# Patient Record
Sex: Female | Born: 1965 | Race: Black or African American | Hispanic: No | Marital: Single | Smoking: Current every day smoker
Health system: Southern US, Community
[De-identification: ages and names within clinical notes are randomized; demographics above are authoritative.]

## PROBLEM LIST (undated history)

## (undated) DIAGNOSIS — R0789 Other chest pain: Secondary | ICD-10-CM

## (undated) DIAGNOSIS — I447 Left bundle-branch block, unspecified: Secondary | ICD-10-CM

## (undated) DIAGNOSIS — I10 Essential (primary) hypertension: Secondary | ICD-10-CM

## (undated) DIAGNOSIS — Z91199 Patient's noncompliance with other medical treatment and regimen due to unspecified reason: Secondary | ICD-10-CM

## (undated) DIAGNOSIS — I471 Supraventricular tachycardia: Principal | ICD-10-CM

## (undated) DIAGNOSIS — F419 Anxiety disorder, unspecified: Secondary | ICD-10-CM

## (undated) DIAGNOSIS — E785 Hyperlipidemia, unspecified: Secondary | ICD-10-CM

## (undated) DIAGNOSIS — R7989 Other specified abnormal findings of blood chemistry: Secondary | ICD-10-CM

## (undated) DIAGNOSIS — Z9119 Patient's noncompliance with other medical treatment and regimen: Secondary | ICD-10-CM

## (undated) DIAGNOSIS — I201 Angina pectoris with documented spasm: Secondary | ICD-10-CM

## (undated) HISTORY — DX: Patient's noncompliance with other medical treatment and regimen: Z91.19

## (undated) HISTORY — DX: Anxiety disorder, unspecified: F41.9

## (undated) HISTORY — DX: Patient's noncompliance with other medical treatment and regimen due to unspecified reason: Z91.199

## (undated) HISTORY — DX: Hyperlipidemia, unspecified: E78.5

## (undated) HISTORY — DX: Other chest pain: R07.89

## (undated) HISTORY — DX: Essential (primary) hypertension: I10

## (undated) HISTORY — PX: TUBAL LIGATION: SHX77

## (undated) HISTORY — DX: Left bundle-branch block, unspecified: I44.7

## (undated) HISTORY — DX: Supraventricular tachycardia: I47.1

---

## 1997-09-07 ENCOUNTER — Emergency Department (HOSPITAL_COMMUNITY): Admission: EM | Admit: 1997-09-07 | Discharge: 1997-09-07 | Payer: Self-pay | Admitting: Emergency Medicine

## 2004-01-16 ENCOUNTER — Ambulatory Visit: Payer: Self-pay | Admitting: Family Medicine

## 2004-02-05 ENCOUNTER — Ambulatory Visit: Payer: Self-pay | Admitting: Family Medicine

## 2004-03-15 ENCOUNTER — Ambulatory Visit: Payer: Self-pay | Admitting: *Deleted

## 2004-05-09 ENCOUNTER — Emergency Department (HOSPITAL_COMMUNITY): Admission: EM | Admit: 2004-05-09 | Discharge: 2004-05-09 | Payer: Self-pay | Admitting: Emergency Medicine

## 2004-09-23 ENCOUNTER — Ambulatory Visit: Payer: Self-pay | Admitting: Internal Medicine

## 2004-10-03 ENCOUNTER — Ambulatory Visit: Payer: Self-pay | Admitting: Internal Medicine

## 2005-01-29 ENCOUNTER — Ambulatory Visit: Payer: Self-pay | Admitting: Family Medicine

## 2005-02-26 ENCOUNTER — Ambulatory Visit: Payer: Self-pay | Admitting: Family Medicine

## 2005-02-27 ENCOUNTER — Encounter (INDEPENDENT_AMBULATORY_CARE_PROVIDER_SITE_OTHER): Payer: Self-pay | Admitting: Family Medicine

## 2005-02-27 LAB — CONVERTED CEMR LAB

## 2005-03-26 ENCOUNTER — Ambulatory Visit (HOSPITAL_COMMUNITY): Admission: RE | Admit: 2005-03-26 | Discharge: 2005-03-26 | Payer: Self-pay | Admitting: Family Medicine

## 2005-07-13 ENCOUNTER — Emergency Department (HOSPITAL_COMMUNITY): Admission: EM | Admit: 2005-07-13 | Discharge: 2005-07-13 | Payer: Self-pay | Admitting: Emergency Medicine

## 2005-09-22 ENCOUNTER — Inpatient Hospital Stay (HOSPITAL_COMMUNITY): Admission: EM | Admit: 2005-09-22 | Discharge: 2005-09-23 | Payer: Self-pay | Admitting: Emergency Medicine

## 2005-09-22 ENCOUNTER — Ambulatory Visit: Payer: Self-pay | Admitting: Family Medicine

## 2005-09-25 ENCOUNTER — Ambulatory Visit: Payer: Self-pay | Admitting: Family Medicine

## 2005-12-05 ENCOUNTER — Ambulatory Visit: Payer: Self-pay | Admitting: Family Medicine

## 2006-02-23 ENCOUNTER — Ambulatory Visit: Payer: Self-pay | Admitting: Family Medicine

## 2006-05-06 ENCOUNTER — Ambulatory Visit: Payer: Self-pay | Admitting: Family Medicine

## 2006-09-23 ENCOUNTER — Ambulatory Visit: Payer: Self-pay | Admitting: Internal Medicine

## 2006-11-13 ENCOUNTER — Emergency Department (HOSPITAL_COMMUNITY): Admission: EM | Admit: 2006-11-13 | Discharge: 2006-11-13 | Payer: Self-pay | Admitting: Emergency Medicine

## 2006-12-09 ENCOUNTER — Encounter (INDEPENDENT_AMBULATORY_CARE_PROVIDER_SITE_OTHER): Payer: Self-pay | Admitting: *Deleted

## 2007-01-22 ENCOUNTER — Encounter (INDEPENDENT_AMBULATORY_CARE_PROVIDER_SITE_OTHER): Payer: Self-pay | Admitting: Family Medicine

## 2007-01-22 DIAGNOSIS — I10 Essential (primary) hypertension: Secondary | ICD-10-CM | POA: Insufficient documentation

## 2007-01-22 DIAGNOSIS — D509 Iron deficiency anemia, unspecified: Secondary | ICD-10-CM | POA: Insufficient documentation

## 2007-01-22 DIAGNOSIS — N739 Female pelvic inflammatory disease, unspecified: Secondary | ICD-10-CM | POA: Insufficient documentation

## 2007-01-22 DIAGNOSIS — B75 Trichinellosis: Secondary | ICD-10-CM

## 2007-01-22 DIAGNOSIS — J45909 Unspecified asthma, uncomplicated: Secondary | ICD-10-CM | POA: Insufficient documentation

## 2007-04-23 ENCOUNTER — Ambulatory Visit: Payer: Self-pay | Admitting: Internal Medicine

## 2007-04-23 LAB — CONVERTED CEMR LAB
ALT: 14 units/L (ref 0–35)
AST: 18 units/L (ref 0–37)
Alkaline Phosphatase: 68 units/L (ref 39–117)
Basophils Absolute: 0.1 10*3/uL (ref 0.0–0.1)
CO2: 27 meq/L (ref 19–32)
Chloride: 103 meq/L (ref 96–112)
Creatinine, Ser: 0.88 mg/dL (ref 0.40–1.20)
HCT: 39.6 % (ref 36.0–46.0)
Helicobacter Pylori Antibody-IgG: <0.4
Hemoglobin: 12.3 g/dL (ref 12.0–15.0)
Lymphocytes Relative: 24 % (ref 12–46)
Monocytes Relative: 8 % (ref 3–12)
Neutrophils Relative %: 64 % (ref 43–77)
Platelets: 344 10*3/uL (ref 150–400)
RDW: 16.7 % — ABNORMAL HIGH (ref 11.5–15.5)
Total Bilirubin: 0.2 mg/dL — ABNORMAL LOW (ref 0.3–1.2)

## 2007-05-19 ENCOUNTER — Encounter (INDEPENDENT_AMBULATORY_CARE_PROVIDER_SITE_OTHER): Payer: Self-pay | Admitting: Family Medicine

## 2007-05-19 ENCOUNTER — Ambulatory Visit: Payer: Self-pay | Admitting: Internal Medicine

## 2007-05-19 LAB — CONVERTED CEMR LAB
Calcium: 9.5 mg/dL (ref 8.4–10.5)
Chloride: 102 meq/L (ref 96–112)

## 2007-06-02 ENCOUNTER — Ambulatory Visit: Payer: Self-pay | Admitting: Internal Medicine

## 2007-06-13 ENCOUNTER — Emergency Department (HOSPITAL_COMMUNITY): Admission: EM | Admit: 2007-06-13 | Discharge: 2007-06-13 | Payer: Self-pay | Admitting: Emergency Medicine

## 2007-06-16 ENCOUNTER — Ambulatory Visit: Payer: Self-pay | Admitting: Internal Medicine

## 2008-02-09 ENCOUNTER — Emergency Department (HOSPITAL_COMMUNITY): Admission: EM | Admit: 2008-02-09 | Discharge: 2008-02-09 | Payer: Self-pay | Admitting: Emergency Medicine

## 2009-04-05 ENCOUNTER — Ambulatory Visit: Payer: Self-pay | Admitting: Internal Medicine

## 2009-10-23 ENCOUNTER — Ambulatory Visit: Payer: Self-pay | Admitting: Internal Medicine

## 2009-10-23 LAB — CONVERTED CEMR LAB
AST: 27 units/L (ref 0–37)
Alkaline Phosphatase: 84 units/L (ref 39–117)
CO2: 25 meq/L (ref 19–32)
Chloride: 103 meq/L (ref 96–112)
Creatinine, Ser: 0.79 mg/dL (ref 0.40–1.20)
Glucose, Bld: 101 mg/dL — ABNORMAL HIGH (ref 70–99)
Potassium: 3.3 meq/L — ABNORMAL LOW (ref 3.5–5.3)
Sodium: 139 meq/L (ref 135–145)
TSH: 3.634 microintl units/mL (ref 0.350–4.500)
Total Bilirubin: 0.4 mg/dL (ref 0.3–1.2)

## 2009-10-25 ENCOUNTER — Ambulatory Visit: Payer: Self-pay | Admitting: Internal Medicine

## 2009-10-26 ENCOUNTER — Ambulatory Visit: Payer: Self-pay | Admitting: Internal Medicine

## 2009-11-19 ENCOUNTER — Inpatient Hospital Stay (HOSPITAL_COMMUNITY): Admission: EM | Admit: 2009-11-19 | Discharge: 2009-11-21 | Payer: Self-pay | Admitting: Emergency Medicine

## 2009-12-18 ENCOUNTER — Encounter (INDEPENDENT_AMBULATORY_CARE_PROVIDER_SITE_OTHER): Payer: Self-pay | Admitting: Internal Medicine

## 2009-12-18 LAB — CONVERTED CEMR LAB
BUN: 18 mg/dL (ref 6–23)
Calcium: 9.3 mg/dL (ref 8.4–10.5)
Chloride: 104 meq/L (ref 96–112)
Creatinine, Ser: 1.02 mg/dL (ref 0.40–1.20)
Sodium: 139 meq/L (ref 135–145)

## 2010-02-18 ENCOUNTER — Emergency Department (HOSPITAL_COMMUNITY): Admission: EM | Admit: 2010-02-18 | Discharge: 2010-02-18 | Payer: Self-pay | Admitting: Emergency Medicine

## 2010-06-07 LAB — BASIC METABOLIC PANEL
BUN: 14 mg/dL (ref 6–23)
CO2: 23 mEq/L (ref 19–32)
CO2: 24 mEq/L (ref 19–32)
Calcium: 9 mg/dL (ref 8.4–10.5)
GFR calc Af Amer: >60 mL/min (ref >60)
GFR calc Af Amer: >60 mL/min (ref >60)
GFR calc non Af Amer: >60 mL/min (ref >60)
GFR calc non Af Amer: >60 mL/min (ref >60)
GFR calc non Af Amer: >60 mL/min (ref >60)
Glucose, Bld: 107 mg/dL — ABNORMAL HIGH (ref 70–99)
Glucose, Bld: 92 mg/dL (ref 70–99)
Potassium: 2.9 mEq/L — ABNORMAL LOW (ref 3.5–5.1)
Sodium: 137 mEq/L (ref 135–145)
Sodium: 139 mEq/L (ref 135–145)

## 2010-06-07 LAB — DIFFERENTIAL
Band Neutrophils: 0 % (ref 0–10)
Blasts: 0 %
Eosinophils Relative: 2 % (ref 0–5)
Lymphocytes Relative: 21 % (ref 12–46)
Lymphs Abs: 1.4 10*3/uL (ref 0.7–4.0)
Monocytes Relative: 5 % (ref 3–12)
Promyelocytes Absolute: 0 %

## 2010-06-07 LAB — URINALYSIS, ROUTINE W REFLEX MICROSCOPIC
Bilirubin Urine: NEGATIVE
Ketones, ur: NEGATIVE mg/dL
Nitrite: NEGATIVE
Protein, ur: NEGATIVE mg/dL
Specific Gravity, Urine: 1.012 (ref 1.005–1.030)
Urobilinogen, UA: 0.2 mg/dL (ref 0.0–1.0)
pH: 7 (ref 5.0–8.0)

## 2010-06-07 LAB — CBC
HCT: 30 % — ABNORMAL LOW (ref 36.0–46.0)
Hemoglobin: 10.7 g/dL — ABNORMAL LOW (ref 12.0–15.0)
Hemoglobin: 9.5 g/dL — ABNORMAL LOW (ref 12.0–15.0)
MCH: 21.8 pg — ABNORMAL LOW (ref 26.0–34.0)
MCV: 69.4 fL — ABNORMAL LOW (ref 78.0–100.0)
Platelets: 222 10*3/uL (ref 150–400)
Platelets: 302 10*3/uL (ref 150–400)
RDW: 19 % — ABNORMAL HIGH (ref 11.5–15.5)
RDW: 19.1 % — ABNORMAL HIGH (ref 11.5–15.5)
WBC: 5.2 10*3/uL (ref 4.0–10.5)

## 2010-08-09 NOTE — Discharge Summary (Signed)
[name], [name] NO.:  [id number]   MEDICAL RECORD NO.:  [id number]          PATIENT TYPE:  INP   LOCATION:  4733                         FACILITY:  [facility]   PHYSICIAN:  Santiago Bumpers. Hensel, M.D.DATE OF BIRTH:  03-Aug-1965   DATE OF ADMISSION:  09/22/2005  DATE OF DISCHARGE:  09/23/2005                                 DISCHARGE SUMMARY   DISCHARGE DIAGNOSES:  1.  Epistaxis.  2.  Uncontrolled hypertension secondary noncompliance.  3.  Hyperkalemia.  4.  Seasonal allergies.  5.  Gastroesophageal reflux disease.   BRIEF HISTORY AND PHYSICAL:  This is a 45 year old African American female  with a history of hypertension and noncompliance with her medication who  presented with spontaneous left-sided epistaxis.  Her blood pressure in the  emergency room was 234/133 at presentation.  She was given clonidine and  labetalol over a 4 hour period and her blood pressure decreased to 180/102  and her nose was packed, at which point her nosebleed decreased to a slow  ooze and was subsequently packed.  She was admitted for 24 hour observation  for better blood pressure control and to ensure hemostasis of her nosebleed.   CONSULTATIONS:  None.   PROCEDURES:  Nasal packing in the emergency department.   PERTINENT LABS AT ADMISSION:  Hemoglobin was 9.2, hematocrit was 30.2,  sodium was 139, potassium 3, chloride 107, bicarb 24, BUN 12, creatinine  0.8, glucose 93, calcium 8.9.  Coags were within normal limits.   INSTRUCTIONS TO THE PATIENT:  No restrictions were given to her on diet or  activity, but it was stressed to her the importance that she follow up with  Dr. Margarette Canada at [facility] and especially follow up for her eligibility  renewal appointment on July 9 at 12:15 p.m.  She is to follow up with Dr.  Margarette Canada at 10:45 a.m. on Thursday the 5th.  It was also stressed to her the  importance of keeping her blood pressure under control with her blood  pressure  medications.   DISCHARGE MEDICATIONS:  1.  Norvasc 5 mg p.o. daily.  2.  Hydrochlorothiazide 25 mg p.o. daily.  3.  Allegra 180 mg p.o. daily.  4.  Protonix 40 mg p.o. daily.  5.  Nasonex as directed on the bottle.   HOSPITAL COURSE BY PROBLEM:  1.  Epistaxis.  Patient's nose was packed in the emergency department and      left in 24 hours.  It was removed 3 hours prior to discharge without      incident or further bleeding.  Patient was instructed about the      importance of her blood pressure management.  2.  Hypertension, poor controlled blood pressure secondary to financial      reasons.  Patient was not taking her home medications which included      clonidine, metoprolol, and hydrochlorothiazide for one month.  She was      given clonidine and labetalol in the ED, which decreased her blood      pressure from 234/133 to 180/102.  She was continued on her  home regimen      on the floor with blood pressures in the 140s/80s on the day of      discharge.  With such a quick response to her home regimen, I suspect      her poorly controlled status is mostly due to not taking her meds and      given the rebound that can be caused by clonidine, we opted to change      her home regimen to Norvasc and hydrochlorothiazide, which will be      cheaper for her and are both on the formulary at [facility].  The      importance of her blood pressure control was once again stressed to her      and appointments were made for her with [facility] for follow up.  3.  Hypokalemia.  Asymptomatic hypokalemia.  She was repleted with 200 mEq      total of K-Dur p.o. to increase her potassium from 3 to 3.5.  She will      need to have her potassium rechecked at her followup later this week.     ______________________________  Lupita Raider, M.D.    ______________________________  Santiago Bumpers. Leveda Anna, M.D.    KS/MEDQ  D:  09/23/2005  T:  09/24/2005  Job:  [id number]

## 2010-08-09 NOTE — H&P (Signed)
NAMEMarland Kitchen  [name], [name] NO.:  [id number]   MEDICAL RECORD NO.:  [id number]          PATIENT TYPE:  INP   LOCATION:  4733                         FACILITY:  [facility]   PHYSICIAN:  Levander Campion, M.D.  DATE OF BIRTH:  [birth year]   DATE OF ADMISSION:  09/22/2005  DATE OF DISCHARGE:                                HISTORY & PHYSICAL   SERVICE:  Family practice teaching service.   CHIEF COMPLAINT:  A spontaneous nosebleed secondary to hypertensive crisis.   HISTORY OF PRESENT ILLNESS:  [name] is a 45 year old female with history  of hypertension who presents with spontaneous left-sided epistaxis that  began at 4:00 p.m. on September 22, 2005.  The bleeding first began as a trickle  and then became a continuous stream.  The patient was otherwise asymptomatic  at the time.  The patient denies headache, chest pain, blurry vision, or  nausea and vomiting.  The patient was given clonidine and labetalol at the  [facility] emergency department secondary to having a blood pressure on  admission of 234/133.  The patient's blood pressure dropped over the course  of 4 hours to 180/102, and her nosebleed decreased back to a slow ooze.  In  the ED, nasal packing was applied.  The patient had not been taking her  blood pressure medications for one month secondary to financial problems.   PAST MEDICAL HISTORY:  1.  Hypertension  2.  Seasonal allergies.  3.  GERD.   MEDICATIONS:  1.  Clonidine 0.2 mg p.o. b.i.d.  2.  Metoprolol 50 mg p.o. b.i.d.  3.  HCTZ 25 mg p.o. daily.  4.  Allegra 180 mg p.o. daily.  5.  Protonix 40 mg p.o. daily.  6.  Nasonex p.r.n.   ALLERGIES:  PENICILLIN.   SOCIAL HISTORY:  The patient lives in [place] with her daughter and  grandson.  She works as a Tour manager.  [name] is her  primary care [name] at [facility].  The patient denies tobacco  use.  The patient reports drinking one beer on week days but does admit to  binge drinking on weekends.  Denies illicit drug use.   FAMILY HISTORY:  Mother is deceased from hypertension and has had four to  six strokes.  Father's history is unknown.  Paternal grandparents are both  deceased and had hypertension and MIs.  She has a brother who died at 20 of  a heart condition.   REVIEW OF SYSTEMS:  CONSTITUTIONAL:  No fever, no chills, no weight loss, no  dizziness.  HEENT:  Positive sinus pressure on left side has been present  for months and is unchanged today.  Positive nasal congestion.  No sore  throat.  No visual symptoms.  SKIN:  No rash.  CARDIOVASCULAR:  No chest  pain, no edema.  PULMONARY:  No shortness of breath, no cough.  ABDOMEN:  No  nausea, vomiting, constipation, diarrhea, abdominal pain, or changes in  bowel habits.  EXTREMITIES:  No edema and no musculoskeletal pain.  NEURO:  No weakness.   PHYSICAL  EXAMINATION:  VITALS:  Temperature 99.1, pulse is 98, blood  pressure is 162/88, respirations 20, sats 99% on room air.  GENERAL:  Anxious, obese female.  HEENT:  Pupils equally round and reactive to light and accommodation.  Extraocular muscles intact.  Sclerae clear.  Nares:  Anterior left naris  mucosa with slight trickle of blood.  Bleeding appears to be very  superficial.  There was no drainage to the mouth.  Oropharynx is clear with  no blood present.  NECK:  Supple and nontender.  Thyroid within normal limits.  CARDIOVASCULAR:  Regular rate and rhythm with no murmurs, rubs, or gallops.  No lower extremity edema.  Two plus distal pulses.  PULMONARY:  Clear to auscultation bilaterally with no wheezes, rales or  retractions.  ABDOMEN:  Soft, nontender, and nondistended.  Positive bowel sounds.  No  rebound, no guarding.  EXTREMITIES:  No edema.  Strength 5 out of 5 in all 4 extremities.  NEUROLOGIC:  Cranial nerves II-XII intact.  No weakness.  Sensation is  intact bilaterally.  DTRs 2+ bilaterally.   LABORATORIES:  PT was within normal  limits at 13.6, INR 1, PTT 30.  Point of  care labs:  Sodium 139, potassium 3, chloride 105, BUN 16, glucose 83.  Hemoglobin 13.3, hematocrit 39.  Creatinine 1.   ASSESSMENT/PLAN:  [name] is a 45 year old female with history of  uncontrolled hypertension who presents with a hypertensive crisis and  spontaneous nosebleed.  1.  Hypertension.  Her pressure is down in the ED on admission to 162/88      after several doses of labetalol and 1 dose of clonidine.  We plan to      restart her home meds and observe her pressure.  We will consult social      work for help with her medication costs.  We will check a UA, a complete      metabolic panel, fasting lipid panel, and a chest x-ray in the morning.  2.  Nosebleed.  Nasal packing was applied in the ED.  We will leave it in      for 24 to 48 hours.  We will check CBC in the a.m.  3.  Decreased potassium.  Replaced in the ED, but we will recheck CMET in      the a.m.  4.  Prophylaxis.  Because the patient has nasal packing in place, we will      start amoxicillin 500 mg q.12 hours x 3 days and will place the patient      on Protonix 40 mg p.o. daily.           ______________________________  Levander Campion, M.D.     JH/MEDQ  D:  09/23/2005  T:  09/23/2005  Job:  [id number]

## 2010-10-22 ENCOUNTER — Emergency Department (HOSPITAL_COMMUNITY): Payer: Self-pay

## 2010-10-22 ENCOUNTER — Observation Stay (HOSPITAL_COMMUNITY)
Admission: EM | Admit: 2010-10-22 | Discharge: 2010-10-24 | Disposition: A | Discharge status: Home / Self Care | Payer: Self-pay | Attending: Cardiology | Admitting: Cardiology

## 2010-10-22 DIAGNOSIS — Z9119 Patient's noncompliance with other medical treatment and regimen: Secondary | ICD-10-CM | POA: Insufficient documentation

## 2010-10-22 DIAGNOSIS — Z91199 Patient's noncompliance with other medical treatment and regimen due to unspecified reason: Secondary | ICD-10-CM | POA: Insufficient documentation

## 2010-10-22 DIAGNOSIS — R0602 Shortness of breath: Secondary | ICD-10-CM | POA: Insufficient documentation

## 2010-10-22 DIAGNOSIS — I447 Left bundle-branch block, unspecified: Secondary | ICD-10-CM | POA: Insufficient documentation

## 2010-10-22 DIAGNOSIS — I4892 Unspecified atrial flutter: Secondary | ICD-10-CM | POA: Insufficient documentation

## 2010-10-22 DIAGNOSIS — I471 Supraventricular tachycardia, unspecified: Secondary | ICD-10-CM

## 2010-10-22 DIAGNOSIS — E785 Hyperlipidemia, unspecified: Secondary | ICD-10-CM | POA: Insufficient documentation

## 2010-10-22 DIAGNOSIS — I119 Hypertensive heart disease without heart failure: Principal | ICD-10-CM | POA: Insufficient documentation

## 2010-10-22 HISTORY — DX: Supraventricular tachycardia, unspecified: I47.10

## 2010-10-22 HISTORY — DX: Supraventricular tachycardia: I47.1

## 2010-10-22 LAB — DIFFERENTIAL
Eosinophils Absolute: 0.4 10*3/uL (ref 0.0–0.7)
Lymphocytes Relative: 25 % (ref 12–46)
Lymphs Abs: 2.6 10*3/uL (ref 0.7–4.0)
Monocytes Absolute: 0.9 10*3/uL (ref 0.1–1.0)
Neutro Abs: 6.4 10*3/uL (ref 1.7–7.7)
Neutrophils Relative %: 62 % (ref 43–77)

## 2010-10-22 LAB — BASIC METABOLIC PANEL
BUN: 20 mg/dL (ref 6–23)
CO2: 22 mEq/L (ref 19–32)
Calcium: 10.2 mg/dL (ref 8.4–10.5)
Glucose, Bld: 121 mg/dL — ABNORMAL HIGH (ref 70–99)
Potassium: 3.3 mEq/L — ABNORMAL LOW (ref 3.5–5.1)
Sodium: 137 mEq/L (ref 135–145)

## 2010-10-22 LAB — CBC
MCHC: 33.5 g/dL (ref 30.0–36.0)
RBC: 5.03 MIL/uL (ref 3.87–5.11)

## 2010-10-22 LAB — CK TOTAL AND CKMB (NOT AT ARMC)
CK, MB: 2.2 ng/mL (ref 0.3–4.0)
Relative Index: INVALID (ref 0.0–2.5)
Total CK: 71 U/L (ref 7–177)

## 2010-10-23 LAB — CK TOTAL AND CKMB (NOT AT ARMC)
CK, MB: 2.2 ng/mL (ref 0.3–4.0)
Relative Index: INVALID (ref 0.0–2.5)

## 2010-10-23 LAB — HEMOGLOBIN A1C
Hgb A1c MFr Bld: 5.5 % (ref <5.7)
Mean Plasma Glucose: 111 mg/dL (ref <117)

## 2010-10-23 LAB — LIPID PANEL
LDL Cholesterol: 137 mg/dL — ABNORMAL HIGH (ref 0–99)
VLDL: 26 mg/dL (ref 0–40)

## 2010-10-23 LAB — TSH: TSH: 5.665 u[IU]/mL — ABNORMAL HIGH (ref 0.350–4.500)

## 2010-10-23 LAB — CARDIAC PANEL(CRET KIN+CKTOT+MB+TROPI)
CK, MB: 2.5 ng/mL (ref 0.3–4.0)
Total CK: 64 U/L (ref 7–177)
Troponin I: <0.3 ng/mL (ref <0.30)

## 2010-10-24 LAB — BASIC METABOLIC PANEL
Calcium: 9.8 mg/dL (ref 8.4–10.5)
Creatinine, Ser: 1 mg/dL (ref 0.50–1.10)
GFR calc Af Amer: >60 mL/min (ref >60)
GFR calc non Af Amer: 60 mL/min — ABNORMAL LOW (ref >60)

## 2010-11-06 ENCOUNTER — Encounter: Payer: Self-pay | Admitting: *Deleted

## 2010-11-06 NOTE — Discharge Summary (Signed)
[name], [name] NO.:  [id number]  MEDICAL RECORD NO.:  [id number]  LOCATION:  3729                         FACILITY:  [facility]  PHYSICIAN:  Pamella Pert, MD DATE OF BIRTH:  1965-07-06  DATE OF ADMISSION:  10/22/2010 DATE OF DISCHARGE:  10/24/2010                              DISCHARGE SUMMARY   DISCHARGE DIAGNOSES: 1. Hypertension with hypertensive heart disease. 2. 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 3. Left bundle-branch block.  RECOMMENDATIONS:  The patient will be discharged home today.  DISCHARGE MEDICATIONS: 1. Lisinopril 40 mg p.o. daily. 2. Iron sulfate 325 mg p.o. daily. 3. Chlorthalidone 25 mg p.o. daily. 4. Coreg 6.25 mg p.o. b.i.d. 5. Aldactone 25 mg p.o. daily. 6. Aspirin 81 mg p.o. daily. 7. Verapamil SR 240 mg p.o. daily.  She will follow up with me in 2 weeks in the office.  BRIEF HISTORY:  Ms. [name] is a 45 year old female who has longstanding history of hypertension but has been noncompliant with medication.  She was admitted via emergency department when she presented with complaints of palpitations.  This was associated with chest discomfort and shortness of breath.  On presentation to the emergency department, she was found to be in atrial flutter with 2:1 conduction, which spontaneously reverted back to sinus rhythm.  She was ruled out for myocardial infarction.  She remained stable and had no more recurrence of atrial flutter.  I felt that the atrial flutter with rapid ventricular response was related to uncontrolled hypertension and hypertensive heart disease.  LABORATORY DATA:  Performed in the hospital included a BMP which revealed serum creatinine to be normal with a BUN of 19, creatinine of 1.0.  Cardiac markers were negative for myocardial injury.  TSH was slightly elevated at 5.66.  She will need followup of the same. Hemoglobin  A1c was normal at 5.5.  Her lipid profile revealed total cholesterol 224, triglycerides 131, HDL 61, LDL was 137.  An echocardiogram that was done October 23, 2010, revealed low normal LV systolic function.  The left ventricular cavity size in the upper limit of normal.  There is severe left ventricular hypertrophy with grade 2 diastolic dysfunction.  Left atrium was moderately dilated.  No significant valve abnormalities were detected.  On the day of discharge, she was felt to be stable with potassium being 3.3, which is being replaced prior to her discharge.  On the day of discharge, she was felt to be stayed stable with temperature of 97.9, pulse was 74 beats per minutes, respirations 14, blood pressure 138/93 mmHg.  RECOMMENDATIONS:  The patient advised to follow up with me in 2 weeks. She is also advised to follow up with [facility], which she has been following.  She needs a TSH reevaluation in about a month or 2 along with followup of her blood pressure.  I will be happy to see her back in the office in couple weeks for better control of hypertension.  DISCHARGE DIAGNOSES: 1. Atrial flutter with 2:1 conduction, which resolved without any     recurrence and presently stable on medications. 2. Hypertension, uncontrolled with hypertensive heart  disease without     any congestive heart failure. 3. Hyperlipidemia. 4. Abnormal TSH at 5.66.     Pamella Pert, MD     JRG/MEDQ  D:  10/24/2010  T:  10/24/2010  Job:  [id number]  Electronically Signed by Yates Decamp MD on 11/06/2010 06:39:40 PM

## 2010-11-06 NOTE — H&P (Signed)
[name], [name] NO.:  [id number]  MEDICAL RECORD NO.:  [id number]  LOCATION:  3729                         FACILITY:  [facility]  PHYSICIAN:  Pamella Pert, MD DATE OF BIRTH:  04/12/1965  DATE OF ADMISSION:  10/22/2010 DATE OF DISCHARGE:                             HISTORY & PHYSICAL   ADMISSION DIAGNOSES: 1. Atrial flutter with 2:1 conduction. 2. Palpitations secondary to atrial flutter with 2:1 conduction due to     uncontrolled hypertension. 3. Obesity. 4. Left bundle-branch block.  HISTORY:  Ms. [name] is a 45 year old female with history of noncompliance with medications with multiple admissions in the past for hypertension and hypertensive emergency, who presented to the [facility] Emergency Department complaining of palpitations.  History dates back to greater than a year ago where she noted occasional palpitations.  This was in the form of rapid heartbeat, associated chest discomfort,shortness of breath but would last only for a minute or two, but, however, over the last 2 weeks, she has been having these episodes on a daily basis.  Now, they have been lasting for about 15-20 minutes up to 30 minutes; however, they used to subside.  Yesterday, she had about 2-3 episodes lasting about 15-20 minutes each and 1 episode last almost about 30 minutes.  She thought she was having panic attacks.  This evening when she was sitting at home, she had similar episode where her heart started to raise and she felt chest tightness just similar to the symptoms that she has been having over the last 2 weeks associated with shortness of breath.  This lasted for at least greater than a couple of hours.  She presented to the emergency room because of persistent palpitations and chest discomfort.  In the emergency department, she was found to have wide complex tachycardia.  She spontaneously converted back to regular rhythm and felt better.  I have been consulted to  see her.  Presently, the patient states she feels well and is back to her baseline.  She denies any chest pain, shortness of breath, paroxysmal nocturnal dyspnea, or orthopnea.  No associated symptoms of diaphoresis, nausea, vomiting which is episodes of palpitation, chest discomfort, and shortness of breath.  REVIEW OF SYSTEMS:  She is not a diabetic but has been told that she has hyperglycemia without diagnosis of diabetes.  She has morbid obesity. She has uncontrolled hypertension, has been noncompliant with medications.  No bowel or bladder disturbance.  No neurological weakness.  No headache, nausea, vomiting, or syncope.  No visual disturbances.  Other systems negative.  Her home medications include: 1. Lisinopril 40 mg p.o. daily. 2. Chlorthalidone 25 mg p.o. daily. 3. Iron sulfate 325 mg p.o. daily.  ALLERGIES:  PENICILLIN which causes her to have rash and hives.  Past medical history is significant for: 1. Hypertension. 2. Obesity. 3. Hyperglycemia.  SOCIAL HISTORY:  She is single.  She does not smoke tobacco.  She has been drinking about half a pint of liquor on a daily basis for the last year or more, quit about a week ago.  No history of illicit drug use.  FAMILY HISTORY:  There is no history of premature coronary  artery disease in family.  Mother did have stroke at the age of 60 due to uncontrolled hypertension.  Father is alive and healthy.  No history of diabetes in family.  PHYSICAL EXAMINATION:  GENERAL:  She is moderately built and obese.  She appears to be in no acute distress. VITAL SIGNS:  Temperature of 97.6, pulse was 82 beats per minute and regular, respiration was 14, blood pressure 136/98 mmHg. CARDIAC:  S1 and S2 was normal.  Questionable S4. CHEST:  Clear without any rhonchi. ABDOMEN:  Soft, obese, nontender without any organomegaly.  Bowel sounds heard in all four quadrants. EXTREMITIES:  No edema.  Full range of movements.  Extremity  was nontender.  Peripheral arterial exam was normal.  EKG on admission when she presented with palpitations reveals what appears like a SVT with left bundle-branch block, probably atrial flutter with 2:1 conduction.  After she converted back to sinus rhythm, she was in sinus with left bundle-branch block with secondary ST-T wave changes.  Compared to August 2011 EKG, intraventricular conduction delay is now replaced with left bundle-branch block.  IMPRESSION: 1. Palpitations secondary to atrial flutter with rapid ventricular     response.  There is paroxysmal going on for the past 2 weeks. 2. Left bundle-branch block. 3. Hyperglycemia. 4. Obesity. 5. Noncompliance with medications.  The patient had totally stopped     taking her medications until about 10 days ago she started to take     back her medications due to financial constraints. 6. Excessive alcohol intake, almost had been drinking about half a     pint of liquor every day for the last greater than a year, quit     about 10 days ago.  RECOMMENDATIONS:  I do not suspect this is ventricular tachycardia or unstable angina.  I suspect this is hypertension with hypertensive heart disease, and now she has developed a left bundle-branch block.  Atrial flutter is probably related to the same.  I will admit her.  Evaluate her thyroid function test.  Rule out for myocardial infarction and treat her with dihydropyridine, calcium channel blocker along with a low dose of beta-blocker.  If she continues to have symptomatic atrial flutter, she may need to be on long-term anticoagulation with Coumadin.  We can evaluate her symptoms on aggressive blood pressure medication and medical therapy.  Again, weight loss was stressed to the patient along with complete avoidance of alcohol.  I will also obtain echocardiogram in the morning to evaluate her LV systolic function.  Eventually, she may need some form of stress testing.  I suspect, this  could be done in the outpatient setting.     Pamella Pert, MD     JRG/MEDQ  D:  10/23/2010  T:  10/23/2010  Job:  [id number]  Electronically Signed by Yates Decamp MD on 11/06/2010 06:39:52 PM

## 2010-11-07 ENCOUNTER — Ambulatory Visit (INDEPENDENT_AMBULATORY_CARE_PROVIDER_SITE_OTHER): Payer: Self-pay | Admitting: Cardiology

## 2010-11-07 ENCOUNTER — Encounter: Payer: Self-pay | Admitting: Cardiology

## 2010-11-07 DIAGNOSIS — I10 Essential (primary) hypertension: Secondary | ICD-10-CM

## 2010-11-07 DIAGNOSIS — I447 Left bundle-branch block, unspecified: Secondary | ICD-10-CM

## 2010-11-07 DIAGNOSIS — I471 Supraventricular tachycardia: Secondary | ICD-10-CM

## 2010-11-07 DIAGNOSIS — I498 Other specified cardiac arrhythmias: Secondary | ICD-10-CM

## 2010-11-07 NOTE — Assessment & Plan Note (Signed)
I have reviewed the patient's previous electrocardiogram with Dr. Graciela Husbands. He is most consistent with SVT and not atrial flutter. She has had these despite medical therapy. I will refer to one of our electrophysiologists consideration of ablation. Note previous TSH mildly elevated. We'll repeat.

## 2010-11-07 NOTE — Assessment & Plan Note (Signed)
Given history of left bundle branch block and chest tightness will schedule Myoview.

## 2010-11-07 NOTE — Progress Notes (Signed)
HPI: 45 year old female for evaluation of chest pain and abnormal electrocardiogram. The patient was admitted in early August. She had hypertensive urgency and atrial flutter based on discharge summary. I have reviewed ECG with Dr. Graciela Husbands and appears to be SVT. She was cared for by Dr. Nadara Eaton. TSH 5.665. Cardiac enzymes negative. Echo in August of 2012 showed EF 50-55, severe LVH, moderate LAE. Patient also with h/o noncompliance. Patient does have some dyspnea on exertion but no orthopnea or PND, pedal edema, exertional chest pain or syncope. She has had intermittent palpitations for approximately 8 years. They are sudden in onset ascribed as her heart racing. It lasted approximately 15 minutes and resolved spontaneously. There is associated dyspnea, chest tightness and dizziness. Recently she has noted dizziness name. This has been since blood pressure meds were initiated during her hospitalization in early August.  Current Outpatient Prescriptions  Medication Sig Dispense Refill  . aspirin 81 MG EC tablet Take 81 mg by mouth daily.        . carvedilol (COREG) 6.25 MG tablet Take 6.25 mg by mouth 2 (two) times daily with a meal.        . chlorthalidone (HYGROTON) 25 MG tablet Take 25 mg by mouth daily.        . Iron 66 MG TABS Take 1 tablet by mouth daily.        [name] lisinopril (PRINIVIL,ZESTRIL) 40 MG tablet Take 40 mg by mouth daily.        . potassium chloride SA (K-DUR,KLOR-CON) 20 MEQ tablet Take 20 mEq by mouth daily.        [name] spironolactone (ALDACTONE) 25 MG tablet Take 25 mg by mouth daily.        . verapamil (COVERA HS) 240 MG (CO) 24 hr tablet Take 240 mg by mouth at bedtime.          Allergies  Allergen Reactions  . Penicillins     Past Medical History  Diagnosis Date  . Anxiety   . Hypertension   . SVT (supraventricular tachycardia)   . LBBB (left bundle branch block)   . Noncompliance   . Hyperlipidemia   . Asthma     Past Surgical History  Procedure Date  . Tubal ligation      History   Social History  . Marital Status: Divorced    Spouse Name: N/A    Number of Children: 3  . Years of Education: N/A   Occupational History  .      Unemployed   Social History Main Topics  . Smoking status: Former Smoker    Quit date: 11/06/1996  . Smokeless tobacco: Not on file  . Alcohol Use: Yes     Quit one month ago  . Drug Use: Not on file  . Sexually Active: Not on file   Other Topics Concern  . Not on file   Social History Narrative  . No narrative on file    Family History  Problem Relation Age of Onset  . Stroke Mother     ROS: no fevers or chills, productive cough, hemoptysis, dysphasia, odynophagia, melena, hematochezia, dysuria, hematuria, rash, seizure activity, orthopnea, PND, pedal edema, claudication. Remaining systems are negative.  Physical Exam: General:  Well developed/well nourished in NAD Skin warm/dry Patient not depressed No peripheral clubbing Back-normal HEENT-normal/normal eyelids Neck supple/normal carotid upstroke bilaterally; no bruits; no JVD; no thyromegaly chest - CTA/ normal expansion CV - RRR/normal S1 and S2; no murmurs, rubs or gallops;  PMI nondisplaced  Abdomen -NT/ND, no HSM, no mass, + bowel sounds, no bruit 2+ femoral pulses, no bruits Ext-no edema, chords, 2+ DP Neuro-grossly nonfocal  ECG 10/18/10 - Sinus Tachycardia with LBBB

## 2010-11-07 NOTE — Assessment & Plan Note (Signed)
Blood pressure now controlled but patient having orthostatic symptoms. Discontinue chlorthalidone and potassium. Check potassium and renal function in one week.

## 2010-11-07 NOTE — Patient Instructions (Addendum)
Your physician wants you to follow-up in: 3 MONTHS WITH DR Jens Som  You will receive a reminder letter in the mail two months in advance. If you don't receive a letter, please call our office to schedule the follow-up appointment.   STOP CHLORTHALIDONE  STOP POTASSIUM  Your physician has requested that you have a lexiscan myoview. For further information please visit [email or web address] Please follow instruction sheet, as given.   REFERRAL TO EP FOR POSSIBLE SVT ABLATION  Your physician recommends that you return for lab work in: ONE WEEK

## 2010-11-14 ENCOUNTER — Ambulatory Visit (HOSPITAL_COMMUNITY): Payer: Self-pay | Attending: Cardiology | Admitting: Radiology

## 2010-11-14 ENCOUNTER — Other Ambulatory Visit (INDEPENDENT_AMBULATORY_CARE_PROVIDER_SITE_OTHER): Payer: Self-pay | Admitting: *Deleted

## 2010-11-14 DIAGNOSIS — I447 Left bundle-branch block, unspecified: Secondary | ICD-10-CM

## 2010-11-14 DIAGNOSIS — I498 Other specified cardiac arrhythmias: Secondary | ICD-10-CM

## 2010-11-14 DIAGNOSIS — R0989 Other specified symptoms and signs involving the circulatory and respiratory systems: Secondary | ICD-10-CM

## 2010-11-14 DIAGNOSIS — I4949 Other premature depolarization: Secondary | ICD-10-CM

## 2010-11-14 DIAGNOSIS — R0789 Other chest pain: Secondary | ICD-10-CM

## 2010-11-14 DIAGNOSIS — I471 Supraventricular tachycardia: Secondary | ICD-10-CM

## 2010-11-14 LAB — TSH: TSH: 2.25 u[IU]/mL (ref 0.35–5.50)

## 2010-11-14 LAB — BASIC METABOLIC PANEL
Calcium: 9.8 mg/dL (ref 8.4–10.5)
GFR: 48.72 mL/min — ABNORMAL LOW (ref >60.00)
Glucose, Bld: 93 mg/dL (ref 70–99)
Sodium: 137 mEq/L (ref 135–145)

## 2010-11-14 MED ORDER — ADENOSINE (DIAGNOSTIC) 3 MG/ML IV SOLN
0.5600 mg/kg | Freq: Once | INTRAVENOUS | Status: AC
  Administered 2010-11-14: 44.1 mg via INTRAVENOUS

## 2010-11-14 MED ORDER — TECHNETIUM TC 99M TETROFOSMIN IV KIT
10.5000 | PACK | Freq: Once | INTRAVENOUS | Status: AC | PRN
  Administered 2010-11-14: 11 via INTRAVENOUS

## 2010-11-14 MED ORDER — TECHNETIUM TC 99M TETROFOSMIN IV KIT
33.0000 | PACK | Freq: Once | INTRAVENOUS | Status: AC | PRN
  Administered 2010-11-14: 33 via INTRAVENOUS

## 2010-11-14 NOTE — Progress Notes (Signed)
[facility] SITE 3 NUCLEAR MED [address] [place] [address] [phone]  Cardiology Nuclear Med Study  [name] is a 45 y.o. female [id number] 1965/09/16   Nuclear Med Background Indication for Stress Test:  Evaluation for Ischemia, Post Hospital on 10/24/10: Abnormal EKG, SVT, (-) enzymes History:  Asthma, 8/12 Echo: EF=50-55% Cardiac Risk Factors: History of Smoking, Hypertension, LBBB and Lipids  Symptoms:  Chest Pain,Chest Tightness,and Chest Pressure with and without Exertion (last date of chest discomfort earlier this am), Dizziness, DOE, Fatigue, Fatigue with Exertion, Light-Headedness, Palpitations and Rapid HR   Nuclear Pre-Procedure Caffeine/Decaff Intake:  None NPO After: 3 PM   Lungs:  Clear IV 0.9% NS with Angio Cath:  20g  IV Site: R Antecubital  IV Started by:  Bonnita Levan, RN  Chest Size (in):  38 Cup Size: C  Height: 5\' 1"  (1.549 m)  Weight:  174 lb (78.926 kg)  BMI:  Body mass index is 32.88 kg/(m^2). Tech Comments: Patient held Meds today, Labs drawn. Patient brought Proventil inhaler took  2 sp. prior to lexiscan    Nuclear Med Study 1 or 2 day study: 1 day  Stress Test Type:  Adenosine  Reading MD: Cassell Clement, MD  Order Authorizing Provider:  Dr. Velta Addison  Resting Radionuclide: Technetium 43m Tetrofosmin  Resting Radionuclide Dose: 10.5 mCi   Stress Radionuclide:  Technetium 10m Tetrofosmin  Stress Radionuclide Dose: 33.0 mCi           Stress Protocol Rest HR: 70 Stress HR: 137  Rest BP: 159/108 Stress BP: 161/110  Exercise Time (min): n/a METS: n/a   Predicted Max HR: 174 bpm % Max HR: 78.74 bpm Rate Pressure Product: 21308   Dose of Adenosine (mg):  44.3 Dose of Lexiscan: n/a mg  Dose of Atropine (mg): n/a Dose of Dobutamine: n/a mcg/kg/min (at max HR)  Stress Test Technologist: Irean Hong, RN  Nuclear Technologist:  Doyne Keel, CNMT     Rest Procedure:  Myocardial perfusion imaging was performed  at rest 45 minutes following the intravenous administration of Technetium 63m Tetrofosmin. Rest ECG: NSR with LBBB  Stress Procedure:  The patient received IV adenosine at 140 mcg/kg/min for 4 minutes.  The EKG was non-diagnostic due to baseline LBBB, and LBBB morphology per Dr. Graciela Husbands during adenosine. The patient did not take her BP medication today, and baseline BP was elevated. Technetium 70m Tetrofosmin was injected at the 2 minute mark and quantitative spect images were obtained after a 45 minute delay. Reviewed preliminary EF 40%  with Dr. Graciela Husbands who wants Dr. Jens Som to evaluate. Of note,The patient has an office visit 12/19/10 at 2:30 with Dr. Hillis Range.[name],RN. Stress ECG: Uninteretable due to baseline LBBB  QPS Raw Data Images:  Normal; no motion artifact; normal heart/lung ratio. Stress Images:  Normal homogeneous uptake in all areas of the myocardium. Rest Images:  Normal homogeneous uptake in all areas of the myocardium. Subtraction (SDS):  No evidence of ischemia. Transient Ischemic Dilatation (Normal <1.22):  1.11 Lung/Heart Ratio (Normal <0.45):  0.25  Quantitative Gated Spect Images QGS EDV:  118 ml QGS ESV:  71 ml QGS cine images:  Paradoxical septal motion c/w LBBB QGS EF: 40%  Impression Exercise Capacity:  Adenosine study with no exercise. BP Response:  Normal blood pressure response. Clinical Symptoms:  Significant chest pain. ECG Impression:  Baseline:  LBBB.  EKG uninterpretable due to LBBB at rest and stress. Comparison with Prior Nuclear Study: No previous  nuclear study performed  Overall Impression:  Low risk stress nuclear study.  No evidence of ischemia.  Mild depression of LV systolic function with septal paradoxical motion c/w LBBB.  Cassell Clement

## 2010-11-15 ENCOUNTER — Telehealth: Payer: Self-pay | Admitting: *Deleted

## 2010-11-15 DIAGNOSIS — N289 Disorder of kidney and ureter, unspecified: Secondary | ICD-10-CM

## 2010-11-15 NOTE — Telephone Encounter (Signed)
Message copied by Freddi Starr on Fri Nov 15, 2010  5:03 PM ------      Message from: Lewayne Bunting      Created: Thu Nov 14, 2010  5:01 PM       Dc spironolactone; bmet one week      Olga Millers

## 2010-11-21 ENCOUNTER — Institutional Professional Consult (permissible substitution): Payer: Self-pay | Admitting: Cardiology

## 2010-11-22 ENCOUNTER — Other Ambulatory Visit: Payer: Self-pay | Admitting: *Deleted

## 2010-12-16 LAB — URINALYSIS, ROUTINE W REFLEX MICROSCOPIC
Hgb urine dipstick: NEGATIVE
Nitrite: NEGATIVE
Specific Gravity, Urine: 1.006
Urobilinogen, UA: 0.2

## 2010-12-16 LAB — BASIC METABOLIC PANEL
Calcium: 9.1
GFR calc Af Amer: >60
GFR calc non Af Amer: >60
Glucose, Bld: 90
Sodium: 138

## 2010-12-16 LAB — DIFFERENTIAL
Basophils Absolute: 0
Lymphocytes Relative: 21
Monocytes Absolute: 0.9
Neutro Abs: 6.2

## 2010-12-16 LAB — PREGNANCY, URINE: Preg Test, Ur: NEGATIVE

## 2010-12-16 LAB — CBC
Hemoglobin: 12.2
RDW: 17.2 — ABNORMAL HIGH

## 2010-12-18 ENCOUNTER — Encounter: Payer: Self-pay | Admitting: Internal Medicine

## 2010-12-19 ENCOUNTER — Ambulatory Visit (INDEPENDENT_AMBULATORY_CARE_PROVIDER_SITE_OTHER): Payer: Self-pay | Admitting: Internal Medicine

## 2010-12-19 ENCOUNTER — Encounter: Payer: Self-pay | Admitting: Internal Medicine

## 2010-12-19 VITALS — BP 140/91 | HR 66 | Ht 61.0 in | Wt 181.0 lb

## 2010-12-19 DIAGNOSIS — I447 Left bundle-branch block, unspecified: Secondary | ICD-10-CM

## 2010-12-19 DIAGNOSIS — I10 Essential (primary) hypertension: Secondary | ICD-10-CM

## 2010-12-19 DIAGNOSIS — I471 Supraventricular tachycardia: Secondary | ICD-10-CM

## 2010-12-19 DIAGNOSIS — I498 Other specified cardiac arrhythmias: Secondary | ICD-10-CM

## 2010-12-19 NOTE — Assessment & Plan Note (Signed)
Improved The importance of medical compliance was discussed today.  Complete ETOH avoidance was encouraged.  BMET today.

## 2010-12-19 NOTE — Progress Notes (Signed)
[name] is a pleasant 45 y.o. AAF patient with a h/o LBBB and tachycardia who presents today for EP consultation.  She reports initially having "panic attacks" with symptoms of anxiety and chest discomfort, lasting 15 minutes.  Episodes began to last "longer" and increased in frequency.  She reports presenting to the ER 7/12 with similar symptoms.  She was found to be in a wide complex tachycardia at that time.  Her arrhythmia spontaneously terminated.  She reports that she had been noncompliant with medications and had been drinking heavily at that time.  She is unaware of any other triggers or precipitants. She has been treated with medical therapy since that time and has quit drinking alcohol.  Since that time she has done very well.  She has had no further heart racing since been treated medically by Dr Jens Som.  He stopped chlorthalidone, KCl, and spironolactone.  She reports feeling much better off of these.  She reports occasional chest tightness and mild SOB with moderate activity.    Today, she denies symptoms of orthopnea, PND, lower extremity edema, dizziness, presyncope, syncope, or neurologic sequela. The patient is tolerating medications without difficulties and is otherwise without complaint today.   Past Medical History  Diagnosis Date  . Anxiety   . Hypertension   . SVT (supraventricular tachycardia) 10/22/10    wide complex tachycardia of same QRS as intinsic LBBB  . LBBB (left bundle branch block)   . Noncompliance   . Hyperlipidemia   . Asthma    Past Surgical History  Procedure Date  . Tubal ligation     Current Outpatient Prescriptions  Medication Sig Dispense Refill  . aspirin 81 MG EC tablet Take 81 mg by mouth daily.        . carvedilol (COREG) 6.25 MG tablet Take 6.25 mg by mouth 2 (two) times daily with a meal.        . Iron 66 MG TABS Take 1 tablet by mouth daily.        [name] lisinopril (PRINIVIL,ZESTRIL) 40 MG tablet Take 40 mg by mouth daily.        .  verapamil (COVERA HS) 240 MG (CO) 24 hr tablet Take 240 mg by mouth at bedtime.          Allergies  Allergen Reactions  . Penicillins     History   Social History  . Marital Status: Divorced    Spouse Name: N/A    Number of Children: 3  . Years of Education: N/A   Occupational History  .      Unemployed   Social History Main Topics  . Smoking status: Former Smoker    Quit date: 11/06/1996  . Smokeless tobacco: Not on file  . Alcohol Use: Yes     heavy ETOH (1 pint of liquor most days) until 4 months ago, now drinks 1-2 beers per day  . Drug Use: No     denies prior use  . Sexually Active: Not on file   Other Topics Concern  . Not on file   Social History Narrative   Lives in [place], unemployed.  Single.  3 children ages 64,24,28.    Family History  Problem Relation Age of Onset  . Stroke Mother     ROS- All systems are reviewed and negative except as per the HPI above  Physical Exam: Filed Vitals:   12/19/10 1429  BP: 140/91  Pulse: 66  Height: 5\' 1"  (1.549 m)  Weight: 181  lb (82.101 kg)    GEN- The patient is overweight appearing, alert and oriented x 3 today.   Head- normocephalic, atraumatic Eyes-  Sclera clear, conjunctiva pink Ears- hearing intact Oropharynx- clear Neck- supple, no JVP Lymph- no cervical lymphadenopathy Lungs- Clear to ausculation bilaterally, normal work of breathing Heart- Regular rate and rhythm, no murmurs, rubs or gallops,  Wide S2 split, PMI not laterally displaced GI- soft, NT, ND, + BS Extremities- no clubbing, cyanosis, or edema MS- no significant deformity or atrophy Skin- no rash or lesion Psych- euthymic mood, full affect Neuro- strength and sensation are intact  EKG today reveals sinus rhythm 66 bpm, LBBB with PVCs  Assessment and Plan:

## 2010-12-19 NOTE — Patient Instructions (Signed)
Your physician recommends that you schedule a follow-up appointment in: 3 months with Dr Johney Frame  Low salt diet

## 2010-12-19 NOTE — Assessment & Plan Note (Signed)
I have reviewed the patient's ekg from 10/22/10 which reveals a WCT at 180 bpm, likely SVT with LBB abbarancy. She reports significant improvements with no further episodes of SVT since quitting ETOH and with beta blocker therapy.  Therapeutic strategies for supraventricular tachycardia including medicine and ablation were discussed in detail with the patient today. Risk, benefits, and alternatives to EP study and radiofrequency ablation were also discussed in detail today. At this time, she is clear that she wishes to avoid ablation. The importance of compliance with medical therapies was discussed with the patient. If her SVT frequency worsens, then she will contact my office.  She will follow up with Dr Jens Som.  I will see her again in 3 months.  If doing well at that time, I will likely turn her care back to Dr Jens Som.

## 2010-12-19 NOTE — Assessment & Plan Note (Signed)
Recent low risk myoview reviewed with the patient today. No further workup planned.

## 2010-12-20 LAB — BASIC METABOLIC PANEL
Calcium: 9.1 mg/dL (ref 8.4–10.5)
GFR: 73.17 mL/min (ref >60.00)
Glucose, Bld: 83 mg/dL (ref 70–99)
Potassium: 4.1 mEq/L (ref 3.5–5.1)
Sodium: 141 mEq/L (ref 135–145)

## 2011-01-03 LAB — DIFFERENTIAL
Lymphocytes Relative: 21
Lymphs Abs: 1.2
Monocytes Relative: 10
Neutrophils Relative %: 68

## 2011-01-03 LAB — CBC
HCT: 37.5
Platelets: 283
RBC: 5.02
WBC: 5.6

## 2011-01-03 LAB — BASIC METABOLIC PANEL
BUN: 10
Chloride: 102
Creatinine, Ser: 0.6
GFR calc Af Amer: >60
GFR calc non Af Amer: >60
Potassium: 3.2 — ABNORMAL LOW

## 2011-01-03 LAB — POCT CARDIAC MARKERS: Myoglobin, poc: 37.6

## 2011-01-30 ENCOUNTER — Encounter: Payer: Self-pay | Admitting: Cardiology

## 2011-01-30 NOTE — Progress Notes (Signed)
[id number] female I saw in August of 2012 for evaluation of chest pain and abnormal electrocardiogram. The patient was admitted in early August. She had hypertensive urgency and atrial flutter based on discharge summary. I have reviewed ECG with Dr. Graciela Husbands and appears to be SVT. She was cared for by Dr. Nadara Eaton. TSH 5.665 (fu normal). Cardiac enzymes negative. Echo in August of 2012 showed EF 50-55, severe LVH, moderate LAE. Patient also with h/o noncompliance. Myoview in August of 2012 showed normal perfusion, ejection fraction of 40% and septal motion consistent with bundle branch block. Seen by Dr. Johney Frame for consideration of ablation in September of 2012 but patient elected medical therapy for now. Since then,    Current Outpatient Prescriptions  Medication Sig Dispense Refill  . aspirin 81 MG EC tablet Take 81 mg by mouth daily.        . carvedilol (COREG) 6.25 MG tablet Take 6.25 mg by mouth 2 (two) times daily with a meal.        . Iron 66 MG TABS Take 1 tablet by mouth daily.        [name] lisinopril (PRINIVIL,ZESTRIL) 40 MG tablet Take 40 mg by mouth daily.        . verapamil (COVERA HS) 240 MG (CO) 24 hr tablet Take 240 mg by mouth at bedtime.           Past Medical History  Diagnosis Date  . Anxiety   . Hypertension   . SVT (supraventricular tachycardia) 10/22/10    wide complex tachycardia of same QRS as intinsic LBBB  . LBBB (left bundle branch block)   . Noncompliance   . Hyperlipidemia   . Asthma     Past Surgical History  Procedure Date  . Tubal ligation     History   Social History  . Marital Status: Divorced    Spouse Name: N/A    Number of Children: 3  . Years of Education: N/A   Occupational History  .      Unemployed   Social History Main Topics  . Smoking status: Former Smoker    Quit date: 11/06/1996  . Smokeless tobacco: Not on file  . Alcohol Use: Yes     heavy ETOH (1 pint of liquor most days) until 4 months ago, now drinks 1-2 beers per day  . Drug  Use: No     denies prior use  . Sexually Active: Not on file   Other Topics Concern  . Not on file   Social History Narrative   Lives in [place], unemployed.  Single.  3 children ages 78,24,28.    ROS: no fevers or chills, productive cough, hemoptysis, dysphasia, odynophagia, melena, hematochezia, dysuria, hematuria, rash, seizure activity, orthopnea, PND, pedal edema, claudication. Remaining systems are negative.  Physical Exam: Well-developed well-nourished in no acute distress.  Skin is warm and dry.  HEENT is normal.  Neck is supple. No thyromegaly.  Chest is clear to auscultation with normal expansion.  Cardiovascular exam is regular rate and rhythm.  Abdominal exam nontender or distended. No masses palpated. Extremities show no edema. neuro grossly intact  ECG     This encounter was created in error - please disregard.

## 2011-03-03 ENCOUNTER — Encounter: Payer: Self-pay | Admitting: Cardiology

## 2011-03-03 ENCOUNTER — Ambulatory Visit (INDEPENDENT_AMBULATORY_CARE_PROVIDER_SITE_OTHER): Payer: Self-pay | Admitting: Cardiology

## 2011-03-03 DIAGNOSIS — I10 Essential (primary) hypertension: Secondary | ICD-10-CM

## 2011-03-03 DIAGNOSIS — R079 Chest pain, unspecified: Secondary | ICD-10-CM

## 2011-03-03 DIAGNOSIS — I471 Supraventricular tachycardia: Secondary | ICD-10-CM

## 2011-03-03 DIAGNOSIS — I498 Other specified cardiac arrhythmias: Secondary | ICD-10-CM

## 2011-03-03 DIAGNOSIS — R0789 Other chest pain: Secondary | ICD-10-CM | POA: Insufficient documentation

## 2011-03-03 NOTE — Assessment & Plan Note (Signed)
Blood pressure control with present medications.

## 2011-03-03 NOTE — Assessment & Plan Note (Signed)
Patient's Myoview showed no ischemia. Her LV function was reduced but it was low normal on echocardiogram. This is most likely from hypertension. Plan continue medications for blood pressure control and repeat echocardiogram in the future.

## 2011-03-03 NOTE — Assessment & Plan Note (Signed)
Plan continue present medications. She is scheduled to see Dr. Johney Frame in one week. Given the recurrent nature of her symptoms I think ablation is warranted. She is leaning in that direction.

## 2011-03-03 NOTE — Progress Notes (Signed)
[name] female I saw in August of 2012 for evaluation of chest pain and abnormal electrocardiogram. The patient was admitted in early August. She had hypertensive urgency and atrial flutter based on discharge summary. I have reviewed ECG with Dr. Graciela Husbands and appears to be SVT. She was cared for by Dr. Nadara Eaton. TSH 5.665. Cardiac enzymes negative. Echo in August of 2012 showed EF 50-55, severe LVH, moderate LAE. Patient also with h/o noncompliance. Myoview in August of 2012 showed EF 40 and no ischemia. Seen by Dr Johney Frame and declined ablation. Since I last saw her, she states her symptoms have improved. However she continues to have occasional palpitations. Some of these are related to exertion. She has had 2 episodes at rest. She states her heart races and then she develops dyspnea and chest tightness. One episode lasted 2 hours and one lasted one half hour.   Current Outpatient Prescriptions  Medication Sig Dispense Refill  . aspirin 81 MG EC tablet Take 81 mg by mouth daily.        . carvedilol (COREG) 6.25 MG tablet Take 6.25 mg by mouth 2 (two) times daily with a meal.        . Iron 66 MG TABS Take 1 tablet by mouth daily.        [name] lisinopril (PRINIVIL,ZESTRIL) 40 MG tablet Take 40 mg by mouth daily.        . verapamil (COVERA HS) 240 MG (CO) 24 hr tablet Take 240 mg by mouth at bedtime.           Past Medical History  Diagnosis Date  . Anxiety   . Hypertension   . SVT (supraventricular tachycardia) 10/22/10    wide complex tachycardia of same QRS as intinsic LBBB  . LBBB (left bundle branch block)   . Noncompliance   . Hyperlipidemia   . Asthma     Past Surgical History  Procedure Date  . Tubal ligation     History   Social History  . Marital Status: Divorced    Spouse Name: N/A    Number of Children: 3  . Years of Education: N/A   Occupational History  .      Unemployed   Social History Main Topics  . Smoking status: Former Smoker    Quit date: 11/06/1996  .  Smokeless tobacco: Not on file  . Alcohol Use: Yes     heavy ETOH (1 pint of liquor most days) until 4 months ago, now drinks 1-2 beers per day  . Drug Use: No     denies prior use  . Sexually Active: Not on file   Other Topics Concern  . Not on file   Social History Narrative   Lives in [place], unemployed.  Single.  3 children ages 61,24,28.    ROS: no fevers or chills, productive cough, hemoptysis, dysphasia, odynophagia, melena, hematochezia, dysuria, hematuria, rash, seizure activity, orthopnea, PND, pedal edema, claudication. Remaining systems are negative.  Physical Exam: Well-developed well-nourished in no acute distress.  Skin is warm and dry.  HEENT is normal.  Neck is supple. No thyromegaly.  Chest is clear to auscultation with normal expansion.  Cardiovascular exam is regular rate and rhythm.  Abdominal exam nontender or distended. No masses palpated. Extremities show no edema. neuro grossly intact  ECG NSR, CRO septal MI, inferolateral TWI, LAE

## 2011-03-03 NOTE — Patient Instructions (Signed)
Your physician wants you to follow-up in:  6 months. You will receive a reminder letter in the mail two months in advance. If you don't receive a letter, please call our office to schedule the follow-up appointment.   

## 2011-03-10 ENCOUNTER — Telehealth: Payer: Self-pay

## 2011-03-10 ENCOUNTER — Encounter: Payer: Self-pay | Admitting: Internal Medicine

## 2011-03-10 ENCOUNTER — Ambulatory Visit (INDEPENDENT_AMBULATORY_CARE_PROVIDER_SITE_OTHER): Payer: Self-pay | Admitting: Internal Medicine

## 2011-03-10 DIAGNOSIS — I471 Supraventricular tachycardia: Secondary | ICD-10-CM

## 2011-03-10 DIAGNOSIS — R079 Chest pain, unspecified: Secondary | ICD-10-CM

## 2011-03-10 DIAGNOSIS — I498 Other specified cardiac arrhythmias: Secondary | ICD-10-CM

## 2011-03-10 DIAGNOSIS — R002 Palpitations: Secondary | ICD-10-CM

## 2011-03-10 NOTE — Patient Instructions (Signed)
Your physician recommends that you schedule a follow-up appointment in: 6 weeks with Dr Allred  Your physician has recommended that you wear an event monitor. Event monitors are medical devices that record the heart's electrical activity. Doctors most often us these monitors to diagnose arrhythmias. Arrhythmias are problems with the speed or rhythm of the heartbeat. The monitor is a small, portable device. You can wear one while you do your normal daily activities. This is usually used to diagnose what is causing palpitations/syncope (passing out).    

## 2011-03-10 NOTE — Progress Notes (Signed)
The patient presents today for routine electrophysiology followup.  Since last being seen in our clinic, the patient reports doing reasonably well.  She denies tachypalpitations.   She continues to have occasional chest tightness.  He has stable SOB which improves with bronchodilators. Today, she denies symptoms of palpitations, orthopnea, PND, lower extremity edema, dizziness, presyncope, syncope, or neurologic sequela.  The patient feels that she is tolerating medications without difficulties and is otherwise without complaint today.   Past Medical History  Diagnosis Date  . Anxiety   . Hypertension   . SVT (supraventricular tachycardia) 10/22/10    wide complex tachycardia of same QRS as intinsic LBBB  . LBBB (left bundle branch block)   . Noncompliance   . Hyperlipidemia   . Asthma   . Atypical chest pain     recent low risk myoview   Past Surgical History  Procedure Date  . Tubal ligation     Current Outpatient Prescriptions  Medication Sig Dispense Refill  . aspirin 81 MG EC tablet Take 81 mg by mouth daily.        . carvedilol (COREG) 6.25 MG tablet Take 6.25 mg by mouth 2 (two) times daily with a meal.        . Iron 66 MG TABS Take 1 tablet by mouth daily.        [name] lisinopril (PRINIVIL,ZESTRIL) 40 MG tablet Take 40 mg by mouth daily.        . verapamil (COVERA HS) 240 MG (CO) 24 hr tablet Take 240 mg by mouth at bedtime.          Allergies  Allergen Reactions  . Penicillins     History   Social History  . Marital Status: Divorced    Spouse Name: N/A    Number of Children: 3  . Years of Education: N/A   Occupational History  .      Unemployed   Social History Main Topics  . Smoking status: Former Smoker    Quit date: 11/06/1996  . Smokeless tobacco: Not on file  . Alcohol Use: Yes     heavy ETOH (1 pint of liquor most days) until 4 months ago, now drinks 1-2 beers per day, reports that she has recently quit  . Drug Use: No     denies prior use  . Sexually  Active: Not on file   Other Topics Concern  . Not on file   Social History Narrative   Lives in [place], unemployed.  Single.  3 children ages 61,24,28.    Family History  Problem Relation Age of Onset  . Stroke Mother     ROS-  All systems are reviewed and are negative except as outlined in the HPI above   Physical Exam: Filed Vitals:   03/10/11 0940  BP: 134/98  Pulse: 65  Height: 5\' 1"  (1.549 m)  Weight: 184 lb (83.462 kg)    GEN- The patient is well appearing, alert and oriented x 3 today.   Head- normocephalic, atraumatic Eyes-  Sclera clear, conjunctiva pink Ears- hearing intact Oropharynx- clear Neck- supple, no JVP Lymph- no cervical lymphadenopathy Lungs- Clear to ausculation bilaterally, normal work of breathing Heart- Regular rate and rhythm, no murmurs, rubs or gallops, PMI not laterally displaced Chest wall is moderately ttp GI- soft, NT, ND, + BS Extremities- no clubbing, cyanosis, or edema MS- no significant deformity or atrophy Skin- no rash or lesion Psych- euthymic mood, full affect Neuro- strength and sensation are intact  ekg today reveals sinus rhythm 65 bpm, PR 174, QRS 100, Qtc 465, nonspecific ST/T changes  Assessment and Plan:

## 2011-03-10 NOTE — Telephone Encounter (Signed)
This Monitor will be Mail to Patient after [employer] call her to set up payment arangment  due to no insurance

## 2011-03-10 NOTE — Assessment & Plan Note (Signed)
Atypical in nature Recent myoview was low risk She has chest wall soreness to palpation today  I am not convinced that he symptoms are related to SVT.  I will therefore place an event monitor today.

## 2011-03-10 NOTE — Assessment & Plan Note (Signed)
I have reviewed the patient's ekg from 10/22/10 which reveals a WCT at 180 bpm, likely SVT with LBB abbarancy. She reports significant improvements with no further episodes of SVT since quitting ETOH and with beta blocker therapy.  Event monitor today.  If atypical chest pain correlated with SVT, we will consider ablation, if not then I think that we should continue medical therapy and ETOH avoidance.

## 2011-03-26 ENCOUNTER — Other Ambulatory Visit (HOSPITAL_COMMUNITY): Payer: Self-pay | Admitting: Family Medicine

## 2011-03-26 ENCOUNTER — Other Ambulatory Visit: Payer: Self-pay | Admitting: Family Medicine

## 2011-03-26 DIAGNOSIS — Z1231 Encounter for screening mammogram for malignant neoplasm of breast: Secondary | ICD-10-CM

## 2011-03-27 ENCOUNTER — Ambulatory Visit (HOSPITAL_COMMUNITY)
Admission: RE | Admit: 2011-03-27 | Discharge: 2011-03-27 | Disposition: A | Discharge status: Home / Self Care | Payer: Self-pay | Source: Ambulatory Visit | Attending: Family Medicine | Admitting: Family Medicine

## 2011-03-27 DIAGNOSIS — Z1231 Encounter for screening mammogram for malignant neoplasm of breast: Secondary | ICD-10-CM | POA: Insufficient documentation

## 2011-04-30 ENCOUNTER — Ambulatory Visit: Payer: Self-pay | Admitting: Internal Medicine

## 2011-10-27 ENCOUNTER — Ambulatory Visit (INDEPENDENT_AMBULATORY_CARE_PROVIDER_SITE_OTHER): Payer: Self-pay | Admitting: Internal Medicine

## 2011-10-27 ENCOUNTER — Encounter: Payer: Self-pay | Admitting: Internal Medicine

## 2011-10-27 VITALS — BP 160/101 | HR 74 | Resp 18 | Ht 61.0 in | Wt 198.0 lb

## 2011-10-27 DIAGNOSIS — F1010 Alcohol abuse, uncomplicated: Secondary | ICD-10-CM | POA: Insufficient documentation

## 2011-10-27 DIAGNOSIS — I10 Essential (primary) hypertension: Secondary | ICD-10-CM

## 2011-10-27 DIAGNOSIS — I498 Other specified cardiac arrhythmias: Secondary | ICD-10-CM

## 2011-10-27 DIAGNOSIS — I471 Supraventricular tachycardia: Secondary | ICD-10-CM

## 2011-10-27 DIAGNOSIS — R0789 Other chest pain: Secondary | ICD-10-CM

## 2011-10-27 MED ORDER — CARVEDILOL 12.5 MG PO TABS: ORAL_TABLET | ORAL | Status: DC

## 2011-10-27 NOTE — Assessment & Plan Note (Signed)
Low risk myoview No further workup planned

## 2011-10-27 NOTE — Assessment & Plan Note (Signed)
Appears to be controlled Her event monitor from January did not capture any arrhythmias to correlate with symptoms, though she did not submit any symptomatic events.  Gradual onset/offset tachycardia with exertion is most suggestive of sinus tachycardia. I would favor ongoing medical therapy at this time.

## 2011-10-27 NOTE — Assessment & Plan Note (Signed)
Long term cessation is again advised

## 2011-10-27 NOTE — Patient Instructions (Signed)
Your physician wants you to follow-up in: 6 months with Dr Jacquiline Doe will receive a reminder letter in the mail two months in advance. If you don't receive a letter, please call our office to schedule the follow-up appointment.   Your physician has recommended you make the following change in your medication:  1) Increase your Carvedilol to 12.5mg  twice daily

## 2011-10-27 NOTE — Progress Notes (Signed)
Primary Cardiologist:  Dr Jens Som PCP:  [facility]  The patient presents today for electrophysiology followup.  She has been noncompliant with follow-up.  I saw her last in December at which time an event monitor was placed with plans to see me again in 6 weeks.  She has been noncompliant. She appears to be doing reasonably well.  She reports occasional gradual onset/ offset tachycardia with exercise.  She denies abrupt onset/ offset tachycardia at rest.  She has stable atypical chest pain which is infrequent and has not worsened since her last visit.   He has stable SOB which improves with bronchodilators  She has been unable to lose weight.  She reports that her BP remains chronically elevated. Today, she denies symptoms of palpitations, orthopnea, PND, lower extremity edema, dizziness, presyncope, syncope, or neurologic sequela.  The patient feels that she is tolerating medications without difficulties and is otherwise without complaint today.   Past Medical History  Diagnosis Date  . Anxiety   . Hypertension   . SVT (supraventricular tachycardia) 10/22/10    wide complex tachycardia of same QRS as intinsic LBBB  . LBBB (left bundle branch block)   . Noncompliance   . Hyperlipidemia   . Asthma   . Atypical chest pain     recent low risk myoview   Past Surgical History  Procedure Date  . Tubal ligation     Current Outpatient Prescriptions  Medication Sig Dispense Refill  . aspirin 81 MG EC tablet Take 81 mg by mouth daily.        . carvedilol (COREG) 12.5 MG tablet One by mouth twice daily  60 tablet  11  . Iron 66 MG TABS Take 1 tablet by mouth daily.        [name] lisinopril (PRINIVIL,ZESTRIL) 40 MG tablet Take 40 mg by mouth daily.        . verapamil (COVERA HS) 240 MG (CO) 24 hr tablet Take 240 mg by mouth at bedtime.        [name] DISCONTD: carvedilol (COREG) 6.25 MG tablet Take 6.25 mg by mouth as directed. Take 2 tablets in AM & 1 tablet in PM.        Allergies  Allergen  Reactions  . Penicillins     History   Social History  . Marital Status: Divorced    Spouse Name: N/A    Number of Children: 3  . Years of Education: N/A   Occupational History  .      Unemployed   Social History Main Topics  . Smoking status: Former Smoker    Quit date: 11/06/1996  . Smokeless tobacco: Not on file  . Alcohol Use: Yes     heavy ETOH (1 pint of liquor most days) until last visit.  states that she no longer drinks ETOH  . Drug Use: No     denies prior use  . Sexually Active: Not on file   Other Topics Concern  . Not on file   Social History Narrative   Lives in [place], unemployed.  Single.  3 children ages 75,24,28.    Family History  Problem Relation Age of Onset  . Stroke Mother     Physical Exam: Filed Vitals:   10/27/11 1551  BP: 160/101  Pulse: 74  Resp: 18  Height: 5\' 1"  (1.549 m)  Weight: 198 lb (89.812 kg)  SpO2: 98%    GEN- The patient is overweight appearing, alert and oriented x 3 today.   Head-  normocephalic, atraumatic Eyes-  Sclera clear, conjunctiva pink Ears- hearing intact Oropharynx- clear Neck- supple,  Lungs- Clear to ausculation bilaterally, normal work of breathing Heart- Regular rate and rhythm, no murmurs, rubs or gallops, PMI not laterally displaced GI- soft, NT, ND, + BS Extremities- no clubbing, cyanosis, or edema MS- no significant deformity or atrophy Skin- no rash or lesion Psych- euthymic mood, full affect Neuro- strength and sensation are intact  ekg today reveals sinus rhythm 75 bpm with several accelerated junctional beats observed, PR 160, QRS 94, Qtc 464, nonspecific ST/T changes  Assessment and Plan:

## 2011-10-27 NOTE — Assessment & Plan Note (Signed)
BP remains elevated Increase coreg to 12.5mg  BID Continue verapamil Weight loss and lifestyle modification including compliance with office visits and medicine is advised

## 2011-11-14 ENCOUNTER — Encounter: Payer: Self-pay | Admitting: Cardiology

## 2011-11-14 NOTE — Progress Notes (Signed)
   HPI: Pleasant female for fu of SVT. The patient was admitted in early August 2012. She had hypertensive urgency and atrial flutter based on discharge summary. I have reviewed ECG with Dr. Graciela Husbands and appears to be SVT. She was cared for by Dr. Nadara Eaton. TSH 5.665. Cardiac enzymes negative. Echo in August of 2012 showed EF 50-55, severe LVH, moderate LAE. Patient also with h/o noncompliance. Myoview in August of 2012 showed EF 40 and no ischemia. Seen by Dr Johney Frame and declined ablation. Since I last saw her,    Current Outpatient Prescriptions  Medication Sig Dispense Refill  . aspirin 81 MG EC tablet Take 81 mg by mouth daily.        . carvedilol (COREG) 12.5 MG tablet One by mouth twice daily  60 tablet  11  . Iron 66 MG TABS Take 1 tablet by mouth daily.        [name] lisinopril (PRINIVIL,ZESTRIL) 40 MG tablet Take 40 mg by mouth daily.        . verapamil (COVERA HS) 240 MG (CO) 24 hr tablet Take 240 mg by mouth at bedtime.           Past Medical History  Diagnosis Date  . Anxiety   . Hypertension   . SVT (supraventricular tachycardia) 10/22/10    wide complex tachycardia of same QRS as intinsic LBBB  . LBBB (left bundle branch block)   . Noncompliance   . Hyperlipidemia   . Asthma   . Atypical chest pain     recent low risk myoview    Past Surgical History  Procedure Date  . Tubal ligation     History   Social History  . Marital Status: Divorced    Spouse Name: N/A    Number of Children: 3  . Years of Education: N/A   Occupational History  .      Unemployed   Social History Main Topics  . Smoking status: Former Smoker    Quit date: 11/06/1996  . Smokeless tobacco: Not on file  . Alcohol Use: Yes     heavy ETOH (1 pint of liquor most days) until last visit.  states that she no longer drinks ETOH  . Drug Use: No     denies prior use  . Sexually Active: Not on file   Other Topics Concern  . Not on file   Social History Narrative   Lives in [place], unemployed.   Single.  3 children ages 12,24,28.    ROS: no fevers or chills, productive cough, hemoptysis, dysphasia, odynophagia, melena, hematochezia, dysuria, hematuria, rash, seizure activity, orthopnea, PND, pedal edema, claudication. Remaining systems are negative.  Physical Exam: Well-developed well-nourished in no acute distress.  Skin is warm and dry.  HEENT is normal.  Neck is supple. No thyromegaly.  Chest is clear to auscultation with normal expansion.  Cardiovascular exam is regular rate and rhythm.  Abdominal exam nontender or distended. No masses palpated. Extremities show no edema. neuro grossly intact  ECG     This encounter was created in error - please disregard.

## 2011-11-18 ENCOUNTER — Emergency Department (HOSPITAL_COMMUNITY): Payer: Self-pay

## 2011-11-18 ENCOUNTER — Encounter (HOSPITAL_COMMUNITY): Payer: Self-pay | Admitting: Emergency Medicine

## 2011-11-18 ENCOUNTER — Inpatient Hospital Stay (HOSPITAL_COMMUNITY)
Admission: EM | Admit: 2011-11-18 | Discharge: 2011-11-21 | DRG: 282 | Disposition: A | Discharge status: Home / Self Care | Payer: MEDICAID | Attending: Cardiology | Admitting: Cardiology

## 2011-11-18 DIAGNOSIS — I447 Left bundle-branch block, unspecified: Secondary | ICD-10-CM | POA: Diagnosis present

## 2011-11-18 DIAGNOSIS — I214 Non-ST elevation (NSTEMI) myocardial infarction: Principal | ICD-10-CM | POA: Diagnosis present

## 2011-11-18 DIAGNOSIS — R946 Abnormal results of thyroid function studies: Secondary | ICD-10-CM | POA: Diagnosis present

## 2011-11-18 DIAGNOSIS — F411 Generalized anxiety disorder: Secondary | ICD-10-CM | POA: Diagnosis present

## 2011-11-18 DIAGNOSIS — E785 Hyperlipidemia, unspecified: Secondary | ICD-10-CM | POA: Diagnosis present

## 2011-11-18 DIAGNOSIS — Z87891 Personal history of nicotine dependence: Secondary | ICD-10-CM

## 2011-11-18 DIAGNOSIS — Z91199 Patient's noncompliance with other medical treatment and regimen due to unspecified reason: Secondary | ICD-10-CM

## 2011-11-18 DIAGNOSIS — Z9119 Patient's noncompliance with other medical treatment and regimen: Secondary | ICD-10-CM

## 2011-11-18 DIAGNOSIS — J45909 Unspecified asthma, uncomplicated: Secondary | ICD-10-CM | POA: Diagnosis present

## 2011-11-18 DIAGNOSIS — I471 Supraventricular tachycardia: Secondary | ICD-10-CM

## 2011-11-18 DIAGNOSIS — I498 Other specified cardiac arrhythmias: Secondary | ICD-10-CM | POA: Diagnosis present

## 2011-11-18 DIAGNOSIS — R918 Other nonspecific abnormal finding of lung field: Secondary | ICD-10-CM | POA: Diagnosis present

## 2011-11-18 DIAGNOSIS — R599 Enlarged lymph nodes, unspecified: Secondary | ICD-10-CM | POA: Diagnosis present

## 2011-11-18 DIAGNOSIS — I10 Essential (primary) hypertension: Secondary | ICD-10-CM | POA: Diagnosis present

## 2011-11-18 DIAGNOSIS — I201 Angina pectoris with documented spasm: Secondary | ICD-10-CM | POA: Diagnosis not present

## 2011-11-18 HISTORY — DX: Angina pectoris with documented spasm: I20.1

## 2011-11-18 HISTORY — DX: Other specified abnormal findings of blood chemistry: R79.89

## 2011-11-18 LAB — BASIC METABOLIC PANEL
CO2: 28 mEq/L (ref 19–32)
Chloride: 102 mEq/L (ref 96–112)
Creatinine, Ser: 0.85 mg/dL (ref 0.50–1.10)
Sodium: 137 mEq/L (ref 135–145)

## 2011-11-18 LAB — CBC
Hemoglobin: 12 g/dL (ref 12.0–15.0)
MCV: 86.1 fL (ref 78.0–100.0)
Platelets: 237 10*3/uL (ref 150–400)
RBC: 4.32 MIL/uL (ref 3.87–5.11)
WBC: 8.2 10*3/uL (ref 4.0–10.5)

## 2011-11-18 LAB — POCT I-STAT, CHEM 8
Hemoglobin: 13.6 g/dL (ref 12.0–15.0)
Sodium: 140 mEq/L (ref 135–145)
TCO2: 25 mmol/L (ref 0–100)

## 2011-11-18 LAB — POCT I-STAT TROPONIN I: Troponin i, poc: 0.29 ng/mL (ref 0.00–0.08)

## 2011-11-18 MED ORDER — NITROGLYCERIN IN D5W 200-5 MCG/ML-% IV SOLN
5.0000 ug/min | INTRAVENOUS | Status: DC
  Administered 2011-11-19: 5 ug/min via INTRAVENOUS
  Filled 2011-11-18: qty 250

## 2011-11-18 MED ORDER — HEPARIN (PORCINE) IN NACL 100-0.45 UNIT/ML-% IJ SOLN
1000.0000 [IU]/h | INTRAMUSCULAR | Status: DC
  Administered 2011-11-19 (×3): 1000 [IU]/h via INTRAVENOUS
  Filled 2011-11-18 (×4): qty 250

## 2011-11-18 MED ORDER — HEPARIN BOLUS VIA INFUSION
4000.0000 [IU] | Freq: Once | INTRAVENOUS | Status: AC
  Administered 2011-11-19: 4000 [IU] via INTRAVENOUS

## 2011-11-18 MED ORDER — ASPIRIN 81 MG PO CHEW
324.0000 mg | CHEWABLE_TABLET | Freq: Once | ORAL | Status: AC
  Administered 2011-11-18: 324 mg via ORAL
  Filled 2011-11-18: qty 4

## 2011-11-18 NOTE — ED Notes (Signed)
Triage called to take vitals and recheck B/P .

## 2011-11-18 NOTE — ED Notes (Signed)
Pt c/o right sided CP with radiation to right arm intermittently x 1 week that became worse today; pt sts some SOB and pain with deep breath

## 2011-11-18 NOTE — ED Notes (Signed)
Called patient x 2 to recheck vitals. No answer in waiting room.

## 2011-11-18 NOTE — ED Provider Notes (Signed)
History     CSN: [id number]  Arrival date & time 11/18/11  1845   First MD Initiated Contact with Patient 11/18/11 2259      Chief Complaint  Patient presents with  . Chest Pain    (Consider location/radiation/quality/duration/timing/severity/associated sxs/prior treatment) HPI HX per PT. Chest tightness, on and off for the last week and more constant today. Onset 8am went to work, slept this afternoon, tonight developed R arm numbness and SOB. No h/o same. Followed by [facility] Dr Jens Som and [facility]. Stress test a year ago reported normal. Discussed cath but never had one. No h/o MI. States takes all HTN meds as prescribed.  Past Medical History  Diagnosis Date  . Anxiety   . Hypertension   . SVT (supraventricular tachycardia) 10/22/10    wide complex tachycardia of same QRS as intinsic LBBB  . LBBB (left bundle branch block)   . Noncompliance   . Hyperlipidemia   . Asthma   . Atypical chest pain     recent low risk myoview    Past Surgical History  Procedure Date  . Tubal ligation     Family History  Problem Relation Age of Onset  . Stroke Mother     History  Substance Use Topics  . Smoking status: Former Smoker    Quit date: 11/06/1996  . Smokeless tobacco: Not on file  . Alcohol Use: Yes     heavy ETOH (1 pint of liquor most days) until last visit.  states that she no longer drinks ETOH    OB History    Grav Para Term Preterm Abortions TAB SAB Ect Mult Living                  Review of Systems  Constitutional: Negative for fever and chills.  HENT: Negative for neck pain and neck stiffness.   Eyes: Negative for pain.  Respiratory: Positive for shortness of breath.   Cardiovascular: Positive for chest pain.  Gastrointestinal: Negative for abdominal pain.  Genitourinary: Negative for dysuria.  Musculoskeletal: Negative for back pain.  Skin: Negative for rash.  Neurological: Negative for headaches.  All other systems reviewed and are  negative.    Allergies  Penicillins  Home Medications   Current Outpatient Rx  Name Route Sig Dispense Refill  . ALBUTEROL SULFATE HFA 108 (90 BASE) MCG/ACT IN AERS Inhalation Inhale 2 puffs into the lungs every 6 (six) hours as needed. For wheezing    . ASPIRIN 81 MG PO TBEC Oral Take 81 mg by mouth daily.      [name] CARVEDILOL 12.5 MG PO TABS Oral Take 25 mg by mouth 2 (two) times daily with a meal.    . IRON 66 MG PO TABS Oral Take 1 tablet by mouth daily.      [name] LISINOPRIL 40 MG PO TABS Oral Take 40 mg by mouth daily.      [name] NAPHAZOLINE-GLYCERIN 0.012-0.2 % OP SOLN Both Eyes Place 1-2 drops into both eyes every 4 (four) hours as needed. For itchy eyes    . VERAPAMIL HCL ER (CO) 240 MG PO TB24 Oral Take 240 mg by mouth at bedtime.        BP 191/148  Pulse 55  Temp 98.7 F (37.1 C) (Oral)  Resp 18  SpO2 97%  LMP 11/13/2011  Physical Exam  Constitutional: She is oriented to person, place, and time. She appears well-developed and well-nourished.  HENT:  Head: Normocephalic and atraumatic.  Eyes: Conjunctivae and EOM  are normal. Pupils are equal, round, and reactive to light.  Neck: Trachea normal. Neck supple. No thyromegaly present.  Cardiovascular: Normal rate, regular rhythm, S1 normal, S2 normal and normal pulses.     No systolic murmur is present   No diastolic murmur is present  Pulses:      Radial pulses are 2+ on the right side, and 2+ on the left side.  Pulmonary/Chest: Effort normal and breath sounds normal. She has no wheezes. She has no rhonchi. She has no rales. She exhibits no tenderness.  Abdominal: Soft. Normal appearance and bowel sounds are normal. There is no tenderness. There is no CVA tenderness and negative Murphy's sign.  Musculoskeletal:       BLE:s Calves nontender, no cords or erythema, negative Homans sign  Neurological: She is alert and oriented to person, place, and time. She has normal strength. No cranial nerve deficit or sensory deficit. GCS eye  subscore is 4. GCS verbal subscore is 5. GCS motor subscore is 6.  Skin: Skin is warm and dry. No rash noted. She is not diaphoretic.  Psychiatric: Her speech is normal.       Cooperative and appropriate    ED Course  Procedures (including critical care time)  Results for orders placed during the hospital encounter of 11/18/11  CBC      Component Value Range   WBC 8.2  4.0 - 10.5 K/uL   RBC 4.32  3.87 - 5.11 MIL/uL   Hemoglobin 12.0  12.0 - 15.0 g/dL   HCT 16.1  09.6 - 04.5 %   MCV 86.1  78.0 - 100.0 fL   MCH 27.8  26.0 - 34.0 pg   MCHC 32.3  30.0 - 36.0 g/dL   RDW 40.9  81.1 - 91.4 %   Platelets 237  150 - 400 K/uL  POCT I-STAT TROPONIN I      Component Value Range   Troponin i, poc 0.29 (*) 0.00 - 0.08 ng/mL   Comment NOTIFIED PHYSICIAN     Comment 3           POCT I-STAT, CHEM 8      Component Value Range   Sodium 140  135 - 145 mEq/L   Potassium 3.5  3.5 - 5.1 mEq/L   Chloride 106  96 - 112 mEq/L   BUN 17  6 - 23 mg/dL   Creatinine, Ser 7.82  0.50 - 1.10 mg/dL   Glucose, Bld 956 (*) 70 - 99 mg/dL   Calcium, Ion 2.13 (*) 1.12 - 1.23 mmol/L   TCO2 25  0 - 100 mmol/L   Hemoglobin 13.6  12.0 - 15.0 g/dL   HCT 08.6  57.8 - 46.9 %   Dg Chest 2 View  11/18/2011  *RADIOLOGY REPORT*  Clinical Data: Recurring chest pain  CHEST - 2 VIEW  Comparison: 10/22/2010  Findings: Lungs are essentially clear.  No pleural effusion or pneumothorax.  The heart is top normal in size/mildly enlarged, unchanged.  Mild degenerative changes of the visualized thoracolumbar spine.  IMPRESSION: No evidence of acute cardiopulmonary disease.  Stable borderline cardiomegaly.   Original Report Authenticated By: Charline Bills, M.D.      Date: 11/18/2011  Rate: 63  Rhythm: normal sinus rhythm  QRS Axis: normal  Intervals: normal  ST/T Wave abnormalities: nonspecific ST/T changes  Conduction Disutrbances:nonspecific intraventricular conduction delay  Narrative Interpretation: new INf ST dep and T  wave inversions  Old EKG Reviewed: changes noted  PT pain free now,  very hypertensive. NTG drip initiated. ASA provided. Troponin noted and heparin initiated and CAR consulted.   11:44 PM d/w Kym Groom, on call for [place], requests lab troponin repeated stat and to call back with results, will eval in ED for admit.   CRITICAL CARE Performed by: Sunnie Nielsen   Total critical care time: 35  Critical care time was exclusive of separately billable procedures and treating other patients.  Critical care was necessary to treat or prevent imminent or life-threatening deterioration.  Critical care was time spent personally by me on the following activities: development of treatment plan with patient and/or surrogate as well as nursing, discussions with consultants, evaluation of patient's response to treatment, examination of patient, obtaining history from patient or surrogate, ordering and performing treatments and interventions, ordering and review of laboratory studies, ordering and review of radiographic studies, pulse oximetry and re-evaluation of patient's condition. IV NTG drip. IV heparin bolus and drip, serial evaluation, CAR consult/ admit.   MDM   CP evaluated with ECG, CXR, labs as above. Old records reviewed. CAR consult, IV medications, elevated troponin. Nursing notes and VS reviewed.         Sunnie Nielsen, MD 11/19/11 [phone]

## 2011-11-18 NOTE — ED Notes (Signed)
Dr.Opitz aware of critical i-stat troponin of 0.29

## 2011-11-19 ENCOUNTER — Inpatient Hospital Stay (HOSPITAL_COMMUNITY): Payer: Self-pay

## 2011-11-19 ENCOUNTER — Encounter (HOSPITAL_COMMUNITY): Payer: Self-pay | Admitting: Radiology

## 2011-11-19 DIAGNOSIS — I214 Non-ST elevation (NSTEMI) myocardial infarction: Secondary | ICD-10-CM

## 2011-11-19 LAB — COMPREHENSIVE METABOLIC PANEL
ALT: 11 U/L (ref 0–35)
AST: 20 U/L (ref 0–37)
Albumin: 3.7 g/dL (ref 3.5–5.2)
CO2: 25 mEq/L (ref 19–32)
Calcium: 9.7 mg/dL (ref 8.4–10.5)
Chloride: 100 mEq/L (ref 96–112)
GFR calc non Af Amer: >90 mL/min (ref >90)
Sodium: 134 mEq/L — ABNORMAL LOW (ref 135–145)
Total Bilirubin: 0.5 mg/dL (ref 0.3–1.2)

## 2011-11-19 LAB — CBC WITH DIFFERENTIAL/PLATELET
Basophils Absolute: 0.1 10*3/uL (ref 0.0–0.1)
Lymphocytes Relative: 18 % (ref 12–46)
Lymphs Abs: 1.6 10*3/uL (ref 0.7–4.0)
Neutrophils Relative %: 67 % (ref 43–77)
Platelets: 240 10*3/uL (ref 150–400)
RBC: 4.38 MIL/uL (ref 3.87–5.11)
RDW: 14.1 % (ref 11.5–15.5)
WBC: 8.4 10*3/uL (ref 4.0–10.5)

## 2011-11-19 LAB — PROTIME-INR
INR: 1.15 (ref 0.00–1.49)
Prothrombin Time: 14.9 seconds (ref 11.6–15.2)

## 2011-11-19 LAB — HEPARIN LEVEL (UNFRACTIONATED): Heparin Unfractionated: 0.4 IU/mL (ref 0.30–0.70)

## 2011-11-19 LAB — CARDIAC PANEL(CRET KIN+CKTOT+MB+TROPI)
CK, MB: 7 ng/mL (ref 0.3–4.0)
CK, MB: 7.9 ng/mL (ref 0.3–4.0)
Relative Index: 4.8 — ABNORMAL HIGH (ref 0.0–2.5)
Relative Index: 5.1 — ABNORMAL HIGH (ref 0.0–2.5)
Total CK: 143 U/L (ref 7–177)
Troponin I: 0.83 ng/mL (ref <0.30)

## 2011-11-19 LAB — CBC
MCV: 86.7 fL (ref 78.0–100.0)
Platelets: 208 10*3/uL (ref 150–400)
RDW: 14.3 % (ref 11.5–15.5)
WBC: 6.8 10*3/uL (ref 4.0–10.5)

## 2011-11-19 LAB — APTT: aPTT: 126 seconds — ABNORMAL HIGH (ref 24–37)

## 2011-11-19 LAB — TSH: TSH: 4.808 u[IU]/mL — ABNORMAL HIGH (ref 0.350–4.500)

## 2011-11-19 LAB — TROPONIN I: Troponin I: 0.4 ng/mL (ref <0.30)

## 2011-11-19 MED ORDER — SODIUM CHLORIDE 0.9 % IJ SOLN
3.0000 mL | Freq: Two times a day (BID) | INTRAMUSCULAR | Status: DC
  Administered 2011-11-19 – 2011-11-20 (×2): 3 mL via INTRAVENOUS

## 2011-11-19 MED ORDER — SODIUM CHLORIDE 0.9 % IJ SOLN: 3.0000 mL | INTRAMUSCULAR | Status: DC | PRN

## 2011-11-19 MED ORDER — DIAZEPAM 2 MG PO TABS
2.0000 mg | ORAL_TABLET | ORAL | Status: AC
  Administered 2011-11-20: 2 mg via ORAL
  Filled 2011-11-19: qty 1

## 2011-11-19 MED ORDER — SODIUM CHLORIDE 0.9 % IV SOLN
INTRAVENOUS | Status: DC
  Administered 2011-11-19: 03:00:00 via INTRAVENOUS

## 2011-11-19 MED ORDER — NITROGLYCERIN 0.4 MG SL SUBL: 0.4000 mg | SUBLINGUAL_TABLET | SUBLINGUAL | Status: DC | PRN

## 2011-11-19 MED ORDER — FERROUS SULFATE 325 (65 FE) MG PO TABS
325.0000 mg | ORAL_TABLET | Freq: Every day | ORAL | Status: DC
  Administered 2011-11-19 – 2011-11-21 (×3): 325 mg via ORAL
  Filled 2011-11-19 (×4): qty 1

## 2011-11-19 MED ORDER — ASPIRIN 81 MG PO CHEW
324.0000 mg | CHEWABLE_TABLET | ORAL | Status: AC
Start: 2011-11-20 — End: 2011-11-20
  Administered 2011-11-20: 324 mg via ORAL

## 2011-11-19 MED ORDER — SODIUM CHLORIDE 0.9 % IV SOLN
1.0000 mL/kg/h | INTRAVENOUS | Status: DC
  Administered 2011-11-20: 1 mL/kg/h via INTRAVENOUS

## 2011-11-19 MED ORDER — ALBUTEROL SULFATE HFA 108 (90 BASE) MCG/ACT IN AERS
2.0000 | INHALATION_SPRAY | Freq: Four times a day (QID) | RESPIRATORY_TRACT | Status: DC | PRN
  Filled 2011-11-19: qty 6.7

## 2011-11-19 MED ORDER — LISINOPRIL 40 MG PO TABS
40.0000 mg | ORAL_TABLET | Freq: Every day | ORAL | Status: DC
  Administered 2011-11-19 – 2011-11-21 (×3): 40 mg via ORAL
  Filled 2011-11-19 (×3): qty 1

## 2011-11-19 MED ORDER — NITROGLYCERIN IN D5W 200-5 MCG/ML-% IV SOLN
3.0000 ug/min | INTRAVENOUS | Status: DC
  Administered 2011-11-19: 5 ug/min via INTRAVENOUS

## 2011-11-19 MED ORDER — ASPIRIN 81 MG PO CHEW
81.0000 mg | CHEWABLE_TABLET | Freq: Every day | ORAL | Status: DC
  Administered 2011-11-19 – 2011-11-21 (×3): 81 mg via ORAL
  Filled 2011-11-19: qty 4
  Filled 2011-11-19 (×3): qty 1

## 2011-11-19 MED ORDER — VERAPAMIL HCL ER 240 MG PO TBCR
240.0000 mg | EXTENDED_RELEASE_TABLET | Freq: Every day | ORAL | Status: DC
  Administered 2011-11-19 – 2011-11-20 (×2): 240 mg via ORAL
  Filled 2011-11-19 (×3): qty 1

## 2011-11-19 MED ORDER — CARVEDILOL 25 MG PO TABS
25.0000 mg | ORAL_TABLET | Freq: Two times a day (BID) | ORAL | Status: DC
  Administered 2011-11-19 – 2011-11-21 (×5): 25 mg via ORAL
  Filled 2011-11-19 (×7): qty 1

## 2011-11-19 MED ORDER — SIMVASTATIN 20 MG PO TABS
20.0000 mg | ORAL_TABLET | Freq: Every day | ORAL | Status: DC
  Filled 2011-11-19: qty 1

## 2011-11-19 MED ORDER — IOHEXOL 350 MG/ML SOLN
100.0000 mL | Freq: Once | INTRAVENOUS | Status: AC | PRN
  Administered 2011-11-19: 100 mL via INTRAVENOUS

## 2011-11-19 MED ORDER — IRON 66 MG PO TABS
1.0000 | ORAL_TABLET | Freq: Every day | ORAL | Status: DC
Start: 2011-11-19 — End: 2011-11-19

## 2011-11-19 MED ORDER — ATORVASTATIN CALCIUM 10 MG PO TABS
10.0000 mg | ORAL_TABLET | Freq: Every day | ORAL | Status: DC
  Administered 2011-11-19 – 2011-11-20 (×2): 10 mg via ORAL
  Filled 2011-11-19 (×3): qty 1

## 2011-11-19 MED ORDER — SODIUM CHLORIDE 0.9 % IV SOLN: 250.0000 mL | INTRAVENOUS | Status: DC | PRN

## 2011-11-19 MED ORDER — ONDANSETRON HCL 4 MG/2ML IJ SOLN: 4.0000 mg | Freq: Four times a day (QID) | INTRAMUSCULAR | Status: DC | PRN

## 2011-11-19 MED ORDER — ACETAMINOPHEN 325 MG PO TABS
650.0000 mg | ORAL_TABLET | ORAL | Status: DC | PRN
  Administered 2011-11-19 – 2011-11-20 (×2): 650 mg via ORAL
  Filled 2011-11-19 (×2): qty 2

## 2011-11-19 NOTE — Clinical Social Work Note (Signed)
Clinical Social Worker received inappropriate referral for medication assistance. CM will be able to assist patient with medication needs. CSW will sign off, please re-consult if new issues arise.   Rozetta Nunnery MSW, [employer] [phone]

## 2011-11-19 NOTE — Progress Notes (Signed)
@   Subjective:  Denies CP or dyspnea   Objective:  Filed Vitals:   11/19/11 0245 11/19/11 0300 11/19/11 0359 11/19/11 0400  BP: 139/114 171/111  179/116  Pulse: 63 65  67  Temp:   98.4 F (36.9 C)   TempSrc:   Oral   Resp:      Height:      Weight:      SpO2: 100% 100%  100%    Intake/Output from previous day:  Intake/Output Summary (Last 24 hours) at 11/19/11 0740 Last data filed at 11/19/11 0400  Gross per 24 hour  Intake  82.41 ml  Output      0 ml  Net  82.41 ml    Physical Exam: Physical exam: Well-developed well-nourished in no acute distress.  Skin is warm and dry.  HEENT is normal.  Neck is supple.  Chest is clear to auscultation with normal expansion.  Cardiovascular exam is regular rate and rhythm.  Abdominal exam nontender or distended. No masses palpated. Extremities show no edema. neuro grossly intact    Lab Results: Basic Metabolic Panel:  Basename 11/19/11 0312 11/18/11 2321 11/18/11 2235  NA 134* 140 --  K 3.3* 3.5 --  CL 100 106 --  CO2 25 -- 28  GLUCOSE 106* 110* --  BUN 12 17 --  CREATININE 0.76 0.90 --  CALCIUM 9.7 -- 9.9  MG 1.8 -- --  PHOS -- -- --   CBC:  Basename 11/19/11 0630 11/19/11 0312  WBC 6.8 8.4  NEUTROABS -- 5.6  HGB 11.7* 12.4  HCT 36.4 37.3  MCV 86.7 85.2  PLT 208 240   Cardiac Enzymes:  Basename 11/19/11 0311 11/18/11 2257  CKTOTAL 145 --  CKMB 7.0* --  CKMBINDEX -- --  TROPONINI 0.83* 0.40*     Assessment/Plan:  1) NSTEMI - Continue present medications; cath in AM (risks and benefits discussed and patient agrees to proceed); presently pain free. Would like for BP to improve prior to cath. 2) Hypertension - continue present meds; add additional meds as needed. 3) SVT - Dr Johney Frame has recommended medical therapy; continue beta blocker and verapamil 4) Hyperlipidemia - continue statin 5) abnormal chest CT - FU in six months   Olga Millers 11/19/2011, 7:40 AM

## 2011-11-19 NOTE — Care Management Note (Signed)
    Page 1 of 1   11/19/2011     9:29:23 AM   CARE MANAGEMENT NOTE 11/19/2011  Patient:  NIKIESHA, MILFORD   Account Number:  [id number]  Date Initiated:  11/19/2011  Documentation initiated by:  Junius Creamer  Subjective/Objective Assessment:   adm w mi     Action/Plan:   lives w family, no ins listed   Anticipated DC Date:     Anticipated DC Plan:        DC Planning Services  CM consult      Choice offered to / List presented to:             Status of service:   Medicare Important Message given?   (If response is "NO", the following Medicare IM given date fields will be blank) Date Medicare IM given:   Date Additional Medicare IM given:    Discharge Disposition:    Per UR Regulation:  Reviewed for med. necessity/level of care/duration of stay  If discussed at Long Length of Stay Meetings, dates discussed:    Comments:  8/28 9:15a debbie Tal Neer rn,[phone] [phone] spoke w pt. [facility] so now has no pcp. gave her list of community resourses. gave pt 2 prescription cards for non generic meds.has orange card for generic meds.

## 2011-11-19 NOTE — Progress Notes (Signed)
ANTICOAGULATION CONSULT NOTE - Initial Consult  Pharmacy Consult for heparin Indication: chest pain/ACS  Allergies  Allergen Reactions  . Penicillins Hives    Patient Measurements: Height: 5\' 1"  (154.9 cm) Weight: 194 lb 3.6 oz (88.1 kg) IBW/kg (Calculated) : 47.8  Heparin Dosing Weight: 61 kg  Vital Signs: Temp: 98.1 F (36.7 C) (08/28 0235) Temp src: Oral (08/28 0235) BP: 181/115 mmHg (08/28 0235) Pulse Rate: 67  (08/28 0235)  Labs:  Basename 11/18/11 2321 11/18/11 2257 11/18/11 2235  HGB 13.6 -- 12.0  HCT 40.0 -- 37.2  PLT -- -- 237  APTT -- -- --  LABPROT -- -- --  INR -- -- --  HEPARINUNFRC -- -- --  CREATININE 0.90 -- 0.85  CKTOTAL -- -- --  CKMB -- -- --  TROPONINI -- 0.40* --    Estimated Creatinine Clearance: 78 ml/min (by C-G formula based on Cr of 0.9).   Medical History: Past Medical History  Diagnosis Date  . Anxiety   . Hypertension   . SVT (supraventricular tachycardia) 10/22/10    wide complex tachycardia of same QRS as intinsic LBBB  . LBBB (left bundle branch block)   . Noncompliance   . Hyperlipidemia   . Asthma   . Atypical chest pain     recent low risk myoview    Medications:  Prescriptions prior to admission  Medication Sig Dispense Refill  . albuterol (PROVENTIL HFA;VENTOLIN HFA) 108 (90 BASE) MCG/ACT inhaler Inhale 2 puffs into the lungs every 6 (six) hours as needed. For wheezing      . aspirin 81 MG EC tablet Take 81 mg by mouth daily.        . carvedilol (COREG) 12.5 MG tablet Take 12.5 mg by mouth 2 (two) times daily with a meal.      . Iron 66 MG TABS Take 1 tablet by mouth daily.        [name] lisinopril (PRINIVIL,ZESTRIL) 40 MG tablet Take 40 mg by mouth daily.        . naphazoline-glycerin (CLEAR EYES) 0.012-0.2 % SOLN Place 1-2 drops into both eyes every 4 (four) hours as needed. For itchy eyes      . verapamil (COVERA HS) 240 MG (CO) 24 hr tablet Take 240 mg by mouth at bedtime.          Assessment: 46 yo lady presents  to ED with CP.  Heparin started in ED with 4000 unit bolus and drip at 1000 units/hr. Goal of Therapy:  Heparin level 0.3-0.7 units/ml Monitor platelets by anticoagulation protocol: Yes   Plan:  Continue heparin drip at 1000 units/hr. Check heparin level and CBC 6 hours after start the daily while on heparin. Monitoring S&S bleeding.  Marlea Gambill Poteet 11/19/2011,2:46 AM

## 2011-11-19 NOTE — H&P (Signed)
Admit date: 11/18/2011 Referring Physician  Dr. Dierdre Highman Primary Cardiologist Dr. Jens Som Chief complaint/reason for admission: chest pain  ZOX:WRUE is a 46yo female with a histofy of HTN, SVT declined ablation), anxiety, LBBB, atypical CP with history of low risk myoview a year ago and medical noncompliance who presented to the ER with complaints of chest tightness on and off for the last week which became constant today.  Around 8am this am she took the kids to the bus stop and went home and sat in a chair and the pain reoccured.  She says that she slept most of this afternoon but the pain did not go away.  Tonight she had right arm numbness with SOB and presented to the ER where a troponin was elevated at 0.29.  We are asked to evaluate.  Currently she has no chest pain at rest but if she gets up to move around the pain reoccurs.  On arrival in ER her BP was 203/141mmHg and currently is  149/8mmHg on IV NTG gtt.      PMH:    Past Medical History  Diagnosis Date  . Anxiety   . Hypertension   . SVT (supraventricular tachycardia) 10/22/10    wide complex tachycardia of same QRS as intinsic LBBB  . LBBB (left bundle branch block)   . Noncompliance   . Hyperlipidemia   . Asthma   . Atypical chest pain     recent low risk myoview    PSH:    Past Surgical History  Procedure Date  . Tubal ligation     ALLERGIES:   Penicillins  Prior to Admit Meds:   (Not in a hospital admission) Family HX:    Family History  Problem Relation Age of Onset  . Stroke Mother    Social HX:    History   Social History  . Marital Status: Divorced    Spouse Name: N/A    Number of Children: 3  . Years of Education: N/A   Occupational History  .      Unemployed   Social History Main Topics  . Smoking status: Former Smoker    Quit date: 11/06/1996  . Smokeless tobacco: Not on file  . Alcohol Use: Yes     heavy ETOH (1 pint of liquor most days) until last visit.  states that she no longer drinks  ETOH  . Drug Use: No     denies prior use  . Sexually Active: Not on file   Other Topics Concern  . Not on file   Social History Narrative   Lives in [place], unemployed.  Single.  3 children ages 72,24,28.     ROS:  All 11 ROS were addressed and are negative except what is stated in the HPI  PHYSICAL EXAM Filed Vitals:   11/18/11 2239  BP: 191/148  Pulse: 55  Temp:   Resp:    General: Well developed, well nourished, in no acute distress Head: Eyes PERRLA, No xanthomas.   Normal cephalic and atramatic  Lungs:   Clear bilaterally to auscultation and percussion. Heart: HRRR S1 S2 Pulses are 2+ & equal.            No carotid bruit. No JVD.  No abdominal bruits. No femoral bruits. Abdomen: Bowel sounds are positive, abdomen soft and non-tender without masses Neuro: Alert and oriented X 3. Psych:  Good affect, responds appropriately EXT:  2+ DP pulse in right leg, trace DP pulse in left   Labs:  Lab Results  Component Value Date   WBC 8.2 11/18/2011   HGB 13.6 11/18/2011   HCT 40.0 11/18/2011   MCV 86.1 11/18/2011   PLT 237 11/18/2011    Lab 11/18/11 2321 11/18/11 2235  NA 140 --  K 3.5 --  CL 106 --  CO2 -- 28  BUN 17 --  CREATININE 0.90 --  CALCIUM -- 9.9  PROT -- --  BILITOT -- --  ALKPHOS -- --  ALT -- --  AST -- --  GLUCOSE 110* --          Radiology:  *RADIOLOGY REPORT*  Clinical Data: Recurring chest pain  CHEST - 2 VIEW  Comparison: 10/22/2010  Findings: Lungs are essentially clear. No pleural effusion or  pneumothorax.  The heart is top normal in size/mildly enlarged, unchanged.  Mild degenerative changes of the visualized thoracolumbar spine.  IMPRESSION:  No evidence of acute cardiopulmonary disease.  Stable borderline cardiomegaly.  Original Report Authenticated By: Charline Bills, M.D.   EKG: NSR with inferolateral T wave abnormality c/w ischemia more pronounced than EKG 04/14/11  ASSESSMENT: 1.  Acute hypertensive urgency 2.   Chest pain with elevated troponin most likely due to type II NSTEMI from demand ischemia 3.  LBBB 4.  H/O SVT 5.  Medical noncompliance 6.  Atypical chest pain in past with negative myoview a year ago 7.  Asthma 8.  Anxiety  PLAN:  1.  Admit to tele bed 2.  Cycle cardiac enzymes 3.  IV NTG gtt for BP control and CP 4.  Continue ASA/beta blocker but increase coreg to 25mg  BID 5.  IV Heparin gtt - if chest CT negative for aortic dissection 6.  Add statin 7.  NPO after midnight - ? Cath in am based on enzyme trend 8.  Chest CT rule out aortic dissection given decreased pulse in left leg with chest pain and hypertensive urgency Quintella Reichert, MD  11/19/2011  12:02 AM

## 2011-11-19 NOTE — Progress Notes (Signed)
ANTICOAGULATION CONSULT NOTE - Follow Up Consult  Pharmacy Consult for heparin Indication: chest pain/ACS  Allergies  Allergen Reactions  . Penicillins Hives    Patient Measurements: Height: 5\' 1"  (154.9 cm) Weight: 194 lb 3.6 oz (88.1 kg) IBW/kg (Calculated) : 47.8   Vital Signs: Temp: 98.4 F (36.9 C) (08/28 0359) Temp src: Oral (08/28 0359) BP: 179/116 mmHg (08/28 0400) Pulse Rate: 67  (08/28 0400)  Labs:  Basename 11/19/11 0630 11/19/11 0312 11/19/11 0311 11/18/11 2321 11/18/11 2257 11/18/11 2235  HGB 11.7* 12.4 -- -- -- --  HCT 36.4 37.3 -- 40.0 -- --  PLT 208 240 -- -- -- 237  APTT -- 126* -- -- -- --  LABPROT -- 14.9 -- -- -- --  INR -- 1.15 -- -- -- --  HEPARINUNFRC 0.38 -- -- -- -- --  CREATININE -- 0.76 -- 0.90 -- 0.85  CKTOTAL -- -- 145 -- -- --  CKMB -- -- 7.0* -- -- --  TROPONINI -- -- 0.83* -- 0.40* --    Estimated Creatinine Clearance: 87.7 ml/min (by C-G formula based on Cr of 0.76).   Medications:  Scheduled:    . aspirin  324 mg Oral Once  . aspirin  81 mg Oral Daily  . carvedilol  25 mg Oral BID WC  . ferrous sulfate  325 mg Oral Q breakfast  . heparin  4,000 Units Intravenous Once  . lisinopril  40 mg Oral Daily  . simvastatin  20 mg Oral q1800  . verapamil  240 mg Oral QHS  . DISCONTD: Iron  1 tablet Oral Daily    Assessment: 46 yo female with NSTEMI on heparin and at goal (HL=0.38) and Hg/Hct/plt are stable. Patient noted for cath on 11/20/11  Goal of Therapy:  Heparin level 0.3-0.7 units/ml Monitor platelets by anticoagulation protocol: Yes   Plan:  -No heparin changes needed -Will repeat a level later today to confirm at goal  Harland German, Pharm D 11/19/2011 7:55 AM

## 2011-11-19 NOTE — Progress Notes (Deleted)
ANTICOAGULATION CONSULT NOTE - Initial Consult  Pharmacy Consult for heparin  Indication: chest pain/ACS  Allergies  Allergen Reactions  . Penicillins Hives    Patient Measurements: Height: 5\' 1"  (154.9 cm) Weight: 194 lb 3.6 oz (88.1 kg) IBW/kg (Calculated) : 47.8  Heparin Dosing Weight:    Vital Signs: Temp: 98.1 F (36.7 C) (08/28 0235) Temp src: Oral (08/28 0235) BP: 139/114 mmHg (08/28 0245) Pulse Rate: 63  (08/28 0245)  Labs:  Basename 11/18/11 2321 11/18/11 2257 11/18/11 2235  HGB 13.6 -- 12.0  HCT 40.0 -- 37.2  PLT -- -- 237  APTT -- -- --  LABPROT -- -- --  INR -- -- --  HEPARINUNFRC -- -- --  CREATININE 0.90 -- 0.85  CKTOTAL -- -- --  CKMB -- -- --  TROPONINI -- 0.40* --    Estimated Creatinine Clearance: 78 ml/min (by C-G formula based on Cr of 0.9).   Medical History: Past Medical History  Diagnosis Date  . Anxiety   . Hypertension   . SVT (supraventricular tachycardia) 10/22/10    wide complex tachycardia of same QRS as intinsic LBBB  . LBBB (left bundle branch block)   . Noncompliance   . Hyperlipidemia   . Asthma   . Atypical chest pain     recent low risk myoview    Medications:  Prescriptions prior to admission  Medication Sig Dispense Refill  . albuterol (PROVENTIL HFA;VENTOLIN HFA) 108 (90 BASE) MCG/ACT inhaler Inhale 2 puffs into the lungs every 6 (six) hours as needed. For wheezing      . aspirin 81 MG EC tablet Take 81 mg by mouth daily.        . carvedilol (COREG) 12.5 MG tablet Take 12.5 mg by mouth 2 (two) times daily with a meal.      . Iron 66 MG TABS Take 1 tablet by mouth daily.        [name] lisinopril (PRINIVIL,ZESTRIL) 40 MG tablet Take 40 mg by mouth daily.        . naphazoline-glycerin (CLEAR EYES) 0.012-0.2 % SOLN Place 1-2 drops into both eyes every 4 (four) hours as needed. For itchy eyes      . verapamil (COVERA HS) 240 MG (CO) 24 hr tablet Take 240 mg by mouth at bedtime.          Assessment: 46 yo female with hx of  htn svt(declined ablation) lbbb and atypical cp presenting with constant chest pain numbness in right arm and sob. Heparin for acs  Goal of Therapy:  Heparin level 0.3-0.7 units/ml Monitor platelets by anticoagulation protocol: Yes   Plan:  Heparin 4000 unit bolus then 1250 units/hr. 0900 HL then daily HL and cbc starting 8/29  Janice Coffin 11/19/2011,2:49 AM

## 2011-11-20 ENCOUNTER — Encounter (HOSPITAL_COMMUNITY)
Admission: EM | Disposition: A | Discharge status: Home / Self Care | Payer: Self-pay | Source: Home / Self Care | Attending: Cardiology

## 2011-11-20 DIAGNOSIS — I214 Non-ST elevation (NSTEMI) myocardial infarction: Secondary | ICD-10-CM

## 2011-11-20 HISTORY — PX: LEFT HEART CATHETERIZATION WITH CORONARY ANGIOGRAM: SHX5451

## 2011-11-20 LAB — POCT ACTIVATED CLOTTING TIME: Activated Clotting Time: 184 seconds

## 2011-11-20 LAB — CBC
MCH: 27.9 pg (ref 26.0–34.0)
MCV: 85.4 fL (ref 78.0–100.0)
Platelets: 203 10*3/uL (ref 150–400)
RDW: 14.2 % (ref 11.5–15.5)
WBC: 7.5 10*3/uL (ref 4.0–10.5)

## 2011-11-20 LAB — BASIC METABOLIC PANEL
Calcium: 9.2 mg/dL (ref 8.4–10.5)
Chloride: 101 mEq/L (ref 96–112)
Creatinine, Ser: 0.96 mg/dL (ref 0.50–1.10)
GFR calc Af Amer: 80 mL/min — ABNORMAL LOW (ref >90)
Sodium: 136 mEq/L (ref 135–145)

## 2011-11-20 SURGERY — LEFT HEART CATHETERIZATION WITH CORONARY ANGIOGRAM
Anesthesia: LOCAL

## 2011-11-20 MED ORDER — POTASSIUM CHLORIDE CRYS ER 20 MEQ PO TBCR
40.0000 meq | EXTENDED_RELEASE_TABLET | ORAL | Status: AC
  Administered 2011-11-20 (×3): 40 meq via ORAL
  Filled 2011-11-20 (×3): qty 2

## 2011-11-20 MED ORDER — ACETAMINOPHEN 325 MG PO TABS: 650.0000 mg | ORAL_TABLET | ORAL | Status: DC | PRN

## 2011-11-20 MED ORDER — ISOSORBIDE MONONITRATE ER 30 MG PO TB24
30.0000 mg | ORAL_TABLET | Freq: Every day | ORAL | Status: DC
  Administered 2011-11-20: 16:00:00 30 mg via ORAL
  Filled 2011-11-20 (×2): qty 1

## 2011-11-20 MED ORDER — VERAPAMIL HCL 2.5 MG/ML IV SOLN
INTRAVENOUS | Status: AC
  Filled 2011-11-20: qty 2

## 2011-11-20 MED ORDER — MIDAZOLAM HCL 2 MG/2ML IJ SOLN
INTRAMUSCULAR | Status: AC
  Filled 2011-11-20: qty 2

## 2011-11-20 MED ORDER — FENTANYL CITRATE 0.05 MG/ML IJ SOLN
INTRAMUSCULAR | Status: AC
  Filled 2011-11-20: qty 2

## 2011-11-20 MED ORDER — HYDRALAZINE HCL 20 MG/ML IJ SOLN
10.0000 mg | Freq: Four times a day (QID) | INTRAMUSCULAR | Status: DC | PRN
  Administered 2011-11-20: 10 mg via INTRAVENOUS
  Filled 2011-11-20: qty 0.5

## 2011-11-20 MED ORDER — NITROGLYCERIN 0.2 MG/ML ON CALL CATH LAB
INTRAVENOUS | Status: AC
  Filled 2011-11-20: qty 1

## 2011-11-20 MED ORDER — ONDANSETRON HCL 4 MG/2ML IJ SOLN: 4.0000 mg | Freq: Four times a day (QID) | INTRAMUSCULAR | Status: DC | PRN

## 2011-11-20 MED ORDER — LABETALOL HCL 5 MG/ML IV SOLN
20.0000 mg | INTRAVENOUS | Status: DC | PRN
  Administered 2011-11-20: 20 mg via INTRAVENOUS
  Filled 2011-11-20: qty 4

## 2011-11-20 MED ORDER — HEPARIN (PORCINE) IN NACL 2-0.9 UNIT/ML-% IJ SOLN
INTRAMUSCULAR | Status: AC
  Filled 2011-11-20: qty 2000

## 2011-11-20 MED ORDER — OXYCODONE-ACETAMINOPHEN 5-325 MG PO TABS
1.0000 | ORAL_TABLET | ORAL | Status: DC | PRN
  Administered 2011-11-20: 1 via ORAL
  Filled 2011-11-20: qty 1

## 2011-11-20 MED ORDER — LIDOCAINE HCL (PF) 1 % IJ SOLN
INTRAMUSCULAR | Status: AC
  Filled 2011-11-20: qty 30

## 2011-11-20 MED ORDER — HEPARIN SODIUM (PORCINE) 1000 UNIT/ML IJ SOLN
INTRAMUSCULAR | Status: AC
  Filled 2011-11-20: qty 1

## 2011-11-20 NOTE — Progress Notes (Signed)
TR BAND REMOVAL  LOCATION:    right radial  DEFLATED PER PROTOCOL:    yes  TIME BAND OFF / DRESSING APPLIED:    1600   SITE UPON ARRIVAL:    Level 0  SITE AFTER BAND REMOVAL:    Level 0  REVERSE Kumari'S TEST:     positive  CIRCULATION SENSATION AND MOVEMENT:    Within Normal Limits   yes  COMMENTS:   Tolerated procedure well 

## 2011-11-20 NOTE — CV Procedure (Signed)
Cardiac Catheterization Procedure Note  Name: NIERRA SCHOLLE MRN: [id number] DOB: 1965/12/23  Procedure: Left Heart Cath, Selective Coronary Angiography, LV angiography  Indication: 46 year old woman who presented with hypertensive urgency and non-ST elevation infarction. She is referred for cardiac catheterization    Procedural Details: The right wrist was prepped, draped, and anesthetized with 1% lidocaine. Using the modified Seldinger technique, a 5 French sheath was introduced into the right radial artery. 3 mg of verapamil was administered through the sheath, weight-based unfractionated heparin was administered intravenously. There was resistance to passing the wire at the level of the elbow. A radial artery angiogram was done and this demonstrated a radial loop. I first tried to navigate the loop with a Glidewire but this would not work. I then used a cougar coronary wire and this successfully went across the loop into the brachial artery. However, the JR 4 catheter would not pass over this wire. I then tried a 4 [place] JR 4 catheter and this was also unsuccessful. With the catheter in the loop, I tried to advance a versacore wire and a glide wire, both were unsuccessful. At that point attention was turned to the right groin area. The groin was prepped, draped, and anesthetized with 1% lidocaine. Using the modified Seldinger technique, a 5 French sheath was introduced into the right femoral artery. Standard Judkins catheters were used for selective coronary angiography and left ventriculography. Catheter exchanges were performed over an exchange length guidewire. Intracoronary nitroglycerin was administered into the left coronary artery. There were no immediate procedural complications. A TR band was used for radial hemostasis at the completion of the procedure.  The patient was transferred to the post catheterization recovery area for further monitoring.  Procedural Findings: Hemodynamics: AO  135/80 LV 124/24  Coronary angiography: Coronary dominance: right  Left mainstem: Widely patent no obstructive disease  Left anterior descending (LAD): The LAD is widely patent throughout its course. There is no obstructive disease. There is a large first diagonal branch with no significant stenosis. The LAD wraps around the left jugular apex   Left circumflex (LCx): The AV groove circumflex is patent throughout. There were 3 large obtuse marginal branches with no significant obstruction.  Right coronary artery (RCA): The RCA is dominant. There is no significant stenosis. There is a large acute marginal branch and 2 posterolateral branches without significant stenosis.  Left ventriculography: Left ventricular systolic function is normal, LVEF is estimated at 65-70%, there is no significant mitral regurgitation   Final Conclusions:   1. Widely patent coronary arteries with no significant angiographic disease 2. Normal left ventricular systolic function  Recommendations: Aggressive medical therapy of malignant hypertension. The patient was noted to have diffuse coronary vasospasm on the first injection left coronary artery. This responded well to nitroglycerin. I will start her on vasodilator therapy as part of her hypertension treatment as coronary spasm may have contributed to her symptoms.  Tonny Bollman 11/20/2011, 12:47 PM

## 2011-11-20 NOTE — Plan of Care (Signed)
Problem: Phase III Progression Outcomes Goal: Vascular site scale level 0 - I Vascular Site Scale Level 0: No bruising/bleeding/hematoma Level I (Mild): Bruising/Ecchymosis, minimal bleeding/ooozing, palpable hematoma < 3 cm Level II (Moderate): Bleeding not affecting hemodynamic parameters, pseudoaneurysm, palpable hematoma > 3 cm  Outcome: Completed/Met Date Met:  11/20/11 R radial Level"0"; R femoral Level"0"

## 2011-11-20 NOTE — Progress Notes (Signed)
@   Subjective:  Denies CP or dyspnea; complains of headache  Objective:  Filed Vitals:   11/20/11 0300 11/20/11 0321 11/20/11 0400 11/20/11 0500  BP: 149/100  114/58 121/88  Pulse:      Temp:  98.5 F (36.9 C)    TempSrc:  Oral    Resp:  15    Height:      Weight:  191 lb 5.8 oz (86.8 kg)    SpO2: 99% 100% 98% 99%    Intake/Output from previous day:  Intake/Output Summary (Last 24 hours) at 11/20/11 0720 Last data filed at 11/20/11 0500  Gross per 24 hour  Intake 1279.6 ml  Output    650 ml  Net  629.6 ml    Physical Exam: Physical exam: Well-developed well-nourished in no acute distress.  Skin is warm and dry.  HEENT is normal.  Neck is supple.  Chest is clear to auscultation with normal expansion.  Cardiovascular exam is regular rate and rhythm.  Abdominal exam nontender or distended. No masses palpated. Extremities show no edema. neuro grossly intact    Lab Results: Basic Metabolic Panel:  Basename 11/19/11 0312 11/18/11 2321 11/18/11 2235  NA 134* 140 --  K 3.3* 3.5 --  CL 100 106 --  CO2 25 -- 28  GLUCOSE 106* 110* --  BUN 12 17 --  CREATININE 0.76 0.90 --  CALCIUM 9.7 -- 9.9  MG 1.8 -- --  PHOS -- -- --   CBC:  Basename 11/20/11 0442 11/19/11 0630 11/19/11 0312  WBC 7.5 6.8 --  NEUTROABS -- -- 5.6  HGB 11.1* 11.7* --  HCT 34.0* 36.4 --  MCV 85.4 86.7 --  PLT 203 208 --   Cardiac Enzymes:  Basename 11/19/11 1519 11/19/11 0946 11/19/11 0311  CKTOTAL 119 143 145  CKMB 6.1* 7.9* 7.0*  CKMBINDEX -- -- --  TROPONINI 0.77* 1.01* 0.83*     Assessment/Plan:  1) NSTEMI - Continue present medications; cath today (risks and benefits discussed and patient agrees to proceed); presently pain free. DC NTG as patient with HA. 2) Hypertension - continue present meds; add additional meds as needed. Blood pressure much improved. 3) SVT - Dr Allred has recommended medical therapy; continue beta blocker and verapamil 4) Hyperlipidemia - continue  statin 5) abnormal chest CT - FU in six months 6) Mildly elevated TSH - check free T3 and free T4   Brian Crenshaw 11/20/2011, 7:20 AM    

## 2011-11-20 NOTE — Interval H&P Note (Signed)
History and Physical Interval Note:  11/20/2011 11:59 AM  [name]  has presented today for surgery, with the diagnosis of cp  The various methods of treatment have been discussed with the patient and family. After consideration of risks, benefits and other options for treatment, the patient has consented to  Procedure(s) (LRB): LEFT HEART CATHETERIZATION WITH CORONARY ANGIOGRAM (N/A) as a surgical intervention .  The patient's history has been reviewed, patient examined, no change in status, stable for surgery.  I have reviewed the patient's chart and labs.  Questions were answered to the patient's satisfaction.     Tonny Bollman

## 2011-11-20 NOTE — H&P (View-Only) (Signed)
@   Subjective:  Denies CP or dyspnea; complains of headache  Objective:  Filed Vitals:   11/20/11 0300 11/20/11 0321 11/20/11 0400 11/20/11 0500  BP: 149/100  114/58 121/88  Pulse:      Temp:  98.5 F (36.9 C)    TempSrc:  Oral    Resp:  15    Height:      Weight:  191 lb 5.8 oz (86.8 kg)    SpO2: 99% 100% 98% 99%    Intake/Output from previous day:  Intake/Output Summary (Last 24 hours) at 11/20/11 0720 Last data filed at 11/20/11 0500  Gross per 24 hour  Intake 1279.6 ml  Output    650 ml  Net  629.6 ml    Physical Exam: Physical exam: Well-developed well-nourished in no acute distress.  Skin is warm and dry.  HEENT is normal.  Neck is supple.  Chest is clear to auscultation with normal expansion.  Cardiovascular exam is regular rate and rhythm.  Abdominal exam nontender or distended. No masses palpated. Extremities show no edema. neuro grossly intact    Lab Results: Basic Metabolic Panel:  Basename 11/19/11 0312 11/18/11 2321 11/18/11 2235  NA 134* 140 --  K 3.3* 3.5 --  CL 100 106 --  CO2 25 -- 28  GLUCOSE 106* 110* --  BUN 12 17 --  CREATININE 0.76 0.90 --  CALCIUM 9.7 -- 9.9  MG 1.8 -- --  PHOS -- -- --   CBC:  Basename 11/20/11 0442 11/19/11 0630 11/19/11 0312  WBC 7.5 6.8 --  NEUTROABS -- -- 5.6  HGB 11.1* 11.7* --  HCT 34.0* 36.4 --  MCV 85.4 86.7 --  PLT 203 208 --   Cardiac Enzymes:  Basename 11/19/11 1519 11/19/11 0946 11/19/11 0311  CKTOTAL 119 143 145  CKMB 6.1* 7.9* 7.0*  CKMBINDEX -- -- --  TROPONINI 0.77* 1.01* 0.83*     Assessment/Plan:  1) NSTEMI - Continue present medications; cath today (risks and benefits discussed and patient agrees to proceed); presently pain free. DC NTG as patient with HA. 2) Hypertension - continue present meds; add additional meds as needed. Blood pressure much improved. 3) SVT - Dr Johney Frame has recommended medical therapy; continue beta blocker and verapamil 4) Hyperlipidemia - continue  statin 5) abnormal chest CT - FU in six months 6) Mildly elevated TSH - check free T3 and free T4   Olga Millers 11/20/2011, 7:20 AM

## 2011-11-21 ENCOUNTER — Encounter (HOSPITAL_COMMUNITY): Payer: Self-pay | Admitting: Physician Assistant

## 2011-11-21 DIAGNOSIS — I498 Other specified cardiac arrhythmias: Secondary | ICD-10-CM

## 2011-11-21 DIAGNOSIS — I10 Essential (primary) hypertension: Secondary | ICD-10-CM

## 2011-11-21 LAB — BASIC METABOLIC PANEL
CO2: 23 mEq/L (ref 19–32)
Chloride: 104 mEq/L (ref 96–112)
GFR calc Af Amer: 79 mL/min — ABNORMAL LOW (ref >90)
Potassium: 3.9 mEq/L (ref 3.5–5.1)
Sodium: 137 mEq/L (ref 135–145)

## 2011-11-21 LAB — T3: T3, Total: 68.8 ng/dl — ABNORMAL LOW (ref 80.0–204.0)

## 2011-11-21 LAB — CBC
Platelets: 195 10*3/uL (ref 150–400)
RBC: 3.9 MIL/uL (ref 3.87–5.11)
RDW: 14.5 % (ref 11.5–15.5)
WBC: 5.9 10*3/uL (ref 4.0–10.5)

## 2011-11-21 LAB — T4, FREE: Free T4: 1.07 ng/dL (ref 0.80–1.80)

## 2011-11-21 LAB — POCT ACTIVATED CLOTTING TIME: Activated Clotting Time: 175 seconds

## 2011-11-21 MED ORDER — ISOSORBIDE MONONITRATE ER 60 MG PO TB24: 60.0000 mg | ORAL_TABLET | Freq: Every day | ORAL | Status: DC

## 2011-11-21 MED ORDER — ISOSORBIDE MONONITRATE ER 60 MG PO TB24
60.0000 mg | ORAL_TABLET | Freq: Every day | ORAL | Status: DC
  Filled 2011-11-21 (×2): qty 1

## 2011-11-21 MED ORDER — NITROGLYCERIN 0.4 MG SL SUBL: 0.4000 mg | SUBLINGUAL_TABLET | SUBLINGUAL | Status: DC | PRN

## 2011-11-21 MED ORDER — ATORVASTATIN CALCIUM 10 MG PO TABS: 10.0000 mg | ORAL_TABLET | Freq: Every day | ORAL | Status: DC

## 2011-11-21 MED ORDER — CARVEDILOL 25 MG PO TABS: 25.0000 mg | ORAL_TABLET | Freq: Two times a day (BID) | ORAL | Status: DC

## 2011-11-21 NOTE — Progress Notes (Signed)
Subjective: Patient denies CP.   Objective: Filed Vitals:   11/20/11 1930 11/20/11 1950 11/21/11 0000 11/21/11 0435  BP: 148/86 148/86 144/84 140/88  Pulse: 72  69 71  Temp:  99.1 F (37.3 C) 98.3 F (36.8 C) 98.2 F (36.8 C)  TempSrc:  Oral Oral Oral  Resp: 14 20 19 16   Height:      Weight:    198 lb 10.2 oz (90.1 kg)  SpO2: 100% 100% 100% 100%   Weight change: 7 lb 4.4 oz (3.3 kg)  Intake/Output Summary (Last 24 hours) at 11/21/11 0830 Last data filed at 11/20/11 1707  Gross per 24 hour  Intake  534.3 ml  Output    250 ml  Net  284.3 ml    General: Alert, awake, oriented x3, in no acute distress Neck:  JVP is normal Heart: Regular rate and rhythm, without murmurs, rubs, gallops.  Lungs: Clear to auscultation.  No rales or wheezes. Exemities:  No edema.  Groin without hematoma.  R wrist without hematoma. Neuro: Grossly intact, nonfocal.   Lab Results: Results for orders placed during the hospital encounter of 11/18/11 (from the past 24 hour(s))  POCT ACTIVATED CLOTTING TIME     Status: Normal   Collection Time   11/20/11 12:45 PM      Component Value Range   Activated Clotting Time 184    POCT ACTIVATED CLOTTING TIME     Status: Normal   Collection Time   11/20/11  1:15 PM      Component Value Range   Activated Clotting Time 175    CBC     Status: Abnormal   Collection Time   11/21/11  5:00 AM      Component Value Range   WBC 5.9  4.0 - 10.5 K/uL   RBC 3.90  3.87 - 5.11 MIL/uL   Hemoglobin 11.0 (*) 12.0 - 15.0 g/dL   HCT 40.9 (*) 81.1 - 91.4 %   MCV 86.4  78.0 - 100.0 fL   MCH 28.2  26.0 - 34.0 pg   MCHC 32.6  30.0 - 36.0 g/dL   RDW 78.2  95.6 - 21.3 %   Platelets 195  150 - 400 K/uL  BASIC METABOLIC PANEL     Status: Abnormal   Collection Time   11/21/11  5:00 AM      Component Value Range   Sodium 137  135 - 145 mEq/L   Potassium 3.9  3.5 - 5.1 mEq/L   Chloride 104  96 - 112 mEq/L   CO2 23  19 - 32 mEq/L   Glucose, Bld 82  70 - 99 mg/dL   BUN 17  6  - 23 mg/dL   Creatinine, Ser 0.86  0.50 - 1.10 mg/dL   Calcium 9.0  8.4 - 57.8 mg/dL   GFR calc non Af Amer 68 (*) >90 mL/min   GFR calc Af Amer 79 (*) >90 mL/min    Studies/Results: @[id number] @  Medications: Reviewed  Impression:  1.  CP  Cath yesterday showed no significant CAD.  L coronary artery did spasm at time of cath.  May have bee part of problem leading to presentation, instead of HTN crisis alone.  Patient currently on Verapamil and NTG  WOuld continue.  COntrol BP.  2.  HTN  BP is better though not optimal.  Would increase Imdur and follow as outpatient  3.  HL  COntinue lipitor Wll need to be followed.  D/C today.   LOS:  3 days   Dietrich Pates 11/21/2011, 8:30 AM

## 2011-11-21 NOTE — Discharge Summary (Signed)
Discharge Summary   Patient ID: [name] MRN: [id number], DOB/AGE: 47/03/1965 47 y.o. Admit date: 11/18/2011 D/C date:     11/21/2011  Primary Cardiologist: Jens Som  Primary Discharge Diagnoses:  1. NSTEMI with cath 11/20/11 demonstrating no CAD but did have coronary spasm on first injection of left coronary artery 2. Hypertensive urgency 3. Mildly prominent 11mm mediastinal lymph node on CT angio - may need f/u 6 months 4. Abnormal TSH - free hormones drawn day of discharge 5. LBBB 6. H/o hyperlipidemia - statin initiated thus may need OP followup labs  Secondary Discharge Diagnoses:  1. Anxiety 2. H/o medical noncompliance 3. H/o SVT - wide complex tachycardia of same QRS as intinsic LBBB, declined ablation in past 4. Asthma  Hospital Course: 46yo female with a histofy of HTN, SVT declined ablation), anxiety, LBBB, atypical CP with history of low risk myoview a year ago and medical noncompliance who presented to the ER with complaints of chest tightness on and off for the last week which became constant on day of admission. She took the kids to the bus stop that morning and went home and sat in a chair and pain reoccurred. She slept most of the afternoon but the pain did not go away. She developed R arm numbness and SOB so presented to the ER where initial troponin was elevated at 0.29. On arrival in ER her BP was 203/144mmHg and came down with NTG gtt. EKG showed NSR with inferolateral T wave abnormality c/w ischemia more pronounced than EKG 03-20-11. She was admitted for hypertensive urgency and elevated cardiac enzymes. IV heparin was added. CT angio of chest was obtained showed no aortic dissection or acute intrathoracic process. It did show LVH and minimal aortic valve calcification, mildly prominent mediastinal lymph node 11mm short axis with consideration for possible 6 month follow-up. Statin was added and BB was increased. NTG was weaned. Troponin peak was 1.01. She underwent  cardiac cath 8/29 demonstrating widely patent coronary arteries and normal LV function. She was noted to have diffuse coronary vasospasm on the first injection left coronary artery which responded well to nitroglycerin. Dr. Excell Seltzer started her on vasodilator therapy as part of her hypertension treatment as coronary spasm may have contributed to her symptoms. Today she is feeling well. Dr. Tenny Craw has seen and examined her and feels she is stable for discharge.  TSH was elevated this admission -  will obtain free T4 and T3 prior to discharge given that patient does not have PCP (was [facility]). She was also instructed to establish care with primary doctor to determine further w/u of mediastinal lymph node. I discussed this finding personally with her.   Discharge Vitals: Blood pressure 138/91, pulse 76, temperature 98.8 F (37.1 C), temperature source Oral, resp. rate 18, height 5\' 1"  (1.549 m), weight 198 lb 10.2 oz (90.1 kg), last menstrual period 11/13/2011, SpO2 100.00%.  Labs: Lab Results  Component Value Date   WBC 5.9 11/21/2011   HGB 11.0* 11/21/2011   HCT 33.7* 11/21/2011   MCV 86.4 11/21/2011   PLT 195 11/21/2011    Lab 11/21/11 0500 11/19/11 0312  NA 137 --  K 3.9 --  CL 104 --  CO2 23 --  BUN 17 --  CREATININE 0.97 --  CALCIUM 9.0 --  PROT -- 7.4  BILITOT -- 0.5  ALKPHOS -- 61  ALT -- 11  AST -- 20  GLUCOSE 82 --    Basename 11/19/11 1519 11/19/11 0946 11/19/11 0311 11/18/11 2257  CKTOTAL 119 143 145 --  CKMB 6.1* 7.9* 7.0* --  TROPONINI 0.77* 1.01* 0.83* 0.40*   Lab Results  Component Value Date   CHOL 224* 10/23/2010   HDL 61 10/23/2010   LDLCALC 536* 10/23/2010   TRIG 130 10/23/2010     Diagnostic Studies/Procedures   Dg Chest 2 View 11/18/2011  *RADIOLOGY REPORT*  Clinical Data: Recurring chest pain  CHEST - 2 VIEW  Comparison: 10/22/2010  Findings: Lungs are essentially clear.  No pleural effusion or pneumothorax.  The heart is top normal in size/mildly enlarged,  unchanged.  Mild degenerative changes of the visualized thoracolumbar spine.  IMPRESSION: No evidence of acute cardiopulmonary disease.  Stable borderline cardiomegaly.   Original Report Authenticated By: Charline Bills, M.D.    Ct Angio Chest Aortic Dissect W &/or W/o 11/19/2011  *RADIOLOGY REPORT*  Clinical Data: Chest pain, hypertension.  RADIOLOGY EXAMINATION  Technique:  Unenhanced and post contrast images through the chest following 100 ml Omnipaque 350 intravenous contrast.  Comparison: 11/18/2011 radiograph  Findings: Normal caliber aorta.  Mild left ventricular hypertrophy. Minimal aortic valve calcification.  No pericardial effusion.  No pleural effusions.  Nonspecific mildly prominent pretracheal lymph nodes, measuring up to 11 mm short axis.  Limited images through the upper abdomen demonstrate no acute finding.  Clear lungs.  Central airways are patent.  No pneumothorax. Minimal linear left lower lobe scarring.  No acute osseous finding.  IMPRESSION: No aortic dissection or acute intrathoracic process.  Left ventricular hypertrophy and minimal aortic valve calcification.  Mildly prominent mediastinal lymph node measuring 11 mm short axis. This is nonspecific.  Consider a 55-month follow-up.   Original Report Authenticated By: Waneta Martins, M.D.    Cardiac catheterization this admission, please see full report and above for summary.  Discharge Medications   Current Discharge Medication List    START taking these medications   Details  atorvastatin (LIPITOR) 10 MG tablet Take 1 tablet (10 mg total) by mouth at bedtime. Qty: 30 tablet, Refills: 6    isosorbide mononitrate (IMDUR) 60 MG 24 hr tablet Take 1 tablet (60 mg total) by mouth daily. Qty: 30 tablet, Refills: 6    nitroGLYCERIN (NITROSTAT) 0.4 MG SL tablet Place 1 tablet (0.4 mg total) under the tongue every 5 (five) minutes x 3 doses as needed for chest pain. Qty: 25 tablet, Refills: 4      CONTINUE these medications  which have CHANGED   Details  carvedilol (COREG) 25 MG tablet Take 1 tablet (25 mg total) by mouth 2 (two) times daily with a meal. Qty: 60 tablet, Refills: 6      CONTINUE these medications which have NOT CHANGED   Details  albuterol (PROVENTIL HFA;VENTOLIN HFA) 108 (90 BASE) MCG/ACT inhaler Inhale 2 puffs into the lungs every 6 (six) hours as needed. For wheezing    aspirin 81 MG EC tablet Take 81 mg by mouth daily.      Iron 66 MG TABS Take 1 tablet by mouth daily.      lisinopril (PRINIVIL,ZESTRIL) 40 MG tablet Take 40 mg by mouth daily.      naphazoline-glycerin (CLEAR EYES) 0.012-0.2 % SOLN Place 1-2 drops into both eyes every 4 (four) hours as needed. For itchy eyes    verapamil (COVERA HS) 240 MG (CO) 24 hr tablet Take 240 mg by mouth at bedtime.          Disposition   The patient will be discharged in stable condition to home. Discharge Orders  Future Appointments: Provider: Department: Dept Phone: Center:   12/02/2011 8:30 AM Beatrice Lecher, PA [facility] [phone] [facility]     Future Orders Please Complete By Expires   Diet - low sodium heart healthy      Increase activity slowly      Comments:   No driving for 1 week. No lifting over 5 lbs for 1 week. No sexual activity for 1 week. You may return to work on 11/28/11. Keep procedure site clean & dry. If you notice increased pain, swelling, bleeding or pus, call/return!  You may shower, but no soaking baths/hot tubs/pools for 1 week.     Follow-up Information    Follow up with Tereso Newcomer, PA. (12/02/11 at 8:30am)    Contact information:   [address] [place] [address] [phone]       Follow up with Primary Doctor. (Your Ct scan showed a mildly enlarged lymph node with recommendation to consider 6 month followup.)            Duration of Discharge Encounter: Greater than 30 minutes including physician and PA time.  Signed, Ronie Spies  PA-C 11/21/2011, 9:58 AM

## 2011-11-27 ENCOUNTER — Other Ambulatory Visit: Payer: Self-pay | Admitting: Physician Assistant

## 2011-11-27 NOTE — Progress Notes (Signed)
Patient ID: [name], female   DOB: [birth year], 46 y.o.   MRN: [id number] Discussed TSH, free T4 and T3 with Dr. Jens Som. No intervention felt necessary for right now but pt will be called and instructed to f/u with PCP once established. May need repeat labs and if still abnormal may require eventual tx for subclinical hypothyroidism but will defer to PCP. Dayna Dunn PA-C

## 2011-12-02 ENCOUNTER — Encounter: Payer: Self-pay | Admitting: Physician Assistant

## 2011-12-11 ENCOUNTER — Telehealth: Payer: Self-pay | Admitting: *Deleted

## 2011-12-11 NOTE — Telephone Encounter (Signed)
Message copied by Deveron Furlong on Thu Dec 11, 2011 10:42 AM ------      Message from: Laurann Montana      Created: Thu Nov 27, 2011 11:35 AM      Regarding: Thyroid hormone tests       Hi [name]! It's [name]. [name] is a pt that I discharged a few days ago who we drew thyroid labs on, because she is in that [employer]. I discussed them with Dr. Jens Som - no intervention needed at present, he feels she can have these addressed/repeated once she establishes care with a PCP. Could you call the patient and let her know to f/u PCP? I would really appreciate it. Let me know that you got this message. If you can't call her, let me know.      [name]

## 2011-12-11 NOTE — Telephone Encounter (Signed)
Unable to reach pt. LMTCB.

## 2011-12-17 ENCOUNTER — Telehealth: Payer: Self-pay | Admitting: Internal Medicine

## 2011-12-17 NOTE — Telephone Encounter (Signed)
Patient returning nurse call, she can be reached at hm# [phone]

## 2011-12-17 NOTE — Telephone Encounter (Signed)
Pt informed her thyroid lab results were abn and she needed to establish with pcp to continue f/u and poss medications, pt has not found pcp yet but is looking. Told her to let us know when she does and we will forward labs from hospital, pt agreed to plan and I reinforced importance of establishing with pcp.

## 2012-06-05 ENCOUNTER — Emergency Department (HOSPITAL_COMMUNITY): Payer: Self-pay

## 2012-06-05 ENCOUNTER — Emergency Department (HOSPITAL_COMMUNITY)
Admission: EM | Admit: 2012-06-05 | Discharge: 2012-06-05 | Disposition: A | Discharge status: Home / Self Care | Payer: Self-pay | Attending: Emergency Medicine | Admitting: Emergency Medicine

## 2012-06-05 ENCOUNTER — Encounter (HOSPITAL_COMMUNITY): Payer: Self-pay | Admitting: Emergency Medicine

## 2012-06-05 DIAGNOSIS — R1011 Right upper quadrant pain: Secondary | ICD-10-CM | POA: Insufficient documentation

## 2012-06-05 DIAGNOSIS — Z8659 Personal history of other mental and behavioral disorders: Secondary | ICD-10-CM | POA: Insufficient documentation

## 2012-06-05 DIAGNOSIS — I10 Essential (primary) hypertension: Secondary | ICD-10-CM | POA: Insufficient documentation

## 2012-06-05 DIAGNOSIS — E785 Hyperlipidemia, unspecified: Secondary | ICD-10-CM | POA: Insufficient documentation

## 2012-06-05 DIAGNOSIS — J45909 Unspecified asthma, uncomplicated: Secondary | ICD-10-CM | POA: Insufficient documentation

## 2012-06-05 DIAGNOSIS — Z7982 Long term (current) use of aspirin: Secondary | ICD-10-CM | POA: Insufficient documentation

## 2012-06-05 DIAGNOSIS — Z8679 Personal history of other diseases of the circulatory system: Secondary | ICD-10-CM | POA: Insufficient documentation

## 2012-06-05 DIAGNOSIS — Z87891 Personal history of nicotine dependence: Secondary | ICD-10-CM | POA: Insufficient documentation

## 2012-06-05 DIAGNOSIS — Z79899 Other long term (current) drug therapy: Secondary | ICD-10-CM | POA: Insufficient documentation

## 2012-06-05 DIAGNOSIS — E669 Obesity, unspecified: Secondary | ICD-10-CM | POA: Insufficient documentation

## 2012-06-05 LAB — CBC WITH DIFFERENTIAL/PLATELET
Eosinophils Absolute: 0.3 10*3/uL (ref 0.0–0.7)
HCT: 39.5 % (ref 36.0–46.0)
Hemoglobin: 12.6 g/dL (ref 12.0–15.0)
Lymphs Abs: 1.3 10*3/uL (ref 0.7–4.0)
MCH: 26.6 pg (ref 26.0–34.0)
Monocytes Relative: 11 % (ref 3–12)
Neutro Abs: 3.7 10*3/uL (ref 1.7–7.7)
Neutrophils Relative %: 62 % (ref 43–77)
RBC: 4.73 MIL/uL (ref 3.87–5.11)

## 2012-06-05 LAB — COMPREHENSIVE METABOLIC PANEL
AST: 18 U/L (ref 0–37)
CO2: 21 mEq/L (ref 19–32)
Calcium: 9.2 mg/dL (ref 8.4–10.5)
Creatinine, Ser: 0.7 mg/dL (ref 0.50–1.10)
GFR calc non Af Amer: >90 mL/min (ref >90)
Total Protein: 7.4 g/dL (ref 6.0–8.3)

## 2012-06-05 LAB — LIPASE, BLOOD: Lipase: 27 U/L (ref 11–59)

## 2012-06-05 LAB — D-DIMER, QUANTITATIVE: D-Dimer, Quant: 0.54 ug/mL-FEU — ABNORMAL HIGH (ref 0.00–0.48)

## 2012-06-05 NOTE — ED Provider Notes (Signed)
History     CSN: [id number]  Arrival date & time 06/05/12  7829   First MD Initiated Contact with Patient 06/05/12 [phone]      Chief Complaint  Patient presents with  . Abdominal Pain    (Consider location/radiation/quality/duration/timing/severity/associated sxs/prior treatment) HPI Comments: [name] is a 47 y.o. female who complains of right upper quadrant abdominal pain present for 3 days, constantly, worsened by lying supine. The pain is described as "pulling" . There is no radiation of the pain. She denies fever, chills, cough, shortness of breath with this or dizziness. No change in bowel or urinary habits. There are no other modifying factors. No prior similar problem.  Patient is a 47 y.o. female presenting with abdominal pain. The history is provided by the patient.  Abdominal Pain   Past Medical History  Diagnosis Date  . Anxiety   . Hypertension   . SVT (supraventricular tachycardia) 10/22/10    wide complex tachycardia of same QRS as intinsic LBBB  . LBBB (left bundle branch block)   . Noncompliance   . Hyperlipidemia   . Asthma   . Atypical chest pain     recent low risk myoview  . Coronary artery spasm     a. NSTEMI 10/2011 with no CAD by cath but evidence of spasm when injecting left coronary artery.   . Abnormal TSH     10/2011 - additional hormones pending    Past Surgical History  Procedure Laterality Date  . Tubal ligation      Family History  Problem Relation Age of Onset  . Stroke Mother     History  Substance Use Topics  . Smoking status: Former Smoker    Quit date: 11/06/1996  . Smokeless tobacco: Never Used  . Alcohol Use: 3.0 oz/week    5 Cans of beer per week     Comment: heavy ETOH (1 pint of liquor most days) until last visit.  states that she no longer drinks ETOH    OB History   Grav Para Term Preterm Abortions TAB SAB Ect Mult Living                  Review of Systems  Gastrointestinal: Positive for abdominal pain.   All other systems reviewed and are negative.    Allergies  Penicillins  Home Medications   Current Outpatient Rx  Name  Route  Sig  Dispense  Refill  . albuterol (PROVENTIL HFA;VENTOLIN HFA) 108 (90 BASE) MCG/ACT inhaler   Inhalation   Inhale 2 puffs into the lungs every 6 (six) hours as needed. For wheezing         . aspirin 81 MG EC tablet   Oral   Take 81 mg by mouth daily.           . carvedilol (COREG) 25 MG tablet   Oral   Take 1 tablet (25 mg total) by mouth 2 (two) times daily with a meal.   60 tablet   6   . ibuprofen (ADVIL,MOTRIN) 200 MG tablet   Oral   Take 800 mg by mouth every 8 (eight) hours as needed for pain.         . Iron 66 MG TABS   Oral   Take 1 tablet by mouth daily.           . isosorbide mononitrate (IMDUR) 60 MG 24 hr tablet   Oral   Take 1 tablet (60 mg total) by mouth daily.  30 tablet   6   . naphazoline-glycerin (CLEAR EYES) 0.012-0.2 % SOLN   Both Eyes   Place 1-2 drops into both eyes every 4 (four) hours as needed. For itchy eyes         . nitroGLYCERIN (NITROSTAT) 0.4 MG SL tablet   Sublingual   Place 1 tablet (0.4 mg total) under the tongue every 5 (five) minutes x 3 doses as needed for chest pain.   25 tablet   4   . verapamil (COVERA HS) 240 MG (CO) 24 hr tablet   Oral   Take 240 mg by mouth at bedtime.          [name] atorvastatin (LIPITOR) 10 MG tablet   Oral   Take 1 tablet (10 mg total) by mouth at bedtime.   30 tablet   6   . lisinopril (PRINIVIL,ZESTRIL) 40 MG tablet   Oral   Take 40 mg by mouth daily.             BP 150/105  Pulse 72  Temp(Src) 98.9 F (37.2 C) (Oral)  Resp 16  SpO2 97%  LMP 05/14/2012  Physical Exam  Nursing note and vitals reviewed. Constitutional: She is oriented to person, place, and time. She appears well-developed. No distress.  Obese  HENT:  Head: Normocephalic and atraumatic.  Eyes: Conjunctivae and EOM are normal. Pupils are equal, round, and reactive to  light.  Neck: Normal range of motion and phonation normal. Neck supple.  Cardiovascular: Normal rate, regular rhythm and intact distal pulses.   Pulmonary/Chest: Effort normal and breath sounds normal. She exhibits no tenderness.  Abdominal: Soft. She exhibits no distension and no mass. There is tenderness (Right upper quadrant, mild). There is no rebound and no guarding.  Musculoskeletal: Normal range of motion.  Neurological: She is alert and oriented to person, place, and time. She has normal strength. She exhibits normal muscle tone.  Skin: Skin is warm and dry.  Psychiatric: She has a normal mood and affect. Her behavior is normal. Judgment and thought content normal.    ED Course  Procedures (including critical care time)  She was offered pain medication, and declined it.   Labs Reviewed  COMPREHENSIVE METABOLIC PANEL - Abnormal; Notable for the following:    Total Bilirubin 0.2 (*)    All other components within normal limits  D-DIMER, QUANTITATIVE - Abnormal; Notable for the following:    D-Dimer, Quant 0.54 (*)    All other components within normal limits  CBC WITH DIFFERENTIAL  LIPASE, BLOOD   Dg Chest 2 View  06/05/2012  *RADIOLOGY REPORT*  Clinical Data: Abdominal pain  CHEST - 2 VIEW  Comparison: 11/18/2011  Findings: Mild cardiomegaly.  Clear lungs.  No pleural effusion. No pneumothorax.  Stable thoracic spine.  IMPRESSION: No active cardiopulmonary disease.   Original Report Authenticated By: Jolaine Click, M.D.    US Abdomen Complete  06/05/2012   *RADIOLOGY REPORT*  Clinical Data:  Right upper quadrant abdominal pain.  COMPLETE ABDOMINAL ULTRASOUND  Comparison:  CT abdomen and pelvis 11/19/2011  Findings:  Gallbladder:  No shadowing gallstones or echogenic sludge.  No gallbladder wall thickening or pericholecystic fluid.  No sonographic Murphy's sign according to the ultrasound technologist. Wall thickness is 2.0 mm, within normal limits.  Common bile duct:  Normal in  caliber. No biliary ductal dilation. The maximal diameter is at the upper limits of normal, 6 mm.  Liver:  No focal lesion identified.  Within normal limits in parenchymal  echogenicity.  IVC:  Appears normal.  Pancreas:  Although the pancreas is difficult to visualize in its entirety, no focal pancreatic abnormality is identified.  Spleen:  Normal size and echotexture without focal parenchymal abnormality.  The maximal length is 5.4 cm, within normal limits.  Right Kidney:  No hydronephrosis.  Well-preserved cortex.  Normal size and parenchymal echotexture without focal abnormalities. The maximal length is 13.8 cm, within normal limits.  Left Kidney:  No hydronephrosis.  Well-preserved cortex.  Normal size and parenchymal echotexture without focal abnormalities. The maximal length is 11.2 cm, within normal limits.  Abdominal aorta:  No aneurysm identified.  IMPRESSION: Negative abdominal ultrasound.   Original Report Authenticated By: Marin Roberts, M.D.    Nursing Notes Reviewed/ Care Coordinated, and agree without changes. Applicable Imaging Report Reviewed- chest x-ray today Viewed; no acute abnormality Interpretation of Laboratory Data incorporated into ED treatment  1. Abdominal pain       MDM  Nonspecific right upper quadrant pain, without evidence acute cholecystitis, urinary tract infection, kidney stone, PE, pneumonia, or suspected GI source. Note that her d-dimer is below the cutoff of 1.0 mcg per mL. She is stable for discharge    Plan: Home Medications- Tylenol; Home Treatments- Rest heat; Recommended follow up- PCP       Flint Melter, MD 06/05/12 1557

## 2012-06-05 NOTE — ED Notes (Signed)
Pt presents w/ RUQ pain, denies nausea or emesis. Pain is worse w/ movement, breath, and deep breathe. Pt is hoarse and croupy w/ cough since Tuesday, pain started on Tuesday as well.

## 2012-06-05 NOTE — ED Notes (Signed)
Patient transported to Ultrasound 

## 2012-06-05 NOTE — ED Notes (Signed)
Ultrasound bedside.

## 2012-06-05 NOTE — ED Notes (Signed)
Reports side is hurting however points under right breast and RUQ. Pain worse when taking a deep breath.

## 2012-08-16 ENCOUNTER — Encounter (HOSPITAL_COMMUNITY): Payer: Self-pay | Admitting: Emergency Medicine

## 2012-08-16 ENCOUNTER — Emergency Department (HOSPITAL_COMMUNITY)
Admission: EM | Admit: 2012-08-16 | Discharge: 2012-08-16 | Disposition: A | Discharge status: Home / Self Care | Payer: Self-pay | Attending: Emergency Medicine | Admitting: Emergency Medicine

## 2012-08-16 DIAGNOSIS — I498 Other specified cardiac arrhythmias: Secondary | ICD-10-CM | POA: Insufficient documentation

## 2012-08-16 DIAGNOSIS — I10 Essential (primary) hypertension: Secondary | ICD-10-CM | POA: Insufficient documentation

## 2012-08-16 DIAGNOSIS — Z8659 Personal history of other mental and behavioral disorders: Secondary | ICD-10-CM | POA: Insufficient documentation

## 2012-08-16 DIAGNOSIS — Z7982 Long term (current) use of aspirin: Secondary | ICD-10-CM | POA: Insufficient documentation

## 2012-08-16 DIAGNOSIS — Z79899 Other long term (current) drug therapy: Secondary | ICD-10-CM | POA: Insufficient documentation

## 2012-08-16 DIAGNOSIS — Z8679 Personal history of other diseases of the circulatory system: Secondary | ICD-10-CM | POA: Insufficient documentation

## 2012-08-16 DIAGNOSIS — Z9119 Patient's noncompliance with other medical treatment and regimen: Secondary | ICD-10-CM | POA: Insufficient documentation

## 2012-08-16 DIAGNOSIS — J45909 Unspecified asthma, uncomplicated: Secondary | ICD-10-CM | POA: Insufficient documentation

## 2012-08-16 DIAGNOSIS — Z87891 Personal history of nicotine dependence: Secondary | ICD-10-CM | POA: Insufficient documentation

## 2012-08-16 DIAGNOSIS — R42 Dizziness and giddiness: Secondary | ICD-10-CM | POA: Insufficient documentation

## 2012-08-16 DIAGNOSIS — E785 Hyperlipidemia, unspecified: Secondary | ICD-10-CM | POA: Insufficient documentation

## 2012-08-16 DIAGNOSIS — R51 Headache: Secondary | ICD-10-CM | POA: Insufficient documentation

## 2012-08-16 DIAGNOSIS — Z91199 Patient's noncompliance with other medical treatment and regimen due to unspecified reason: Secondary | ICD-10-CM | POA: Insufficient documentation

## 2012-08-16 DIAGNOSIS — Z880 Allergy status to penicillin: Secondary | ICD-10-CM | POA: Insufficient documentation

## 2012-08-16 LAB — POCT I-STAT, CHEM 8
BUN: 16 mg/dL (ref 6–23)
Creatinine, Ser: 0.8 mg/dL (ref 0.50–1.10)
Hemoglobin: 14.3 g/dL (ref 12.0–15.0)
Potassium: 2.9 mEq/L — ABNORMAL LOW (ref 3.5–5.1)
Sodium: 138 mEq/L (ref 135–145)

## 2012-08-16 MED ORDER — POTASSIUM CHLORIDE CRYS ER 20 MEQ PO TBCR
60.0000 meq | EXTENDED_RELEASE_TABLET | Freq: Once | ORAL | Status: AC
  Administered 2012-08-16: 60 meq via ORAL
  Filled 2012-08-16: qty 3

## 2012-08-16 MED ORDER — OXYCODONE-ACETAMINOPHEN 5-325 MG PO TABS
2.0000 | ORAL_TABLET | Freq: Once | ORAL | Status: AC
  Administered 2012-08-16: 2 via ORAL
  Filled 2012-08-16: qty 2

## 2012-08-16 MED ORDER — LISINOPRIL 20 MG PO TABS: 40.0000 mg | ORAL_TABLET | Freq: Every day | ORAL | Status: DC

## 2012-08-16 MED ORDER — CLONIDINE HCL 0.1 MG PO TABS
0.1000 mg | ORAL_TABLET | Freq: Once | ORAL | Status: AC
  Administered 2012-08-16: 0.1 mg via ORAL
  Filled 2012-08-16: qty 1

## 2012-08-16 MED ORDER — OXYCODONE-ACETAMINOPHEN 5-325 MG PO TABS
2.0000 | ORAL_TABLET | Freq: Once | ORAL | Status: DC
  Filled 2012-08-16: qty 2

## 2012-08-16 MED ORDER — CARVEDILOL 25 MG PO TABS: 25.0000 mg | ORAL_TABLET | Freq: Two times a day (BID) | ORAL | Status: DC

## 2012-08-16 MED ORDER — ISOSORBIDE MONONITRATE ER 60 MG PO TB24: 60.0000 mg | ORAL_TABLET | Freq: Every day | ORAL | Status: DC

## 2012-08-16 MED ORDER — VERAPAMIL HCL 240 MG (CO) PO TB24: 240.0000 mg | ORAL_TABLET | Freq: Every day | ORAL | Status: DC

## 2012-08-16 NOTE — ED Notes (Signed)
Pt escorted to discharge window. Pt verbalized understanding discharge instructions. In no acute distress.  

## 2012-08-16 NOTE — ED Notes (Signed)
Pt sts "I recently ran out of all of my meds, but I can't see my doctor until June 11th."

## 2012-08-16 NOTE — ED Provider Notes (Signed)
History     CSN: [id number]  Arrival date & time 08/16/12  2952   First MD Initiated Contact with Patient 08/16/12 [phone]      Chief Complaint  Patient presents with  . Headache  . Dizziness    (Consider location/radiation/quality/duration/timing/severity/associated sxs/prior treatment) HPI Comments: Patient presents with a bilateral headache. She states it's been gradually worsening for the last 4 days. She describes as a throbbing pain behind her eyes and mostly in the frontal part of her head. It also radiates to the back of her head. She denies he fevers or chills. She denies any photophobia. She denies any nausea or vomiting. She does have some lightheadedness. She denies any numbness or weakness in her extremities. She denies any chest pain or shortness of breath. She has a history of hypertension and has been off her medicines for about one month. She has an appointment next week [facility]. She states that she's had similar headaches when her blood pressures been elevated in the past.  Patient is a 47 y.o. female presenting with headaches.  Headache Associated symptoms: no abdominal pain, no back pain, no congestion, no cough, no diarrhea, no dizziness, no fatigue, no fever, no nausea, no numbness and no vomiting     Past Medical History  Diagnosis Date  . Anxiety   . Hypertension   . SVT (supraventricular tachycardia) 10/22/10    wide complex tachycardia of same QRS as intinsic LBBB  . LBBB (left bundle branch block)   . Noncompliance   . Hyperlipidemia   . Asthma   . Atypical chest pain     recent low risk myoview  . Coronary artery spasm     a. NSTEMI 10/2011 with no CAD by cath but evidence of spasm when injecting left coronary artery.   . Abnormal TSH     10/2011 - additional hormones pending    Past Surgical History  Procedure Laterality Date  . Tubal ligation      Family History  Problem Relation Age of Onset  . Stroke Mother      History  Substance Use Topics  . Smoking status: Former Smoker    Quit date: 11/06/1996  . Smokeless tobacco: Never Used  . Alcohol Use: 3.0 oz/week    5 Cans of beer per week     Comment: heavy ETOH (1 pint of liquor most days) until last visit.  states that she no longer drinks ETOH    OB History   Grav Para Term Preterm Abortions TAB SAB Ect Mult Living                  Review of Systems  Constitutional: Negative for fever, chills, diaphoresis and fatigue.  HENT: Negative for congestion, rhinorrhea and sneezing.   Eyes: Negative.   Respiratory: Negative for cough, chest tightness and shortness of breath.   Cardiovascular: Negative for chest pain and leg swelling.  Gastrointestinal: Negative for nausea, vomiting, abdominal pain, diarrhea and blood in stool.  Genitourinary: Negative for frequency, hematuria, flank pain and difficulty urinating.  Musculoskeletal: Negative for back pain and arthralgias.  Skin: Negative for rash.  Neurological: Positive for light-headedness and headaches. Negative for dizziness, speech difficulty, weakness and numbness.    Allergies  Penicillins and Shellfish allergy  Home Medications   Current Outpatient Rx  Name  Route  Sig  Dispense  Refill  . albuterol (PROVENTIL HFA;VENTOLIN HFA) 108 (90 BASE) MCG/ACT inhaler   Inhalation   Inhale  2 puffs into the lungs every 6 (six) hours as needed. For wheezing         . aspirin 81 MG EC tablet   Oral   Take 81 mg by mouth every morning.          [name] atorvastatin (LIPITOR) 10 MG tablet   Oral   Take 1 tablet (10 mg total) by mouth at bedtime.   30 tablet   6   . carvedilol (COREG) 25 MG tablet   Oral   Take 1 tablet (25 mg total) by mouth 2 (two) times daily with a meal.   60 tablet   6   . ibuprofen (ADVIL,MOTRIN) 200 MG tablet   Oral   Take 800 mg by mouth every 6 (six) hours as needed for headache.          . Iron 66 MG TABS   Oral   Take 65 mg by mouth every morning.           . isosorbide mononitrate (IMDUR) 60 MG 24 hr tablet   Oral   Take 60 mg by mouth every morning.         [name] lisinopril (PRINIVIL,ZESTRIL) 40 MG tablet   Oral   Take 40 mg by mouth every morning.          . naphazoline-glycerin (CLEAR EYES) 0.012-0.2 % SOLN   Both Eyes   Place 1-2 drops into both eyes every 4 (four) hours as needed. For itchy eyes         . nitroGLYCERIN (NITROSTAT) 0.4 MG SL tablet   Sublingual   Place 1 tablet (0.4 mg total) under the tongue every 5 (five) minutes x 3 doses as needed for chest pain.   25 tablet   4   . verapamil (COVERA HS) 240 MG (CO) 24 hr tablet   Oral   Take 240 mg by mouth at bedtime.          . carvedilol (COREG) 25 MG tablet   Oral   Take 1 tablet (25 mg total) by mouth 2 (two) times daily with a meal.   60 tablet   0   . isosorbide mononitrate (IMDUR) 60 MG 24 hr tablet   Oral   Take 1 tablet (60 mg total) by mouth daily.   30 tablet   0   . lisinopril (PRINIVIL,ZESTRIL) 20 MG tablet   Oral   Take 2 tablets (40 mg total) by mouth daily.   30 tablet   0   . verapamil (COVERA-HS) 240 MG (CO) 24 hr tablet   Oral   Take 1 tablet (240 mg total) by mouth at bedtime.   30 tablet   0     BP 194/107  Pulse 70  Temp(Src) 98.9 F (37.2 C) (Oral)  Resp 15  Ht 5\' 1"  (1.549 m)  Wt 200 lb (90.719 kg)  BMI 37.81 kg/m2  SpO2 99%  LMP 08/02/2012  Physical Exam  Constitutional: She is oriented to person, place, and time. She appears well-developed and well-nourished.  HENT:  Head: Normocephalic and atraumatic.  Mouth/Throat: Oropharynx is clear and moist.  Eyes: Pupils are equal, round, and reactive to light.  No photophobia. Fundi are not clearly visualized  Neck: Normal range of motion. Neck supple.  No meningeal signs  Cardiovascular: Normal rate, regular rhythm and normal heart sounds.   Pulmonary/Chest: Effort normal and breath sounds normal. No respiratory distress. She has no wheezes. She has no rales.  She  exhibits no tenderness.  Abdominal: Soft. Bowel sounds are normal. There is no tenderness. There is no rebound and no guarding.  Musculoskeletal: Normal range of motion. She exhibits no edema.  Lymphadenopathy:    She has no cervical adenopathy.  Neurological: She is alert and oriented to person, place, and time. She has normal strength. No cranial nerve deficit or sensory deficit. GCS eye subscore is 4. GCS verbal subscore is 5. GCS motor subscore is 6.  Skin: Skin is warm and dry. No rash noted.  Psychiatric: She has a normal mood and affect.    ED Course  Procedures (including critical care time)   Date: 08/16/2012  Rate: 64  Rhythm: normal sinus rhythm  QRS Axis: normal  Intervals: normal  ST/T Wave abnormalities: nonspecific ST/T changes  Conduction Disutrbances:none  Narrative Interpretation:   Old EKG Reviewed: unchanged  Results for orders placed during the hospital encounter of 08/16/12  POCT I-STAT, CHEM 8      Result Value Range   Sodium 138  135 - 145 mEq/L   Potassium 2.9 (*) 3.5 - 5.1 mEq/L   Chloride 104  96 - 112 mEq/L   BUN 16  6 - 23 mg/dL   Creatinine, Ser 2.95  0.50 - 1.10 mg/dL   Glucose, Bld 621 (*) 70 - 99 mg/dL   Calcium, Ion 3.08  6.57 - 1.23 mmol/L   TCO2 25  0 - 100 mmol/L   Hemoglobin 14.3  12.0 - 15.0 g/dL   HCT 84.6  96.2 - 95.2 %   No results found.   1. HTN (hypertension)   2. Headache       MDM  Patient was given a dose of Percocet as well as a dose of clonidine. Her blood pressure is starting to come down. She states her headache is feeling much better. She has no other symptoms suggestive of subarachnoid hemorrhage or meningitis. Her potassium is slightly low and she was given a dose of potassium. She has an appointment next week to get established with [facility]. I gave her a one-month prescription of her medications. Advised to return here for symptoms worsen.        Rolan Bucco, MD 08/16/12 1014

## 2012-08-16 NOTE — ED Notes (Signed)
Pt c/o headache x 4 days.  States the headache has been consistent and has not gone away.  States that when she got out of bed this morning, she felt lightheaded.  Feels not as lightheaded right now.  Headache pain 6/10.  Diarrhea also since Friday.  Denies NV.  Pt does not appear to be in any distress at the moment.

## 2013-01-10 ENCOUNTER — Emergency Department (HOSPITAL_COMMUNITY)
Admission: EM | Admit: 2013-01-10 | Discharge: 2013-01-10 | Disposition: A | Discharge status: Home / Self Care | Payer: No Typology Code available for payment source | Attending: Emergency Medicine | Admitting: Emergency Medicine

## 2013-01-10 ENCOUNTER — Encounter (HOSPITAL_COMMUNITY): Payer: Self-pay | Admitting: Emergency Medicine

## 2013-01-10 DIAGNOSIS — R110 Nausea: Secondary | ICD-10-CM | POA: Insufficient documentation

## 2013-01-10 DIAGNOSIS — Z880 Allergy status to penicillin: Secondary | ICD-10-CM | POA: Insufficient documentation

## 2013-01-10 DIAGNOSIS — Z79899 Other long term (current) drug therapy: Secondary | ICD-10-CM | POA: Insufficient documentation

## 2013-01-10 DIAGNOSIS — I10 Essential (primary) hypertension: Secondary | ICD-10-CM | POA: Insufficient documentation

## 2013-01-10 DIAGNOSIS — Z9119 Patient's noncompliance with other medical treatment and regimen: Secondary | ICD-10-CM | POA: Insufficient documentation

## 2013-01-10 DIAGNOSIS — Z87891 Personal history of nicotine dependence: Secondary | ICD-10-CM | POA: Insufficient documentation

## 2013-01-10 DIAGNOSIS — R51 Headache: Secondary | ICD-10-CM | POA: Insufficient documentation

## 2013-01-10 DIAGNOSIS — E785 Hyperlipidemia, unspecified: Secondary | ICD-10-CM | POA: Insufficient documentation

## 2013-01-10 DIAGNOSIS — J45909 Unspecified asthma, uncomplicated: Secondary | ICD-10-CM | POA: Insufficient documentation

## 2013-01-10 DIAGNOSIS — Z91199 Patient's noncompliance with other medical treatment and regimen due to unspecified reason: Secondary | ICD-10-CM | POA: Insufficient documentation

## 2013-01-10 DIAGNOSIS — Z791 Long term (current) use of non-steroidal anti-inflammatories (NSAID): Secondary | ICD-10-CM | POA: Insufficient documentation

## 2013-01-10 DIAGNOSIS — I498 Other specified cardiac arrhythmias: Secondary | ICD-10-CM | POA: Insufficient documentation

## 2013-01-10 DIAGNOSIS — F411 Generalized anxiety disorder: Secondary | ICD-10-CM | POA: Insufficient documentation

## 2013-01-10 LAB — COMPREHENSIVE METABOLIC PANEL
ALT: 11 U/L (ref 0–35)
Alkaline Phosphatase: 74 U/L (ref 39–117)
BUN: 15 mg/dL (ref 6–23)
CO2: 22 mEq/L (ref 19–32)
Chloride: 101 mEq/L (ref 96–112)
GFR calc Af Amer: >90 mL/min (ref >90)
GFR calc non Af Amer: >90 mL/min (ref >90)
Glucose, Bld: 120 mg/dL — ABNORMAL HIGH (ref 70–99)
Potassium: 3.4 mEq/L — ABNORMAL LOW (ref 3.5–5.1)
Total Bilirubin: 0.3 mg/dL (ref 0.3–1.2)
Total Protein: 7.2 g/dL (ref 6.0–8.3)

## 2013-01-10 LAB — CBC WITH DIFFERENTIAL/PLATELET
Eosinophils Absolute: 0.2 10*3/uL (ref 0.0–0.7)
Hemoglobin: 12.9 g/dL (ref 12.0–15.0)
Lymphocytes Relative: 16 % (ref 12–46)
Lymphs Abs: 1.1 10*3/uL (ref 0.7–4.0)
MCH: 27.3 pg (ref 26.0–34.0)
Monocytes Relative: 8 % (ref 3–12)
Neutrophils Relative %: 74 % (ref 43–77)
RBC: 4.72 MIL/uL (ref 3.87–5.11)
WBC: 7.1 10*3/uL (ref 4.0–10.5)

## 2013-01-10 MED ORDER — CARVEDILOL 25 MG PO TABS
25.0000 mg | ORAL_TABLET | Freq: Two times a day (BID) | ORAL | Status: DC
  Administered 2013-01-10: 25 mg via ORAL
  Filled 2013-01-10 (×2): qty 1

## 2013-01-10 MED ORDER — VERAPAMIL HCL 120 MG PO TABS
120.0000 mg | ORAL_TABLET | Freq: Once | ORAL | Status: AC
  Administered 2013-01-10: 120 mg via ORAL
  Filled 2013-01-10: qty 1

## 2013-01-10 MED ORDER — LISINOPRIL 20 MG PO TABS: 40.0000 mg | ORAL_TABLET | Freq: Every day | ORAL | Status: DC

## 2013-01-10 MED ORDER — KETOROLAC TROMETHAMINE 30 MG/ML IJ SOLN
30.0000 mg | Freq: Once | INTRAMUSCULAR | Status: AC
  Administered 2013-01-10: 30 mg via INTRAVENOUS
  Filled 2013-01-10: qty 1

## 2013-01-10 MED ORDER — SODIUM CHLORIDE 0.9 % IV SOLN: Freq: Once | INTRAVENOUS | Status: DC

## 2013-01-10 MED ORDER — CARVEDILOL 25 MG PO TABS
25.0000 mg | ORAL_TABLET | Freq: Two times a day (BID) | ORAL | Status: DC
  Filled 2013-01-10 (×2): qty 1

## 2013-01-10 MED ORDER — LISINOPRIL 40 MG PO TABS
40.0000 mg | ORAL_TABLET | Freq: Once | ORAL | Status: AC
  Administered 2013-01-10: 40 mg via ORAL
  Filled 2013-01-10: qty 1

## 2013-01-10 NOTE — ED Notes (Signed)
Pt's b/p at discharge is 200/100 manually, PA notified. New order received.

## 2013-01-10 NOTE — ED Provider Notes (Signed)
CSN: [id number]     Arrival date & time 01/10/13  0804 History   First MD Initiated Contact with Patient 01/10/13 [phone]     Chief Complaint  Patient presents with  . Hypertension   (Consider location/radiation/quality/duration/timing/severity/associated sxs/prior Treatment) HPI Comments: The patient presents with a past medical history of LBBB, SVT, and HTN with a chief complaint of high blood pressure.  The patient reports light headedness since midnight when she woke up to go to the restroom.  States she has been noncompliant with BP medication for 2 week and is unsure what her current anti-HTN regimen is. She reports headache. Denies current visual changes, chest pain, dyspnea, abdominal pain.   The history is provided by the patient.    Past Medical History  Diagnosis Date  . Anxiety   . Hypertension   . SVT (supraventricular tachycardia) 10/22/10    wide complex tachycardia of same QRS as intinsic LBBB  . LBBB (left bundle branch block)   . Noncompliance   . Hyperlipidemia   . Asthma   . Atypical chest pain     recent low risk myoview  . Coronary artery spasm     a. NSTEMI 10/2011 with no CAD by cath but evidence of spasm when injecting left coronary artery.   . Abnormal TSH     10/2011 - additional hormones pending   Past Surgical History  Procedure Laterality Date  . Tubal ligation     Family History  Problem Relation Age of Onset  . Stroke Mother    History  Substance Use Topics  . Smoking status: Former Smoker    Quit date: 11/06/1996  . Smokeless tobacco: Never Used  . Alcohol Use: 3.0 oz/week    5 Cans of beer per week     Comment: heavy ETOH (1 pint of liquor most days) until last visit.  states that she no longer drinks ETOH   OB History   Grav Para Term Preterm Abortions TAB SAB Ect Mult Living                 Review of Systems  Allergies  Penicillins and Shellfish allergy  Home Medications   Current Outpatient Rx  Name  Route  Sig  Dispense   Refill  . albuterol (PROVENTIL HFA;VENTOLIN HFA) 108 (90 BASE) MCG/ACT inhaler   Inhalation   Inhale 2 puffs into the lungs every 6 (six) hours as needed. For wheezing         . aspirin 81 MG EC tablet   Oral   Take 81 mg by mouth every morning.          [name] EXPIRED: atorvastatin (LIPITOR) 10 MG tablet   Oral   Take 1 tablet (10 mg total) by mouth at bedtime.   30 tablet   6   . carvedilol (COREG) 25 MG tablet   Oral   Take 1 tablet (25 mg total) by mouth 2 (two) times daily with a meal.   60 tablet   6   . carvedilol (COREG) 25 MG tablet   Oral   Take 1 tablet (25 mg total) by mouth 2 (two) times daily with a meal.   60 tablet   0   . ibuprofen (ADVIL,MOTRIN) 200 MG tablet   Oral   Take 800 mg by mouth every 6 (six) hours as needed for headache.          . Iron 66 MG TABS   Oral   Take 65 mg  by mouth every morning.          . isosorbide mononitrate (IMDUR) 60 MG 24 hr tablet   Oral   Take 60 mg by mouth every morning.         . isosorbide mononitrate (IMDUR) 60 MG 24 hr tablet   Oral   Take 1 tablet (60 mg total) by mouth daily.   30 tablet   0   . lisinopril (PRINIVIL,ZESTRIL) 20 MG tablet   Oral   Take 2 tablets (40 mg total) by mouth daily.   30 tablet   0   . lisinopril (PRINIVIL,ZESTRIL) 40 MG tablet   Oral   Take 40 mg by mouth every morning.          . naphazoline-glycerin (CLEAR EYES) 0.012-0.2 % SOLN   Both Eyes   Place 1-2 drops into both eyes every 4 (four) hours as needed. For itchy eyes         . EXPIRED: nitroGLYCERIN (NITROSTAT) 0.4 MG SL tablet   Sublingual   Place 1 tablet (0.4 mg total) under the tongue every 5 (five) minutes x 3 doses as needed for chest pain.   25 tablet   4   . verapamil (COVERA HS) 240 MG (CO) 24 hr tablet   Oral   Take 240 mg by mouth at bedtime.          . verapamil (COVERA-HS) 240 MG (CO) 24 hr tablet   Oral   Take 1 tablet (240 mg total) by mouth at bedtime.   30 tablet   0    BP  241/136  Pulse 76  Temp(Src) 98.1 F (36.7 C) (Oral)  Resp 20  SpO2 98% Physical Exam  Nursing note and vitals reviewed. Constitutional: She appears well-developed and well-nourished. No distress.  HENT:  Head: Normocephalic and atraumatic.  Neck: Neck supple. No JVD present.  Cardiovascular: Normal rate and regular rhythm.   Pulmonary/Chest: Effort normal and breath sounds normal. No respiratory distress. She has no wheezes. She has no rales.  Abdominal: Soft. Bowel sounds are normal. There is no tenderness. There is no rebound and no guarding.  Musculoskeletal: Normal range of motion. She exhibits no edema.  Neurological: She is alert.  Skin: Skin is warm and dry. No rash noted. She is not diaphoretic.  Psychiatric: She has a normal mood and affect.    ED Course  Procedures (including critical care time) Labs Review Labs Reviewed  COMPREHENSIVE METABOLIC PANEL - Abnormal; Notable for the following:    Potassium 3.4 (*)    Glucose, Bld 120 (*)    All other components within normal limits  CBC WITH DIFFERENTIAL   Imaging Review No results found.  EKG Interpretation     Ventricular Rate:  67 PR Interval:  152 QRS Duration: 102 QT Interval:  433 QTC Calculation: 458 R Axis:   12 Text Interpretation:  Sinus rhythm Anterior infarct, old Nonspecific T abnormalities, lateral leads Baseline wander in lead(s) II III aVL aVF No significant change since last tracing 08/16/12            MDM   1. HTN (hypertension)    The patient presents with a past medical history of f LBBB, SVT, and HTN presents today with elevated BP, reporting systolic 250 this morning.  She has been non compliant with her anti-HTN treatment plan for 2 weeks and has a follow up appointment at the clinic on Thursday.  Will order her home HTN medications and continue  to monitor the patient.   1008 re-eval BP 190/104, patient requesting something for her headache and will give her some toradol.  Pt  reports that she does not have the money to buy HTN medication until Thursday when she goes to the [facility]. Patient is stable for discharge patient home with lisinopril 20 mg tablets, and encouraged patient to keep appointment and refill medication in 2 days.  Encouraged a low sodium heart healthy diet.   Meds given in ED:  Medications  lisinopril (PRINIVIL,ZESTRIL) tablet 40 mg (40 mg Oral Given 01/10/13 0905)  verapamil (CALAN) tablet 120 mg (120 mg Oral Given 01/10/13 0906)  ketorolac (TORADOL) 30 MG/ML injection 30 mg (30 mg Intravenous Given 01/10/13 1055)    Discharge Medication List as of 01/10/2013 11:56 AM    START taking these medications   Details  !! lisinopril (PRINIVIL,ZESTRIL) 20 MG tablet Take 2 tablets (40 mg total) by mouth daily., Starting 01/10/2013, Until Discontinued, Print     !! - Potential duplicate medications found. Please discuss with provider.        Clabe Seal, PA-C 01/12/13 [phone]

## 2013-01-10 NOTE — ED Notes (Signed)
Pt reports feeling lightheaded and nauseated since last night, this morning went to work, was told didn't look well, coworkers checked her b/p, it was 250/120. Pt reports being off of her b/p meds since last week, sts has to see the doctor prior to getting refill.

## 2013-01-10 NOTE — ED Provider Notes (Signed)
47 year old female who is admittedly noncompliant with her medical regimen of antihypertensives presents with a complaint of headache, nausea and severe hypertension. She reports blood pressures upwards of 250/120 prior to arrival, she denies chest pain, difficulty breathing, swelling, numbness or weakness. She has no changes in her vision. On exam the patient is a soft abdomen, clear heart and lung sounds without murmurs, no peripheral edema, normal strength, normal coordination, normal speech. Her EKG is unchanged from prior EKGs according to my interpretation, her blood pressure is significantly elevated and will be treated with antihypertensives at this time. That being said she has minimal symptoms to suggest endorgan dysfunction. Will repeat blood pressure after medications given, she will need refills on her medications. She states that the only reason that she has not taken her medications because she has been out and does not have a refill prescription.  Blood pressure improved significantly throughout emergency department stay after being given medication, patient has no symptoms on discharge  Medical screening examination/treatment/procedure(s) were conducted as a shared visit with non-physician practitioner(s) and myself.  I personally evaluated the patient during the encounter.  Clinical Impression: Hypertension, medication noncompliance      Vida Roller, MD 01/14/13 [phone]

## 2013-01-10 NOTE — Progress Notes (Signed)
[facility] spoke with patient about her [facility] [employer]. Patient confirmed pcp was [facility]. Patient declined my offer to make her a follow-up apt with PCP stating that she had one on Thursday.

## 2013-01-14 NOTE — ED Provider Notes (Signed)
Medical screening examination/treatment/procedure(s) were conducted as a shared visit with non-physician practitioner(s) and myself.  I personally evaluated the patient during the encounter  Please see my separate respective documentation pertaining to this patient encounter   Vida Roller, MD 01/14/13 [phone]

## 2013-05-23 ENCOUNTER — Emergency Department (HOSPITAL_COMMUNITY): Payer: No Typology Code available for payment source

## 2013-05-23 ENCOUNTER — Encounter (HOSPITAL_COMMUNITY): Payer: Self-pay | Admitting: Emergency Medicine

## 2013-05-23 ENCOUNTER — Observation Stay (HOSPITAL_COMMUNITY)
Admission: EM | Admit: 2013-05-23 | Discharge: 2013-05-24 | Disposition: A | Discharge status: Home / Self Care | Payer: No Typology Code available for payment source | Attending: Family Medicine | Admitting: Family Medicine

## 2013-05-23 DIAGNOSIS — I160 Hypertensive urgency: Secondary | ICD-10-CM

## 2013-05-23 DIAGNOSIS — R0789 Other chest pain: Principal | ICD-10-CM | POA: Insufficient documentation

## 2013-05-23 DIAGNOSIS — B75 Trichinellosis: Secondary | ICD-10-CM

## 2013-05-23 DIAGNOSIS — F1010 Alcohol abuse, uncomplicated: Secondary | ICD-10-CM

## 2013-05-23 DIAGNOSIS — D509 Iron deficiency anemia, unspecified: Secondary | ICD-10-CM

## 2013-05-23 DIAGNOSIS — I252 Old myocardial infarction: Secondary | ICD-10-CM | POA: Insufficient documentation

## 2013-05-23 DIAGNOSIS — Z87891 Personal history of nicotine dependence: Secondary | ICD-10-CM | POA: Insufficient documentation

## 2013-05-23 DIAGNOSIS — R079 Chest pain, unspecified: Secondary | ICD-10-CM | POA: Diagnosis present

## 2013-05-23 DIAGNOSIS — I447 Left bundle-branch block, unspecified: Secondary | ICD-10-CM

## 2013-05-23 DIAGNOSIS — I10 Essential (primary) hypertension: Secondary | ICD-10-CM | POA: Insufficient documentation

## 2013-05-23 DIAGNOSIS — R42 Dizziness and giddiness: Secondary | ICD-10-CM | POA: Insufficient documentation

## 2013-05-23 DIAGNOSIS — Z9119 Patient's noncompliance with other medical treatment and regimen: Secondary | ICD-10-CM | POA: Insufficient documentation

## 2013-05-23 DIAGNOSIS — N739 Female pelvic inflammatory disease, unspecified: Secondary | ICD-10-CM

## 2013-05-23 DIAGNOSIS — Z79899 Other long term (current) drug therapy: Secondary | ICD-10-CM | POA: Insufficient documentation

## 2013-05-23 DIAGNOSIS — R0602 Shortness of breath: Secondary | ICD-10-CM | POA: Insufficient documentation

## 2013-05-23 DIAGNOSIS — E785 Hyperlipidemia, unspecified: Secondary | ICD-10-CM | POA: Insufficient documentation

## 2013-05-23 DIAGNOSIS — R51 Headache: Secondary | ICD-10-CM | POA: Insufficient documentation

## 2013-05-23 DIAGNOSIS — I471 Supraventricular tachycardia: Secondary | ICD-10-CM

## 2013-05-23 DIAGNOSIS — Z91199 Patient's noncompliance with other medical treatment and regimen due to unspecified reason: Secondary | ICD-10-CM | POA: Insufficient documentation

## 2013-05-23 DIAGNOSIS — Z7982 Long term (current) use of aspirin: Secondary | ICD-10-CM | POA: Insufficient documentation

## 2013-05-23 DIAGNOSIS — J45909 Unspecified asthma, uncomplicated: Secondary | ICD-10-CM

## 2013-05-23 LAB — CBC WITH DIFFERENTIAL/PLATELET
Basophils Absolute: 0 10*3/uL (ref 0.0–0.1)
Basophils Relative: 0 % (ref 0–1)
EOS ABS: 0.2 10*3/uL (ref 0.0–0.7)
EOS PCT: 2 % (ref 0–5)
HEMATOCRIT: 37.8 % (ref 36.0–46.0)
HEMOGLOBIN: 12.2 g/dL (ref 12.0–15.0)
LYMPHS ABS: 1.4 10*3/uL (ref 0.7–4.0)
Lymphocytes Relative: 18 % (ref 12–46)
MCH: 27.5 pg (ref 26.0–34.0)
MCHC: 32.3 g/dL (ref 30.0–36.0)
MCV: 85.3 fL (ref 78.0–100.0)
MONO ABS: 0.7 10*3/uL (ref 0.1–1.0)
MONOS PCT: 9 % (ref 3–12)
Neutro Abs: 5.7 10*3/uL (ref 1.7–7.7)
Neutrophils Relative %: 71 % (ref 43–77)
Platelets: 209 10*3/uL (ref 150–400)
RBC: 4.43 MIL/uL (ref 3.87–5.11)
RDW: 14.6 % (ref 11.5–15.5)
WBC: 8 10*3/uL (ref 4.0–10.5)

## 2013-05-23 LAB — COMPREHENSIVE METABOLIC PANEL
ALK PHOS: 59 U/L (ref 39–117)
ALT: 12 U/L (ref 0–35)
AST: 17 U/L (ref 0–37)
Albumin: 3.4 g/dL — ABNORMAL LOW (ref 3.5–5.2)
BUN: 19 mg/dL (ref 6–23)
CO2: 26 mEq/L (ref 19–32)
Calcium: 9.2 mg/dL (ref 8.4–10.5)
Chloride: 97 mEq/L (ref 96–112)
Creatinine, Ser: 0.84 mg/dL (ref 0.50–1.10)
GFR calc non Af Amer: 80 mL/min — ABNORMAL LOW (ref >90)
GLUCOSE: 112 mg/dL — AB (ref 70–99)
POTASSIUM: 3.4 meq/L — AB (ref 3.7–5.3)
Sodium: 135 mEq/L — ABNORMAL LOW (ref 137–147)
TOTAL PROTEIN: 6.9 g/dL (ref 6.0–8.3)
Total Bilirubin: 0.7 mg/dL (ref 0.3–1.2)

## 2013-05-23 LAB — T4, FREE: Free T4: 1.25 ng/dL (ref 0.80–1.80)

## 2013-05-23 LAB — PRO B NATRIURETIC PEPTIDE: PRO B NATRI PEPTIDE: 868.4 pg/mL — AB (ref 0–125)

## 2013-05-23 LAB — TROPONIN I
Troponin I: <0.3 ng/mL (ref <0.30)
Troponin I: <0.3 ng/mL (ref <0.30)
Troponin I: <0.3 ng/mL (ref <0.30)

## 2013-05-23 LAB — CBC
HCT: 36.4 % (ref 36.0–46.0)
Hemoglobin: 12.1 g/dL (ref 12.0–15.0)
MCH: 28.3 pg (ref 26.0–34.0)
MCHC: 33.2 g/dL (ref 30.0–36.0)
MCV: 85 fL (ref 78.0–100.0)
Platelets: 208 10*3/uL (ref 150–400)
RBC: 4.28 MIL/uL (ref 3.87–5.11)
RDW: 14.6 % (ref 11.5–15.5)
WBC: 8.1 10*3/uL (ref 4.0–10.5)

## 2013-05-23 LAB — TSH: TSH: 3.381 u[IU]/mL (ref 0.350–4.500)

## 2013-05-23 LAB — CREATININE, SERUM
CREATININE: 0.79 mg/dL (ref 0.50–1.10)
GFR calc Af Amer: >90 mL/min (ref >90)

## 2013-05-23 MED ORDER — MORPHINE SULFATE 2 MG/ML IJ SOLN: 2.0000 mg | INTRAMUSCULAR | Status: DC | PRN

## 2013-05-23 MED ORDER — ASPIRIN 81 MG PO CHEW
81.0000 mg | CHEWABLE_TABLET | Freq: Every morning | ORAL | Status: DC
  Administered 2013-05-24: 81 mg via ORAL
  Filled 2013-05-23 (×2): qty 1

## 2013-05-23 MED ORDER — LISINOPRIL 40 MG PO TABS
40.0000 mg | ORAL_TABLET | Freq: Every day | ORAL | Status: DC
  Administered 2013-05-23 – 2013-05-24 (×2): 40 mg via ORAL
  Filled 2013-05-23 (×2): qty 1

## 2013-05-23 MED ORDER — ISOSORBIDE MONONITRATE ER 60 MG PO TB24
60.0000 mg | ORAL_TABLET | Freq: Every morning | ORAL | Status: DC
  Administered 2013-05-23 – 2013-05-24 (×2): 60 mg via ORAL
  Filled 2013-05-23 (×2): qty 1

## 2013-05-23 MED ORDER — ASPIRIN 81 MG PO CHEW
324.0000 mg | CHEWABLE_TABLET | Freq: Once | ORAL | Status: AC
  Administered 2013-05-23: 324 mg via ORAL
  Filled 2013-05-23: qty 4

## 2013-05-23 MED ORDER — ATORVASTATIN CALCIUM 10 MG PO TABS
10.0000 mg | ORAL_TABLET | Freq: Every day | ORAL | Status: DC
  Administered 2013-05-23: 10 mg via ORAL
  Filled 2013-05-23 (×2): qty 1

## 2013-05-23 MED ORDER — NITROGLYCERIN 0.4 MG SL SUBL
0.4000 mg | SUBLINGUAL_TABLET | SUBLINGUAL | Status: DC | PRN
  Administered 2013-05-23 (×2): 0.4 mg via SUBLINGUAL
  Filled 2013-05-23: qty 1

## 2013-05-23 MED ORDER — ONDANSETRON HCL 4 MG/2ML IJ SOLN: 4.0000 mg | Freq: Once | INTRAMUSCULAR | Status: DC

## 2013-05-23 MED ORDER — GI COCKTAIL ~~LOC~~
30.0000 mL | Freq: Four times a day (QID) | ORAL | Status: DC | PRN
  Administered 2013-05-23: 30 mL via ORAL
  Filled 2013-05-23: qty 30

## 2013-05-23 MED ORDER — HYDRALAZINE HCL 25 MG PO TABS
25.0000 mg | ORAL_TABLET | Freq: Four times a day (QID) | ORAL | Status: DC | PRN
  Filled 2013-05-23: qty 1

## 2013-05-23 MED ORDER — ENOXAPARIN SODIUM 40 MG/0.4ML ~~LOC~~ SOLN
40.0000 mg | SUBCUTANEOUS | Status: DC
  Administered 2013-05-23 – 2013-05-24 (×2): 40 mg via SUBCUTANEOUS
  Filled 2013-05-23 (×2): qty 0.4

## 2013-05-23 MED ORDER — ONDANSETRON HCL 4 MG/2ML IJ SOLN
4.0000 mg | Freq: Once | INTRAMUSCULAR | Status: AC
  Administered 2013-05-23: 4 mg via INTRAVENOUS
  Filled 2013-05-23: qty 2

## 2013-05-23 MED ORDER — PNEUMOCOCCAL VAC POLYVALENT 25 MCG/0.5ML IJ INJ
0.5000 mL | INJECTION | INTRAMUSCULAR | Status: AC
  Administered 2013-05-24: 0.5 mL via INTRAMUSCULAR
  Filled 2013-05-23 (×2): qty 0.5

## 2013-05-23 MED ORDER — VERAPAMIL HCL ER 240 MG PO TBCR
240.0000 mg | EXTENDED_RELEASE_TABLET | Freq: Every day | ORAL | Status: DC
  Administered 2013-05-23: 240 mg via ORAL
  Filled 2013-05-23 (×2): qty 1

## 2013-05-23 MED ORDER — SODIUM CHLORIDE 0.9 % IV SOLN
INTRAVENOUS | Status: DC
  Administered 2013-05-23: 12:00:00 via INTRAVENOUS

## 2013-05-23 MED ORDER — LORAZEPAM 2 MG/ML IJ SOLN
1.0000 mg | Freq: Once | INTRAMUSCULAR | Status: AC
  Administered 2013-05-23: 1 mg via INTRAVENOUS
  Filled 2013-05-23: qty 1

## 2013-05-23 MED ORDER — SODIUM CHLORIDE 0.9 % IV BOLUS (SEPSIS): 500.0000 mL | Freq: Once | INTRAVENOUS | Status: DC

## 2013-05-23 MED ORDER — ONDANSETRON HCL 4 MG/2ML IJ SOLN: 4.0000 mg | Freq: Three times a day (TID) | INTRAMUSCULAR | Status: AC | PRN

## 2013-05-23 MED ORDER — MORPHINE SULFATE 4 MG/ML IJ SOLN: 4.0000 mg | Freq: Once | INTRAMUSCULAR | Status: DC

## 2013-05-23 MED ORDER — CARVEDILOL 25 MG PO TABS
25.0000 mg | ORAL_TABLET | Freq: Two times a day (BID) | ORAL | Status: DC
  Administered 2013-05-23 – 2013-05-24 (×3): 25 mg via ORAL
  Filled 2013-05-23 (×7): qty 1

## 2013-05-23 MED ORDER — MORPHINE SULFATE 4 MG/ML IJ SOLN
6.0000 mg | Freq: Once | INTRAMUSCULAR | Status: AC
  Administered 2013-05-23: 4 mg via INTRAVENOUS
  Filled 2013-05-23: qty 2

## 2013-05-23 MED ORDER — MORPHINE SULFATE 4 MG/ML IJ SOLN
4.0000 mg | INTRAMUSCULAR | Status: DC | PRN
  Administered 2013-05-24: 4 mg via INTRAVENOUS
  Filled 2013-05-23: qty 1

## 2013-05-23 NOTE — H&P (Signed)
PCP:   Default, Provider, MD   Chief Complaint:  Chest pain  HPI: 48 yr old female  who   has a past medical history of Anxiety; Hypertension; SVT (supraventricular tachycardia) (10/22/10); LBBB (left bundle branch block); Noncompliance; Hyperlipidemia; Asthma; Atypical chest pain; Coronary artery spasm; and Abnormal TSH. Today presented to the ED with chief come chest pain going on for past 2 weeks. Pain initially started on the right side and was sharp in character and after a few days move the left side which was also sharp in character, and later radiated to the middle of the chest which was felt like pressure. Patient says that the chest pressure is constant. She does have a history of coronary  Artery spasm, she had non-STEMI with cardiac cath in August 2013 which showed no CAD but did have coronary spasm on the first injection of left coronary artery. She denies nausea vomiting or diarrhea. Does admit to having some dizziness but no passing out. She also admits to having shortness of breath. EKG showed T-wave inversions in lead 23 aVF V4 V5 V6. Which has unchanged from the past. Blood pressure was elevated. Cardiac enzymes x1 are negative.   Allergies:   Allergies  Allergen Reactions  . Penicillins Hives  . Shellfish Allergy Other (See Comments)    "It makes my tongue swell and itch but I eat it anyway". She usually takes a Benadryl before eating it.      Past Medical History  Diagnosis Date  . Anxiety   . Hypertension   . SVT (supraventricular tachycardia) 10/22/10    wide complex tachycardia of same QRS as intinsic LBBB  . LBBB (left bundle branch block)   . Noncompliance   . Hyperlipidemia   . Asthma   . Atypical chest pain     recent low risk myoview  . Coronary artery spasm     a. NSTEMI 10/2011 with no CAD by cath but evidence of spasm when injecting left coronary artery.   . Abnormal TSH     10/2011 - additional hormones pending    Past Surgical History  Procedure  Laterality Date  . Tubal ligation      Prior to Admission medications   Medication Sig Start Date End Date Taking? Authorizing Provider  albuterol (PROVENTIL HFA;VENTOLIN HFA) 108 (90 BASE) MCG/ACT inhaler Inhale 2 puffs into the lungs every 6 (six) hours as needed. For wheezing   Yes Historical Provider, MD  aspirin 81 MG EC tablet Take 81 mg by mouth every morning.    Yes Historical Provider, MD  atorvastatin (LIPITOR) 10 MG tablet Take 10 mg by mouth at bedtime.   Yes Historical Provider, MD  carvedilol (COREG) 25 MG tablet Take 1 tablet (25 mg total) by mouth 2 (two) times daily with a meal. 11/21/11  Yes Dayna N Dunn, PA-C  ibuprofen (ADVIL,MOTRIN) 200 MG tablet Take 800 mg by mouth every 6 (six) hours as needed for headache.    Yes Historical Provider, MD  Iron 66 MG TABS Take 65 mg by mouth every morning.    Yes Historical Provider, MD  isosorbide mononitrate (IMDUR) 60 MG 24 hr tablet Take 60 mg by mouth every morning.   Yes Historical Provider, MD  lisinopril (PRINIVIL,ZESTRIL) 20 MG tablet Take 2 tablets (40 mg total) by mouth daily. 08/16/12  Yes Malvin Johns, MD  naphazoline-glycerin (CLEAR EYES) 0.012-0.2 % SOLN Place 1-2 drops into both eyes every 4 (four) hours as needed. For itchy eyes  Yes Historical Provider, MD  nitroGLYCERIN (NITROSTAT) 0.4 MG SL tablet Place 0.4 mg under the tongue every 5 (five) minutes as needed for chest pain.   Yes Historical Provider, MD  verapamil (COVERA HS) 240 MG (CO) 24 hr tablet Take 240 mg by mouth at bedtime.    Yes Historical Provider, MD    Social History:  reports that she quit smoking about 16 years ago. She has never used smokeless tobacco. She reports that she drinks about 3.0 ounces of alcohol per week. She reports that she does not use illicit drugs.  Family History  Problem Relation Age of Onset  . Stroke Mother      All the positives are listed in BOLD  Review of Systems:  HEENT: Headache, blurred vision, runny nose, sore  throat Neck: Hypothyroidism, hyperthyroidism,,lymphadenopathy Chest : Shortness of breath, history of COPD, Asthma Heart : Chest pain, history of coronary arterey disease GI:  Nausea, vomiting, diarrhea, constipation, GERD GU: Dysuria, urgency, frequency of urination, hematuria Neuro: Stroke, seizures, syncope Psych: Depression, anxiety, hallucinations   Physical Exam: Blood pressure 134/101, pulse 78, temperature 98 F (36.7 C), temperature source Oral, resp. rate 18, last menstrual period 05/16/2013, SpO2 100.00%. Constitutional:   Patient is a well-developed and well-nourished female* in no acute distress and cooperative with exam. Head: Normocephalic and atraumatic Mouth: Mucus membranes moist Eyes: PERRL, EOMI, conjunctivae normal Neck: Supple, No Thyromegaly Cardiovascular: RRR, S1 normal, S2 normal Pulmonary/Chest: CTAB, no wheezes, rales, or rhonchi Abdominal: Soft. Non-tender, non-distended, bowel sounds are normal, no masses, organomegaly, or guarding present.  Neurological: A&O x3, Strenght is normal and symmetric bilaterally, cranial nerve II-XII are grossly intact, no focal motor deficit, sensory intact to light touch bilaterally.  Extremities : No Cyanosis, Clubbing or Edema   Labs on Admission:  Results for orders placed during the hospital encounter of 05/23/13 (from the past 48 hour(s))  CBC WITH DIFFERENTIAL     Status: None   Collection Time    05/23/13  8:00 AM      Result Value Ref Range   WBC 8.0  4.0 - 10.5 K/uL   RBC 4.43  3.87 - 5.11 MIL/uL   Hemoglobin 12.2  12.0 - 15.0 g/dL   HCT 37.8  36.0 - 46.0 %   MCV 85.3  78.0 - 100.0 fL   MCH 27.5  26.0 - 34.0 pg   MCHC 32.3  30.0 - 36.0 g/dL   RDW 14.6  11.5 - 15.5 %   Platelets 209  150 - 400 K/uL   Neutrophils Relative % 71  43 - 77 %   Neutro Abs 5.7  1.7 - 7.7 K/uL   Lymphocytes Relative 18  12 - 46 %   Lymphs Abs 1.4  0.7 - 4.0 K/uL   Monocytes Relative 9  3 - 12 %   Monocytes Absolute 0.7  0.1 -  1.0 K/uL   Eosinophils Relative 2  0 - 5 %   Eosinophils Absolute 0.2  0.0 - 0.7 K/uL   Basophils Relative 0  0 - 1 %   Basophils Absolute 0.0  0.0 - 0.1 K/uL  COMPREHENSIVE METABOLIC PANEL     Status: Abnormal   Collection Time    05/23/13  8:00 AM      Result Value Ref Range   Sodium 135 (*) 137 - 147 mEq/L   Potassium 3.4 (*) 3.7 - 5.3 mEq/L   Chloride 97  96 - 112 mEq/L   CO2 26  19 -  32 mEq/L   Glucose, Bld 112 (*) 70 - 99 mg/dL   BUN 19  6 - 23 mg/dL   Creatinine, Ser 0.84  0.50 - 1.10 mg/dL   Calcium 9.2  8.4 - 10.5 mg/dL   Total Protein 6.9  6.0 - 8.3 g/dL   Albumin 3.4 (*) 3.5 - 5.2 g/dL   AST 17  0 - 37 U/L   ALT 12  0 - 35 U/L   Alkaline Phosphatase 59  39 - 117 U/L   Total Bilirubin 0.7  0.3 - 1.2 mg/dL   GFR calc non Af Amer 80 (*) >90 mL/min   GFR calc Af Amer >90  >90 mL/min   Comment: (NOTE)     The eGFR has been calculated using the CKD EPI equation.     This calculation has not been validated in all clinical situations.     eGFR's persistently <90 mL/min signify possible Chronic Kidney     Disease.  PRO B NATRIURETIC PEPTIDE     Status: Abnormal   Collection Time    05/23/13  8:00 AM      Result Value Ref Range   Pro B Natriuretic peptide (BNP) 868.4 (*) 0 - 125 pg/mL  TROPONIN I     Status: None   Collection Time    05/23/13  8:00 AM      Result Value Ref Range   Troponin I <0.30  <0.30 ng/mL   Comment:            Due to the release kinetics of cTnI,     a negative result within the first hours     of the onset of symptoms does not rule out     myocardial infarction with certainty.     If myocardial infarction is still suspected,     repeat the test at appropriate intervals.    Radiological Exams on Admission: Dg Chest 2 View  05/23/2013   CLINICAL DATA:  Shortness of breath and chest pain  EXAM: CHEST  2 VIEW  COMPARISON:  June 05, 2012  FINDINGS: Lungs are clear. Heart size and pulmonary vascularity are normal. No adenopathy. No pneumothorax. No  bone lesions.  IMPRESSION: No abnormality noted.   Electronically Signed   By: Lowella Grip M.D.   On: 05/23/2013 08:36    Assessment/Plan Active Problems:   Chest pain Hypertensive urgency  Chest pain Sounds very atypical, but does have a history of non-STEMI with coronary vasospasm. We'll admit her to observation and monitor cardiac enzymes x3.  Hypertensive urgency Patient has been compliant with taking her medications. Will continue the home medications and also start hydralazine 25 mg by mouth every 6 hours when necessary for BP reck that in 160/100.  DVT prophylaxis Lovenox   Code status: Full code  Family discussion: No family at bedside   Time Spent on Admission: 53 minutes  [facility] Pager: [phone] 05/23/2013, 11:36 AM  If 7PM-7AM, please contact night-coverage  [email or web address]  Password [email or web address]

## 2013-05-23 NOTE — ED Provider Notes (Signed)
CSN: [id number]     Arrival date & time 05/23/13  0716 History   First MD Initiated Contact with Patient 05/23/13 [phone]     Chief Complaint  Patient presents with  . Chest Pain  . light headed   . Shortness of Breath  . Headache     (Consider location/radiation/quality/duration/timing/severity/associated sxs/prior Treatment) HPI [name] is a 48 y.o. female presents to emergency department complaining of chest pain. Patient states she initially developed chest pain 2 weeks ago, states took Rolaids and it went away. Patient again developed chest pain a week ago, since then and has been intermittent, nonexertional, changes from right chest to the left, sharp. States yesterday the pain changed, and became more pressure-like. Patient states this pressure is in the left side, and has been constant since yesterday. It is not relieved with any antacids or nitroglycerin. Patient does have history of SVT and hypertension. She did not take her medications this morning. She states today when she woke up with pressure and again, she came straight to emergency department. She does admit to shortness of breath, which is worse with laying down flat. She denies any swelling in her legs her extremities. There is no recent travel or surgeries. She denies any pleuritic components to her pain. She denies any cough.   Past Medical History  Diagnosis Date  . Anxiety   . Hypertension   . SVT (supraventricular tachycardia) 10/22/10    wide complex tachycardia of same QRS as intinsic LBBB  . LBBB (left bundle branch block)   . Noncompliance   . Hyperlipidemia   . Asthma   . Atypical chest pain     recent low risk myoview  . Coronary artery spasm     a. NSTEMI 10/2011 with no CAD by cath but evidence of spasm when injecting left coronary artery.   . Abnormal TSH     10/2011 - additional hormones pending   Past Surgical History  Procedure Laterality Date  . Tubal ligation     Family History  Problem  Relation Age of Onset  . Stroke Mother    History  Substance Use Topics  . Smoking status: Former Smoker    Quit date: 11/06/1996  . Smokeless tobacco: Never Used  . Alcohol Use: 3.0 oz/week    5 Cans of beer per week     Comment: heavy ETOH (1 pint of liquor most days) until last visit.  states that she no longer drinks ETOH   OB History   Grav Para Term Preterm Abortions TAB SAB Ect Mult Living                 Review of Systems  Constitutional: Negative for fever and chills.  Respiratory: Positive for chest tightness and shortness of breath. Negative for cough.   Cardiovascular: Positive for chest pain. Negative for palpitations and leg swelling.  Gastrointestinal: Negative for nausea, vomiting, abdominal pain and diarrhea.  Musculoskeletal: Negative for arthralgias and myalgias.  Skin: Negative for rash.  Neurological: Negative for dizziness, weakness and headaches.  All other systems reviewed and are negative.      Allergies  Penicillins and Shellfish allergy  Home Medications   Current Outpatient Rx  Name  Route  Sig  Dispense  Refill  . albuterol (PROVENTIL HFA;VENTOLIN HFA) 108 (90 BASE) MCG/ACT inhaler   Inhalation   Inhale 2 puffs into the lungs every 6 (six) hours as needed. For wheezing         .  aspirin 81 MG EC tablet   Oral   Take 81 mg by mouth every morning.          [name] atorvastatin (LIPITOR) 10 MG tablet   Oral   Take 10 mg by mouth at bedtime.         . carvedilol (COREG) 25 MG tablet   Oral   Take 1 tablet (25 mg total) by mouth 2 (two) times daily with a meal.   60 tablet   6   . ibuprofen (ADVIL,MOTRIN) 200 MG tablet   Oral   Take 800 mg by mouth every 6 (six) hours as needed for headache.          . Iron 66 MG TABS   Oral   Take 65 mg by mouth every morning.          . isosorbide mononitrate (IMDUR) 60 MG 24 hr tablet   Oral   Take 60 mg by mouth every morning.         [name] lisinopril (PRINIVIL,ZESTRIL) 20 MG tablet    Oral   Take 2 tablets (40 mg total) by mouth daily.   30 tablet   0   . lisinopril (PRINIVIL,ZESTRIL) 20 MG tablet   Oral   Take 2 tablets (40 mg total) by mouth daily.   20 tablet   0   . naphazoline-glycerin (CLEAR EYES) 0.012-0.2 % SOLN   Both Eyes   Place 1-2 drops into both eyes every 4 (four) hours as needed. For itchy eyes         . nitroGLYCERIN (NITROSTAT) 0.4 MG SL tablet   Sublingual   Place 0.4 mg under the tongue every 5 (five) minutes as needed for chest pain.         . verapamil (COVERA HS) 240 MG (CO) 24 hr tablet   Oral   Take 240 mg by mouth at bedtime.           BP 176/101  Pulse 93  Temp(Src) 98 F (36.7 C) (Oral)  Resp 16  SpO2 96% Physical Exam  Nursing note and vitals reviewed. Constitutional: She appears well-developed and well-nourished. No distress.  HENT:  Head: Normocephalic.  Eyes: Conjunctivae are normal.  Neck: Neck supple.  Cardiovascular: Normal rate, regular rhythm and normal heart sounds.   Pulmonary/Chest: Effort normal and breath sounds normal. No respiratory distress. She has no wheezes. She has no rales. She exhibits no tenderness.  Abdominal: Soft. Bowel sounds are normal. She exhibits no distension. There is no tenderness. There is no rebound.  Musculoskeletal: She exhibits no edema.  Neurological: She is alert.  Skin: Skin is warm and dry.  Psychiatric: She has a normal mood and affect. Her behavior is normal.    ED Course  Procedures (including critical care time) Labs Review Labs Reviewed  COMPREHENSIVE METABOLIC PANEL - Abnormal; Notable for the following:    Sodium 135 (*)    Potassium 3.4 (*)    Glucose, Bld 112 (*)    Albumin 3.4 (*)    GFR calc non Af Amer 80 (*)    All other components within normal limits  PRO B NATRIURETIC PEPTIDE - Abnormal; Notable for the following:    Pro B Natriuretic peptide (BNP) 868.4 (*)    All other components within normal limits  CBC WITH DIFFERENTIAL  TROPONIN I    Imaging Review Dg Chest 2 View  05/23/2013   CLINICAL DATA:  Shortness of breath and chest pain  EXAM: CHEST  2 VIEW  COMPARISON:  June 05, 2012  FINDINGS: Lungs are clear. Heart size and pulmonary vascularity are normal. No adenopathy. No pneumothorax. No bone lesions.  IMPRESSION: No abnormality noted.   Electronically Signed   By: Bretta Bang M.D.   On: 05/23/2013 08:36     EKG Interpretation None      Date: 05/23/2013  Rate: 83  Rhythm: normal sinus rhythm  QRS Axis: normal  Intervals: normal  ST/T Wave abnormalities: T wave inversion inferiorly and laterally  Conduction Disutrbances:none  Narrative Interpretation:   Old EKG Reviewed: changes noted T waves now upright compared to prior ECG in leads V1-V3   MDM   Final diagnoses:  Chest pain  Hypertensive urgency    Patient in emergency department complaining of chest pressure, shortness of breath. History of non-STEMI in 2013, clean cath which showed coronary vasospasms. Patient currently is on verapamil and nitroglycerin. She states she took nitroglycerin yesterday which did not help her pressure. Aspirin ordered and nitroglycerin sublingual in emergency department. Patient is also hypertensive blood pressure 170/101.   10:39 AM Pt's htn treated with sl niro, and regular medications. Her cp did not improve with SL nitro. Morphine and ativan ordered. BP went up as high as 180s/120s. BP down to 140/100. Will continue to monitor. She is still having chest pressure. Will admit for r/o.   Spoke with dr. Sharl Ma with [facility] will admit.    Filed Vitals:   05/23/13 0845 05/23/13 0945 05/23/13 1012 05/23/13 1013  BP: 180/113 173/124 140/102 140/102  Pulse: 86 70    Temp:      TempSrc:      Resp: 15 10    SpO2: 97% 99%       Lottie Mussel, PA-C 05/23/13 1643  Riaan Toledo A Jovanni Eckhart, PA-C 05/23/13 1644

## 2013-05-23 NOTE — ED Notes (Signed)
Patient transported to X-ray 

## 2013-05-23 NOTE — ED Notes (Addendum)
Pt reports cardiac hx. Reports chest pain started on right side, now on left side of chest x1 week. Pain at present 2/10. SOB,headache and light headness x2 days.   Pt reports hx of HTN, took meds yesterday, but cannot get next rx till Friday.

## 2013-05-23 NOTE — Progress Notes (Signed)
Pt arrived from ED on stretcher. Amb self to bed w/ steady gait. ORiented to callbell and environment. POC discussed. Tele 35 confirmed w/ CCMD. VSS. Denies cp/sob. No c/o at present.

## 2013-05-23 NOTE — ED Notes (Signed)
Report called to 4th floor Ripple[place]ret[place], Charity fundraiserN.

## 2013-05-23 NOTE — Progress Notes (Signed)
ED RN involved w/ caring for another pt at this time, unable to give report. Contact number left with [employer] operational officersecretary. Await callback.

## 2013-05-23 NOTE — Progress Notes (Signed)
[facility] CL provided pt with a [employer] Card application. Patient orange card had expired. Patient stated that she was in the process of renewing her Orange Card with [facility]. Patient stated that she knew she had an apt on Friday but could not remember what time that apt was. CL contacted PCP and received patient apt information 3/6 at 10:15 am. Provided pt with apt information.

## 2013-05-24 DIAGNOSIS — R0789 Other chest pain: Secondary | ICD-10-CM

## 2013-05-24 LAB — TROPONIN I: Troponin I: <0.3 ng/mL (ref <0.30)

## 2013-05-24 MED ORDER — HYDROCODONE-ACETAMINOPHEN 5-300 MG PO TABS: 1.0000 | ORAL_TABLET | Freq: Three times a day (TID) | ORAL | Status: DC | PRN

## 2013-05-24 NOTE — Progress Notes (Signed)
UR completed 

## 2013-05-24 NOTE — Consult Note (Signed)
CONSULT NOTE  Date: 05/24/2013               Patient Name:  [name] MRN: [id number]  DOB: 1965-09-24 Age / Sex: 48 y.o., female        PCP: Default, Provider Primary Cardiologist: Jens Som            Referring Physician: [place]              Reason for Consult: Chest pain            History of Present Illness: Patient is a 48 y.o. female with a PMHx of SVT, anxiety, hypertension, left bundle branch block, medical noncompliance, hyperlipidemia, atypical chest pain, coronary artery spasm , who was admitted to [facility] on 05/23/2013 for evaluation of chest pain.  The patient was admitted to the hospital in August, 2013  With chest pain. She had a non-ST segment elevation myocardial infarction. At that time, she was found to have normal coronary arteries. She was noted to have diffuse coronary spasm that responded to intracoronary nitroglycerin.  She presents now with 2 weeks of musculoskeletal chest pain. The chest pain as sharp and lasts for only a split second. It's definitely related to twisting and turning of her torso. Occasionally will increase when she takes a deep breath but she doesn't describe pleuritic chest pain exactly. The chest pain does not appear to be exertional but clearly is related to twisting her chest and moving around.  The chest pain is not at all similar to her presenting episodes of pain in 2013 to   Medications: Outpatient medications: Prescriptions prior to admission  Medication Sig Dispense Refill  . albuterol (PROVENTIL HFA;VENTOLIN HFA) 108 (90 BASE) MCG/ACT inhaler Inhale 2 puffs into the lungs every 6 (six) hours as needed. For wheezing      . aspirin 81 MG EC tablet Take 81 mg by mouth every morning.       [name] atorvastatin (LIPITOR) 10 MG tablet Take 10 mg by mouth at bedtime.      . carvedilol (COREG) 25 MG tablet Take 1 tablet (25 mg total) by mouth 2 (two) times daily with a meal.  60 tablet  6  . ibuprofen (ADVIL,MOTRIN) 200 MG tablet Take  800 mg by mouth every 6 (six) hours as needed for headache.       . Iron 66 MG TABS Take 65 mg by mouth every morning.       . isosorbide mononitrate (IMDUR) 60 MG 24 hr tablet Take 60 mg by mouth every morning.      [name] lisinopril (PRINIVIL,ZESTRIL) 20 MG tablet Take 2 tablets (40 mg total) by mouth daily.  30 tablet  0  . naphazoline-glycerin (CLEAR EYES) 0.012-0.2 % SOLN Place 1-2 drops into both eyes every 4 (four) hours as needed. For itchy eyes      . nitroGLYCERIN (NITROSTAT) 0.4 MG SL tablet Place 0.4 mg under the tongue every 5 (five) minutes as needed for chest pain.      . verapamil (COVERA HS) 240 MG (CO) 24 hr tablet Take 240 mg by mouth at bedtime.         Current medications: Current Facility-Administered Medications  Medication Dose Route Frequency Provider Last Rate Last Dose  . 0.9 %  sodium chloride infusion   Intravenous Continuous Meredeth Ide, MD 20 mL/hr at 05/23/13 1158    . aspirin chewable tablet 81 mg  81 mg Oral q morning - 10a  Meredeth Ide, MD   81 mg at 05/24/13 8295  . atorvastatin (LIPITOR) tablet 10 mg  10 mg Oral QHS Meredeth Ide, MD   10 mg at 05/23/13 2136  . carvedilol (COREG) tablet 25 mg  25 mg Oral BID WC Tatyana A Kirichenko, PA-C   25 mg at 05/24/13 0901  . enoxaparin (LOVENOX) injection 40 mg  40 mg Subcutaneous Q24H Meredeth Ide, MD   40 mg at 05/24/13 1320  . gi cocktail (Maalox,Lidocaine,Donnatal)  30 mL Oral QID PRN Meredeth Ide, MD   30 mL at 05/23/13 2002  . hydrALAZINE (APRESOLINE) tablet 25 mg  25 mg Oral Q6H PRN Meredeth Ide, MD      . isosorbide mononitrate (IMDUR) 24 hr tablet 60 mg  60 mg Oral q morning - 10a Meredeth Ide, MD   60 mg at 05/24/13 0904  . lisinopril (PRINIVIL,ZESTRIL) tablet 40 mg  40 mg Oral Daily Tatyana A Kirichenko, PA-C   40 mg at 05/24/13 0904  . morphine 4 MG/ML injection 4 mg  4 mg Intravenous Q2H PRN Tatyana A Kirichenko, PA-C   4 mg at 05/24/13 6213  . nitroGLYCERIN (NITROSTAT) SL tablet 0.4 mg  0.4 mg Sublingual Q5  min PRN Tatyana A Kirichenko, PA-C   0.4 mg at 05/23/13 0842  . verapamil (CALAN-SR) CR tablet 240 mg  240 mg Oral QHS Meredeth Ide, MD   240 mg at 05/23/13 2136     Allergies  Allergen Reactions  . Penicillins Hives  . Shellfish Allergy Other (See Comments)    "It makes my tongue swell and itch but I eat it anyway". She usually takes a Benadryl before eating it.     Past Medical History  Diagnosis Date  . Anxiety   . Hypertension   . SVT (supraventricular tachycardia) 10/22/10    wide complex tachycardia of same QRS as intinsic LBBB  . LBBB (left bundle branch block)   . Noncompliance   . Hyperlipidemia   . Asthma   . Atypical chest pain     recent low risk myoview  . Coronary artery spasm     a. NSTEMI 10/2011 with no CAD by cath but evidence of spasm when injecting left coronary artery.   . Abnormal TSH     10/2011 - additional hormones pending    Past Surgical History  Procedure Laterality Date  . Tubal ligation      Family History  Problem Relation Age of Onset  . Stroke Mother     Social History:  reports that she quit smoking about 16 years ago. She has never used smokeless tobacco. She reports that she drinks about 3.0 ounces of alcohol per week. She reports that she does not use illicit drugs.   Review of Systems: Constitutional:  denies fever, chills, diaphoresis, appetite change and fatigue.  HEENT: denies photophobia, eye pain, redness, hearing loss, ear pain, congestion, sore throat, rhinorrhea, sneezing, neck pain, neck stiffness and tinnitus.  Respiratory: denies SOB, DOE, cough, chest tightness, and wheezing.  Cardiovascular: admits to chest wall pain  Gastrointestinal: denies nausea, vomiting, abdominal pain, diarrhea, constipation, blood in stool.  Genitourinary: denies dysuria, urgency, frequency, hematuria, flank pain and difficulty urinating.  Musculoskeletal: denies  myalgias, back pain, joint swelling, arthralgias and gait problem.   Skin: denies  pallor, rash and wound.  Neurological: denies dizziness, seizures, syncope, weakness, light-headedness, numbness and headaches.   Hematological: denies adenopathy, easy bruising, personal or family bleeding history.  Psychiatric/ Behavioral: denies suicidal ideation, mood changes, confusion, nervousness, sleep disturbance and agitation.    Physical Exam: BP 124/96  Pulse 68  Temp(Src) 98.4 F (36.9 C) (Oral)  Resp 18  Ht 5\' 1"  (1.549 m)  Wt 189 lb 9.5 oz (86 kg)  BMI 35.84 kg/m2  SpO2 100%  LMP 05/16/2013  Wt Readings from Last 3 Encounters:  05/23/13 189 lb 9.5 oz (86 kg)  08/16/12 200 lb (90.719 kg)  11/21/11 198 lb 10.2 oz (90.1 kg)    General: Vital signs reviewed and noted. Well-developed, well-nourished, in no acute distress; alert,  Head: Normocephalic, atraumatic, sclera anicteric,   Neck: Supple. Negative for carotid bruits. No JVD   Lungs:  Clear bilaterally, no  wheezes, rales, or rhonchi. Breathing is normal   Heart: RRR with S1 S2. No murmurs, rubs, or gallops .  She has chest wall tenderness above her left breast  Abdomen:  Soft, non-tender, non-distended with normoactive bowel sounds. No hepatomegaly. No rebound/guarding. No obvious abdominal masses   MSK: Strength and the appear normal for age.   Extremities: No clubbing or cyanosis. No edema.  Distal pedal pulses are 2+ and equal   Neurologic: Alert and oriented X 3. Moves all extremities spontaneously.  Psych: Responds to questions appropriately with a normal affect.     Lab results: Basic Metabolic Panel:  Recent Labs Lab 05/23/13 0800 05/23/13 1300  NA 135*  --   K 3.4*  --   CL 97  --   CO2 26  --   GLUCOSE 112*  --   BUN 19  --   CREATININE 0.84 0.79  CALCIUM 9.2  --     Liver Function Tests:  Recent Labs Lab 05/23/13 0800  AST 17  ALT 12  ALKPHOS 59  BILITOT 0.7  PROT 6.9  ALBUMIN 3.4*   No results found for this basename: LIPASE, AMYLASE,  in the last 168 hours No results found  for this basename: AMMONIA,  in the last 168 hours  CBC:  Recent Labs Lab 05/23/13 0800 05/23/13 1300  WBC 8.0 8.1  NEUTROABS 5.7  --   HGB 12.2 12.1  HCT 37.8 36.4  MCV 85.3 85.0  PLT 209 208    Cardiac Enzymes:  Recent Labs Lab 05/23/13 0800 05/23/13 1300 05/23/13 1808 05/23/13 2345  TROPONINI <0.30 <0.30 <0.30 <0.30    BNP: No components found with this basename: POCBNP,   CBG: No results found for this basename: GLUCAP,  in the last 168 hours  Coagulation Studies: No results found for this basename: LABPROT, INR,  in the last 72 hours   Other results: EKG :  NSR, LVH with repol   Imaging: Dg Chest 2 View  05/23/2013   CLINICAL DATA:  Shortness of breath and chest pain  EXAM: CHEST  2 VIEW  COMPARISON:  June 05, 2012  FINDINGS: Lungs are clear. Heart size and pulmonary vascularity are normal. No adenopathy. No pneumothorax. No bone lesions.  IMPRESSION: No abnormality noted.   Electronically Signed   By: Bretta Bang M.D.   On: 05/23/2013 08:36       Assessment & Plan:  1. Chest pain: The patient has a history of coronary spasm in the past but these pains are definitely different. These pains are definitely related to movement of the chest wall. In fact, she is tender above her left breast. She is ruled out for myocardial infarction. Her EKG is unchanged.    At this point I  do not think that she needs any further evaluation. She has smooth normal coronary arteries at baseline. She does have some coronary spasm this would be associated with EKG abnormalities and/ or troponin elevation.    2. history of supraventricular tachycardia:  She's been followed by Dr. Johney Frame. I've advised her to followup with Dr. Johney Frame if she has any further episodes of SVT. There is no further workup needed.  We'll sign off. Please call further questions.  She will follow up with her medical doctor.      Vesta Mixer, Montez Hageman., MD, [facility] 05/24/2013, 1:41 PM Office -  [phone] Pager [phone]

## 2013-05-24 NOTE — Discharge Instructions (Addendum)
Chest Pain (Nonspecific) Chest pain has many causes. Your pain could be caused by something serious, such as a heart attack or a blood clot in the lungs. It could also be caused by something less serious, such as a chest bruise or a virus. Follow up with your doctor. More lab tests or other studies may be needed to find the cause of your pain. Most of the time, nonspecific chest pain will improve within 2 to 3 days of rest and mild pain medicine. HOME CARE  For chest bruises, you may put ice on the sore area for 15-20 minutes, 03-04 times a day. Do this only if it makes you feel better.  Put ice in a plastic bag.  Place a towel between the skin and the bag.  Rest for the next 2 to 3 days.  Go back to work if the pain improves.  See your doctor if the pain lasts longer than 1 to 2 weeks.  Only take medicine as told by your doctor.  Quit smoking if you smoke. GET HELP RIGHT AWAY IF:   There is more pain or pain that spreads to the arm, neck, jaw, back, or belly (abdomen).  You have shortness of breath.  You cough more than usual or cough up blood.  You have very bad back or belly pain, feel sick to your stomach (nauseous), or throw up (vomit).  You have very bad weakness.  You pass out (faint).  You have a fever. Any of these problems may be serious and may be an emergency. Do not wait to see if the problems will go away. Get medical help right away. Call your local emergency services 911 in [place] Do not drive yourself to the hospital. MAKE SURE YOU:   Understand these instructions.  Will watch this condition.  Will get help right away if you or your child is not doing well or gets worse. Document Released: 08/27/2007 Document Revised: 06/02/2011 Document Reviewed: 08/27/2007 [facility] Patient Information 2014 [place], [place].  Chest Pain Observation It is often hard to give a specific diagnosis for the cause of chest pain. Among other possibilities your symptoms might be  caused by inadequate oxygen delivery to your heart (angina). Angina that is not treated or evaluated can lead to a heart attack (myocardial infarction) or death. Blood tests, electrocardiograms, and X-rays may have been done to help determine a possible cause of your chest pain. After evaluation and observation, your health care provider has determined that it is unlikely your pain was caused by an unstable condition that requires hospitalization. However, a full evaluation of your pain may need to be completed, with additional diagnostic testing as directed. It is very important to keep your follow-up appointments. Not keeping your follow-up appointments could result in permanent heart damage, disability, or death. If there is any problem keeping your follow-up appointments, you must call your health care provider. HOME CARE INSTRUCTIONS  Due to the slight chance that your pain could be angina, it is important to follow your health care provider's treatment plan and also maintain a healthy lifestyle:  Maintain or work toward achieving a healthy weight.  Stay physically active and exercise regularly.  Decrease your salt intake.  Eat a balanced, healthy diet. Talk to a dietician to learn about heart healthy foods.  Increase your fiber intake by including whole grains, vegetables, fruits, and nuts in your diet.  Avoid situations that cause stress, anger, or depression.  Take medicines as advised by your health  care provider. Report any side effects to your health care provider. Do not stop medicines or adjust the dosages on your own. °· Quit smoking. Do not use nicotine patches or gum until you check with your health care provider. °· Keep your blood pressure, blood sugar, and cholesterol levels within normal limits. °· Limit alcohol intake to no more than 1 drink per day for women that are not pregnant and 2 drinks per day for men. °· Do not abuse drugs. °SEEK IMMEDIATE MEDICAL CARE IF: °You have  severe chest pain or pressure which may include symptoms such as: °· You feel pain or pressure in you arms, neck, jaw, or back. °· You have severe back or abdominal pain, feel sick to your stomach (nauseous), or throw up (vomit). °· You are sweating profusely. °· You are having a fast or irregular heartbeat. °· You feel short of breath while at rest. °· You notice increasing shortness of breath during rest, sleep, or with activity. °· You have chest pain that does not get better after rest or after taking your usual medicine. °· You wake from sleep with chest pain. °· You are unable to sleep because you cannot breathe. °· You develop a frequent cough or you are coughing up blood. °· You feel dizzy, faint, or experience extreme fatigue. °· You develop severe weakness, dizziness, fainting, or chills. °Any of these symptoms may represent a serious problem that is an emergency. Do not wait to see if the symptoms will go away. Call your local emergency services (911 in the [place] Do not drive yourself to the hospital. °MAKE SURE YOU: °· Understand these instructions. °· Will watch your condition. °· Will get help right away if you are not doing well or get worse. °Document Released: 04/12/2010 Document Revised: 11/10/2012 Document Reviewed: 09/09/2012 °[employer]® Patient Information ©2014 [employer]. ° °

## 2013-05-24 NOTE — Progress Notes (Signed)
D/C instructions reviewed w/ pt. Pt verbalized understanding and all questions answered. Pt d/c in w/c in stable condition by NT to family's car. Pt in possession of d/c instructions, script, and all personal belongings.  

## 2013-05-24 NOTE — Discharge Summary (Signed)
Physician Discharge Summary  [name] UVO:[id number] DOB: 05-Apr-1965 DOA: 05/23/2013  PCP: Default, Provider, MD  Admit date: 05/23/2013 Discharge date: 05/24/2013  Time spent: 50* minutes  Recommendations for Outpatient Follow-up:  1. *Follow up PCP in 2 weeks  Discharge Diagnoses:  Active Problems:   Chest pain   Discharge Condition: Stable  Diet recommendation: Low salt diet  Filed Weights   05/23/13 1133  Weight: 86 kg (189 lb 9.5 oz)    History of present illness:  48 yr old female who has a past medical history of Anxiety; Hypertension; SVT (supraventricular tachycardia) (10/22/10); LBBB (left bundle branch block); Noncompliance; Hyperlipidemia; Asthma; Atypical chest pain; Coronary artery spasm; and Abnormal TSH.  Today presented to the ED with chief come chest pain going on for past 2 weeks. Pain initially started on the right side and was sharp in character and after a few days move the left side which was also sharp in character, and later radiated to the middle of the chest which was felt like pressure. Patient says that the chest pressure is constant. She does have a history of coronary Artery spasm, she had non-STEMI with cardiac cath in August 2013 which showed no CAD but did have coronary spasm on the first injection of left coronary artery.  She denies nausea vomiting or diarrhea. Does admit to having some dizziness but no passing out. She also admits to having shortness of breath. EKG showed T-wave inversions in lead 23 aVF V4 V5 V6. Which has unchanged from the past. Blood pressure was elevated. Cardiac enzymes x1 are negative   Hospital Course:  Chest pain  Patient was admitted for chest pain, she has h/o coronary vasospasm. Cardiac enzymes x3 are negative, cardiology was consulted. No further interventions per cardiology. Noncardiac pain, musculoskeletal. Will discharge her home on vicodin.   Hypertensive urgency  Resolved, will continue her home  medications.     Procedures:  None  Consultations:  Cardiology   Discharge Exam: Filed Vitals:   05/24/13 0900  BP: 124/96  Pulse: 68  Temp:   Resp: 18    General: Appear in no acute distress Cardiovascular: s1s2 RRR Respiratory: Clear bilaterally  Discharge Instructions  Discharge Orders   Future Orders Complete By Expires   Diet - low sodium heart healthy  As directed    Increase activity slowly  As directed        Medication List    STOP taking these medications       ibuprofen 200 MG tablet  Commonly known as:  ADVIL,MOTRIN      TAKE these medications       albuterol 108 (90 BASE) MCG/ACT inhaler  Commonly known as:  PROVENTIL HFA;VENTOLIN HFA  Inhale 2 puffs into the lungs every 6 (six) hours as needed. For wheezing     aspirin 81 MG EC tablet  Take 81 mg by mouth every morning.     atorvastatin 10 MG tablet  Commonly known as:  LIPITOR  Take 10 mg by mouth at bedtime.     carvedilol 25 MG tablet  Commonly known as:  COREG  Take 1 tablet (25 mg total) by mouth 2 (two) times daily with a meal.     Hydrocodone-Acetaminophen 5-300 MG Tabs  Commonly known as:  VICODIN  Take 1 tablet by mouth every 8 (eight) hours as needed.     Iron 66 MG Tabs  Take 65 mg by mouth every morning.     isosorbide mononitrate 60 MG  24 hr tablet  Commonly known as:  IMDUR  Take 60 mg by mouth every morning.     lisinopril 20 MG tablet  Commonly known as:  PRINIVIL,ZESTRIL  Take 2 tablets (40 mg total) by mouth daily.     naphazoline-glycerin 0.012-0.2 % Soln  Commonly known as:  CLEAR EYES  Place 1-2 drops into both eyes every 4 (four) hours as needed. For itchy eyes     nitroGLYCERIN 0.4 MG SL tablet  Commonly known as:  NITROSTAT  Place 0.4 mg under the tongue every 5 (five) minutes as needed for chest pain.     verapamil 240 MG (CO) 24 hr tablet  Commonly known as:  COVERA HS  Take 240 mg by mouth at bedtime.       Allergies  Allergen Reactions   . Penicillins Hives  . Shellfish Allergy Other (See Comments)    "It makes my tongue swell and itch but I eat it anyway". She usually takes a Benadryl before eating it.      The results of significant diagnostics from this hospitalization (including imaging, microbiology, ancillary and laboratory) are listed below for reference.    Significant Diagnostic Studies: Dg Chest 2 View  05/23/2013   CLINICAL DATA:  Shortness of breath and chest pain  EXAM: CHEST  2 VIEW  COMPARISON:  June 05, 2012  FINDINGS: Lungs are clear. Heart size and pulmonary vascularity are normal. No adenopathy. No pneumothorax. No bone lesions.  IMPRESSION: No abnormality noted.   Electronically Signed   By: Bretta Bang M.D.   On: 05/23/2013 08:36    Microbiology: No results found for this or any previous visit (from the past 240 hour(s)).   Labs: Basic Metabolic Panel:  Recent Labs Lab 05/23/13 0800 05/23/13 1300  NA 135*  --   K 3.4*  --   CL 97  --   CO2 26  --   GLUCOSE 112*  --   BUN 19  --   CREATININE 0.84 0.79  CALCIUM 9.2  --    Liver Function Tests:  Recent Labs Lab 05/23/13 0800  AST 17  ALT 12  ALKPHOS 59  BILITOT 0.7  PROT 6.9  ALBUMIN 3.4*   No results found for this basename: LIPASE, AMYLASE,  in the last 168 hours No results found for this basename: AMMONIA,  in the last 168 hours CBC:  Recent Labs Lab 05/23/13 0800 05/23/13 1300  WBC 8.0 8.1  NEUTROABS 5.7  --   HGB 12.2 12.1  HCT 37.8 36.4  MCV 85.3 85.0  PLT 209 208   Cardiac Enzymes:  Recent Labs Lab 05/23/13 0800 05/23/13 1300 05/23/13 1808 05/23/13 2345  TROPONINI <0.30 <0.30 <0.30 <0.30   BNP: BNP (last 3 results)  Recent Labs  05/23/13 0800  PROBNP 868.4*   CBG: No results found for this basename: GLUCAP,  in the last 168 hours     Signed:  Murdock Jellison S  [facility] 05/24/2013, 2:51 PM

## 2013-05-26 NOTE — ED Provider Notes (Signed)
Medical screening examination/treatment/procedure(s) were performed by non-physician practitioner and as supervising physician I was immediately available for consultation/collaboration.   EKG Interpretation   Date/Time:  Monday May 23 2013 07:28:57 EST Ventricular Rate:  83 PR Interval:  151 QRS Duration: 104 QT Interval:  407 QTC Calculation: 478 R Axis:   18 Text Interpretation:  Sinus rhythm Probable left ventricular hypertrophy  Nonspecific T abnormalities, inferior leads ED PHYSICIAN INTERPRETATION  AVAILABLE IN [facility] Confirmed by TEST, Record ([id number]) on 05/25/2013  7:12:51 AM        Gilda Creasehristopher J. Shannan Slinker, MD 05/26/13 1108

## 2013-05-29 ENCOUNTER — Other Ambulatory Visit: Payer: Self-pay

## 2013-05-29 ENCOUNTER — Encounter (HOSPITAL_COMMUNITY): Payer: Self-pay | Admitting: Emergency Medicine

## 2013-05-29 ENCOUNTER — Emergency Department (HOSPITAL_COMMUNITY)
Admission: EM | Admit: 2013-05-29 | Discharge: 2013-05-29 | Disposition: A | Discharge status: Home / Self Care | Payer: No Typology Code available for payment source | Attending: Emergency Medicine | Admitting: Emergency Medicine

## 2013-05-29 ENCOUNTER — Emergency Department (HOSPITAL_COMMUNITY): Payer: No Typology Code available for payment source

## 2013-05-29 DIAGNOSIS — Z7982 Long term (current) use of aspirin: Secondary | ICD-10-CM | POA: Insufficient documentation

## 2013-05-29 DIAGNOSIS — Z79899 Other long term (current) drug therapy: Secondary | ICD-10-CM | POA: Insufficient documentation

## 2013-05-29 DIAGNOSIS — I498 Other specified cardiac arrhythmias: Secondary | ICD-10-CM | POA: Insufficient documentation

## 2013-05-29 DIAGNOSIS — Z880 Allergy status to penicillin: Secondary | ICD-10-CM | POA: Insufficient documentation

## 2013-05-29 DIAGNOSIS — Z9119 Patient's noncompliance with other medical treatment and regimen: Secondary | ICD-10-CM | POA: Insufficient documentation

## 2013-05-29 DIAGNOSIS — E669 Obesity, unspecified: Secondary | ICD-10-CM | POA: Insufficient documentation

## 2013-05-29 DIAGNOSIS — Z91199 Patient's noncompliance with other medical treatment and regimen due to unspecified reason: Secondary | ICD-10-CM | POA: Insufficient documentation

## 2013-05-29 DIAGNOSIS — Z87891 Personal history of nicotine dependence: Secondary | ICD-10-CM | POA: Insufficient documentation

## 2013-05-29 DIAGNOSIS — J45909 Unspecified asthma, uncomplicated: Secondary | ICD-10-CM | POA: Insufficient documentation

## 2013-05-29 DIAGNOSIS — F411 Generalized anxiety disorder: Secondary | ICD-10-CM | POA: Insufficient documentation

## 2013-05-29 DIAGNOSIS — R079 Chest pain, unspecified: Secondary | ICD-10-CM

## 2013-05-29 DIAGNOSIS — I4460 Unspecified fascicular block: Secondary | ICD-10-CM | POA: Insufficient documentation

## 2013-05-29 DIAGNOSIS — E785 Hyperlipidemia, unspecified: Secondary | ICD-10-CM | POA: Insufficient documentation

## 2013-05-29 DIAGNOSIS — R0789 Other chest pain: Secondary | ICD-10-CM | POA: Insufficient documentation

## 2013-05-29 DIAGNOSIS — I10 Essential (primary) hypertension: Secondary | ICD-10-CM | POA: Insufficient documentation

## 2013-05-29 LAB — CBC
HEMATOCRIT: 39.9 % (ref 36.0–46.0)
Hemoglobin: 13.4 g/dL (ref 12.0–15.0)
MCH: 28.6 pg (ref 26.0–34.0)
MCHC: 33.6 g/dL (ref 30.0–36.0)
MCV: 85.1 fL (ref 78.0–100.0)
Platelets: 219 10*3/uL (ref 150–400)
RBC: 4.69 MIL/uL (ref 3.87–5.11)
RDW: 14.3 % (ref 11.5–15.5)
WBC: 10.7 10*3/uL — AB (ref 4.0–10.5)

## 2013-05-29 LAB — BASIC METABOLIC PANEL
BUN: 19 mg/dL (ref 6–23)
CHLORIDE: 103 meq/L (ref 96–112)
CO2: 23 meq/L (ref 19–32)
CREATININE: 0.9 mg/dL (ref 0.50–1.10)
Calcium: 9.6 mg/dL (ref 8.4–10.5)
GFR calc Af Amer: 86 mL/min — ABNORMAL LOW (ref >90)
GFR calc non Af Amer: 74 mL/min — ABNORMAL LOW (ref >90)
Glucose, Bld: 111 mg/dL — ABNORMAL HIGH (ref 70–99)
Potassium: 3.7 mEq/L (ref 3.7–5.3)
Sodium: 138 mEq/L (ref 137–147)

## 2013-05-29 LAB — I-STAT CHEM 8, ED
BUN: 18 mg/dL (ref 6–23)
CALCIUM ION: 1.24 mmol/L — AB (ref 1.12–1.23)
CHLORIDE: 101 meq/L (ref 96–112)
Creatinine, Ser: 1.1 mg/dL (ref 0.50–1.10)
Glucose, Bld: 103 mg/dL — ABNORMAL HIGH (ref 70–99)
HCT: 43 % (ref 36.0–46.0)
HEMOGLOBIN: 14.6 g/dL (ref 12.0–15.0)
POTASSIUM: 3.6 meq/L — AB (ref 3.7–5.3)
Sodium: 139 mEq/L (ref 137–147)
TCO2: 25 mmol/L (ref 0–100)

## 2013-05-29 LAB — D-DIMER, QUANTITATIVE (NOT AT ARMC): D DIMER QUANT: 0.36 ug{FEU}/mL (ref 0.00–0.48)

## 2013-05-29 LAB — I-STAT TROPONIN, ED
TROPONIN I, POC: 0.02 ng/mL (ref 0.00–0.08)
TROPONIN I, POC: 0.06 ng/mL (ref 0.00–0.08)
Troponin i, poc: 0.03 ng/mL (ref 0.00–0.08)

## 2013-05-29 MED ORDER — IBUPROFEN 600 MG PO TABS: 600.0000 mg | ORAL_TABLET | Freq: Four times a day (QID) | ORAL | Status: DC | PRN

## 2013-05-29 MED ORDER — KETOROLAC TROMETHAMINE 30 MG/ML IJ SOLN
30.0000 mg | Freq: Once | INTRAMUSCULAR | Status: AC
  Administered 2013-05-29: 30 mg via INTRAVENOUS
  Filled 2013-05-29: qty 1

## 2013-05-29 MED ORDER — ASPIRIN 81 MG PO CHEW
324.0000 mg | CHEWABLE_TABLET | Freq: Once | ORAL | Status: AC
  Administered 2013-05-29: 243 mg via ORAL
  Filled 2013-05-29: qty 4

## 2013-05-29 MED ORDER — NITROGLYCERIN 0.4 MG SL SUBL: 0.4000 mg | SUBLINGUAL_TABLET | SUBLINGUAL | Status: DC | PRN

## 2013-05-29 MED ORDER — MORPHINE SULFATE 4 MG/ML IJ SOLN
4.0000 mg | Freq: Once | INTRAMUSCULAR | Status: AC
  Administered 2013-05-29: 4 mg via INTRAVENOUS
  Filled 2013-05-29: qty 1

## 2013-05-29 NOTE — ED Provider Notes (Signed)
CSN: [id number]     Arrival date & time 05/29/13  1828 History   First MD Initiated Contact with Patient 05/29/13 1904     Chief Complaint  Patient presents with  . Chest Pain     HPI  48 yo F with hx of SVT, coronary vasospasm (NSTEMI) in 10/2010 with no CAD on cath in 10/2011 who [facility] atypical CP. She was admitted overnight and troponin's were wnl. Cardiology was consulted and felt this was unlikely;y cardiac. Patient now presents today with intermittent anterior CP. CP is described as an ache and worsens with movement , palpation and deep breathing.  Patient denies any OCP use or long travels. No unilateral leg swelling. No recent surgery or trauma. She states she has had constant CP today for 3.5 hours. No reported fevers or cough.  Hx of asthma but no SOB or wheeze now.   Past Medical History  Diagnosis Date  . Anxiety   . Hypertension   . SVT (supraventricular tachycardia) 10/22/10    wide complex tachycardia of same QRS as intinsic LBBB  . LBBB (left bundle branch block)   . Noncompliance   . Hyperlipidemia   . Asthma   . Atypical chest pain     recent low risk myoview  . Coronary artery spasm     a. NSTEMI 10/2011 with no CAD by cath but evidence of spasm when injecting left coronary artery.   . Abnormal TSH     10/2011 - additional hormones pending   Past Surgical History  Procedure Laterality Date  . Tubal ligation     Family History  Problem Relation Age of Onset  . Stroke Mother    History  Substance Use Topics  . Smoking status: Former Smoker    Quit date: 11/06/1996  . Smokeless tobacco: Never Used  . Alcohol Use: 3.0 oz/week    5 Cans of beer per week     Comment: heavy ETOH (1 pint of liquor most days) until last visit.  states that she no longer drinks ETOH   OB History   Grav Para Term Preterm Abortions TAB SAB Ect Mult Living                 Review of Systems  Constitutional: Negative for fever, chills, activity change  and appetite change.  HENT: Negative for congestion, ear pain and rhinorrhea.   Eyes: Negative for pain.  Respiratory: Negative for cough and shortness of breath.   Cardiovascular: Positive for chest pain. Negative for palpitations.  Gastrointestinal: Negative for nausea, vomiting and abdominal pain.  Genitourinary: Negative for dysuria, difficulty urinating and pelvic pain.  Musculoskeletal: Negative for back pain and neck pain.  Skin: Negative for rash and wound.  Neurological: Negative for weakness and headaches.  Psychiatric/Behavioral: Negative for behavioral problems, confusion and agitation.      Allergies  Penicillins and Shellfish allergy  Home Medications   Current Outpatient Rx  Name  Route  Sig  Dispense  Refill  . albuterol (PROVENTIL HFA;VENTOLIN HFA) 108 (90 BASE) MCG/ACT inhaler   Inhalation   Inhale 2 puffs into the lungs every 6 (six) hours as needed. For wheezing         . aspirin 81 MG EC tablet   Oral   Take 81 mg by mouth every morning.          [name]. atorvastatin (LIPITOR) 10 MG tablet   Oral   Take 10 mg by  mouth at bedtime.         . carvedilol (COREG) 25 MG tablet   Oral   Take 1 tablet (25 mg total) by mouth 2 (two) times daily with a meal.   60 tablet   6   . Hydrocodone-Acetaminophen (VICODIN) 5-300 MG TABS   Oral   Take 1 tablet by mouth every 8 (eight) hours as needed.   30 each   0   . Iron 66 MG TABS   Oral   Take 65 mg by mouth every morning.          . isosorbide mononitrate (IMDUR) 60 MG 24 hr tablet   Oral   Take 60 mg by mouth every morning.         [name] lisinopril (PRINIVIL,ZESTRIL) 20 MG tablet   Oral   Take 2 tablets (40 mg total) by mouth daily.   30 tablet   0   . naphazoline-glycerin (CLEAR EYES) 0.012-0.2 % SOLN   Both Eyes   Place 1-2 drops into both eyes every 4 (four) hours as needed. For itchy eyes         . nitroGLYCERIN (NITROSTAT) 0.4 MG SL tablet   Sublingual   Place 0.4 mg under the tongue  every 5 (five) minutes as needed for chest pain.         . verapamil (COVERA HS) 240 MG (CO) 24 hr tablet   Oral   Take 240 mg by mouth at bedtime.           BP 175/120  Pulse 70  Temp(Src) 98.2 F (36.8 C) (Oral)  Resp 20  SpO2 100%  LMP 05/16/2013 Physical Exam  Constitutional: She is oriented to person, place, and time. She appears well-developed and well-nourished. No distress.  Obese   HENT:  Head: Normocephalic and atraumatic.  Nose: Nose normal.  Mouth/Throat: Oropharynx is clear and moist.  Eyes: EOM are normal. Pupils are equal, round, and reactive to light.  Neck: Normal range of motion. Neck supple. No tracheal deviation present.  Cardiovascular: Normal rate, regular rhythm, normal heart sounds and intact distal pulses.   Pulmonary/Chest: Effort normal and breath sounds normal. She has no rales. She exhibits tenderness.  Abdominal: Soft. Bowel sounds are normal. She exhibits no distension. There is no tenderness. There is no rebound and no guarding.  Musculoskeletal: Normal range of motion. She exhibits no tenderness.  Neurological: She is alert and oriented to person, place, and time.  Skin: Skin is warm and dry. No rash noted.  Psychiatric: She has a normal mood and affect. Her behavior is normal.    ED Course  Procedures (including critical care time) Labs Review Labs Reviewed  CBC - Abnormal; Notable for the following:    WBC 10.7 (*)    All other components within normal limits  BASIC METABOLIC PANEL - Abnormal; Notable for the following:    Glucose, Bld 111 (*)    GFR calc non Af Amer 74 (*)    GFR calc Af Amer 86 (*)    All other components within normal limits  I-STAT CHEM 8, ED - Abnormal; Notable for the following:    Potassium 3.6 (*)    Glucose, Bld 103 (*)    Calcium, Ion 1.24 (*)    All other components within normal limits  D-DIMER, QUANTITATIVE  I-STAT TROPOININ, ED  I-STAT TROPOININ, ED  I-STAT TROPOININ, ED  Rosezena Sensor, ED    Imaging Review Dg Chest 2 View  05/29/2013  CLINICAL DATA:  Chest pain.  EXAM: CHEST  2 VIEW  COMPARISON:  May 23, 2013.  FINDINGS: The heart size and mediastinal contours are within normal limits. Both lungs are clear. No pleural effusion or pneumothorax is noted. The visualized skeletal structures are unremarkable.  IMPRESSION: No acute cardiopulmonary abnormality seen.   Electronically Signed   By: Roque Lias M.D.   On: 05/29/2013 20:57     MDM   Final diagnoses:  Chest pain   48 yo F with atypical CP who was recently hospitalized overnight for a similar episode and had an enzyme rule out with multiple negative troponin's since then she has continued to have intermittent CP. CP is reproducible on exam. CXR with NACPD. EKG with LBBB similar to prior EKG. Troponin wnl x 2. Doubt ACS. CP improved with toradol and morphine. Dimer neg doubt PE. Thorough discussion with patient on plan, findings, return precautions. Will follow up with PCP in 1-2 days or sooner in ED if any worsening or any concerns. She was specifically instructed to call 911 if she hard worsening of her COP, any SOB, N/V. Case co managed with my attending Dr. Elesa Massed.      Nadara Mustard, MD 05/30/13 [phone]

## 2013-05-29 NOTE — ED Notes (Signed)
PT states was recently admitted for Chest pain and they could not find anything wrong with her.  Pt states today her entire chest started hurting and it radiated across chest and down right arm with numbness.Reports sob

## 2013-05-30 NOTE — ED Provider Notes (Signed)
I saw and evaluated the patient, reviewed the resident's note and I agree with the findings and plan.   Date: 05/29/2013 23:28  Rate: 65  Rhythm: normal sinus rhythm  QRS Axis: normal  Intervals: normal  ST/T Wave abnormalities: T wave inversions in inferior and lateral leads  Conduction Disutrbances: LBBB  Narrative Interpretation:  LBBB, T wave inversions in inferior and lateral lead; no change compared to prior EKG   Pt is a 48 y.o. F with history of SVT, in STEMI with coronary vasospasm and August 2013 with negative cath who was recently admitted to the hospital for atypical chest pain and had 3 sets of negative cardiac enzymes returns today with constant right-sided sharp chest pain that radiates into her right arm is worse with palpation of her chest and twisting, turning of her chest wall. She denies any shortness of breath, nausea or vomiting, diaphoresis. Her labs including 2 sets of cardiac enzymes are negative. Her EKG shows a left bundle branch block but no new ischemic changes. Chest x-ray is clear. D-dimer is also negative. I suspect patient's pain is due to chest wall pain I feel she is safe to be discharged. Have discussed strict return precautions with patient and need to follow up with her cardiologist. She verbalizes understanding and is comfortable with plan.     Marie MawKristen N Annalissa Murphey, DO 05/30/13 0045

## 2014-03-02 ENCOUNTER — Encounter (HOSPITAL_COMMUNITY): Payer: Self-pay | Admitting: Cardiovascular Disease

## 2015-01-26 ENCOUNTER — Emergency Department (HOSPITAL_COMMUNITY): Payer: No Typology Code available for payment source

## 2015-01-26 ENCOUNTER — Encounter (HOSPITAL_COMMUNITY): Payer: Self-pay | Admitting: *Deleted

## 2015-01-26 ENCOUNTER — Emergency Department (HOSPITAL_COMMUNITY)
Admission: EM | Admit: 2015-01-26 | Discharge: 2015-01-26 | Disposition: A | Discharge status: Home / Self Care | Payer: Self-pay | Attending: Emergency Medicine | Admitting: Emergency Medicine

## 2015-01-26 DIAGNOSIS — Z880 Allergy status to penicillin: Secondary | ICD-10-CM | POA: Insufficient documentation

## 2015-01-26 DIAGNOSIS — Z9889 Other specified postprocedural states: Secondary | ICD-10-CM | POA: Insufficient documentation

## 2015-01-26 DIAGNOSIS — J45909 Unspecified asthma, uncomplicated: Secondary | ICD-10-CM | POA: Insufficient documentation

## 2015-01-26 DIAGNOSIS — R0789 Other chest pain: Secondary | ICD-10-CM | POA: Insufficient documentation

## 2015-01-26 DIAGNOSIS — R61 Generalized hyperhidrosis: Secondary | ICD-10-CM | POA: Insufficient documentation

## 2015-01-26 DIAGNOSIS — I10 Essential (primary) hypertension: Secondary | ICD-10-CM | POA: Insufficient documentation

## 2015-01-26 DIAGNOSIS — I471 Supraventricular tachycardia: Secondary | ICD-10-CM | POA: Insufficient documentation

## 2015-01-26 DIAGNOSIS — Z9119 Patient's noncompliance with other medical treatment and regimen: Secondary | ICD-10-CM | POA: Insufficient documentation

## 2015-01-26 DIAGNOSIS — Z87891 Personal history of nicotine dependence: Secondary | ICD-10-CM | POA: Insufficient documentation

## 2015-01-26 DIAGNOSIS — I252 Old myocardial infarction: Secondary | ICD-10-CM | POA: Insufficient documentation

## 2015-01-26 DIAGNOSIS — E785 Hyperlipidemia, unspecified: Secondary | ICD-10-CM | POA: Insufficient documentation

## 2015-01-26 DIAGNOSIS — Z8659 Personal history of other mental and behavioral disorders: Secondary | ICD-10-CM | POA: Insufficient documentation

## 2015-01-26 DIAGNOSIS — Z79899 Other long term (current) drug therapy: Secondary | ICD-10-CM | POA: Insufficient documentation

## 2015-01-26 LAB — CBC
HCT: 39 % (ref 36.0–46.0)
Hemoglobin: 12.3 g/dL (ref 12.0–15.0)
MCH: 26.1 pg (ref 26.0–34.0)
MCHC: 31.5 g/dL (ref 30.0–36.0)
MCV: 82.8 fL (ref 78.0–100.0)
Platelets: 243 10*3/uL (ref 150–400)
RBC: 4.71 MIL/uL (ref 3.87–5.11)
RDW: 14.7 % (ref 11.5–15.5)
WBC: 8.2 10*3/uL (ref 4.0–10.5)

## 2015-01-26 LAB — BASIC METABOLIC PANEL
Anion gap: 7 (ref 5–15)
BUN: 26 mg/dL — AB (ref 6–20)
CALCIUM: 9.4 mg/dL (ref 8.9–10.3)
CO2: 27 mmol/L (ref 22–32)
Chloride: 106 mmol/L (ref 101–111)
Creatinine, Ser: 1.24 mg/dL — ABNORMAL HIGH (ref 0.44–1.00)
GFR calc non Af Amer: 50 mL/min — ABNORMAL LOW (ref >60)
GFR, EST AFRICAN AMERICAN: 58 mL/min — AB (ref >60)
Glucose, Bld: 144 mg/dL — ABNORMAL HIGH (ref 65–99)
Potassium: 3.7 mmol/L (ref 3.5–5.1)
SODIUM: 140 mmol/L (ref 135–145)

## 2015-01-26 LAB — I-STAT TROPONIN, ED: Troponin i, poc: 0.02 ng/mL (ref 0.00–0.08)

## 2015-01-26 MED ORDER — ISOSORBIDE MONONITRATE ER 60 MG PO TB24: 60.0000 mg | ORAL_TABLET | Freq: Every morning | ORAL | Status: DC

## 2015-01-26 MED ORDER — NITROGLYCERIN 0.4 MG SL SUBL
0.4000 mg | SUBLINGUAL_TABLET | SUBLINGUAL | Status: DC | PRN
  Administered 2015-01-26: 0.4 mg via SUBLINGUAL
  Filled 2015-01-26: qty 1

## 2015-01-26 MED ORDER — ASPIRIN 81 MG PO CHEW
324.0000 mg | CHEWABLE_TABLET | Freq: Once | ORAL | Status: AC
Start: 2015-01-26 — End: 2015-01-26
  Administered 2015-01-26: 324 mg via ORAL
  Filled 2015-01-26: qty 4

## 2015-01-26 NOTE — ED Notes (Signed)
Pt has equal grip strength bilat UE's.

## 2015-01-26 NOTE — ED Notes (Signed)
Patient transported to X-ray 

## 2015-01-26 NOTE — ED Notes (Signed)
Pt stated "started having right side chest pain yesterday, my right arm/hand feel weak"

## 2015-01-26 NOTE — ED Notes (Signed)
Pt requesting something to eat and drink.  Informed will speak to Dr. Clydene PughKnott.  Pt verbalized understanding.

## 2015-01-26 NOTE — Progress Notes (Signed)
CM spoke with pt who [place] resident [place]cp.  CM discussed and provided written information for uninsured accepting pcps, discussed the importance of pcp vs EDP services for f/u care, www.needymeds.org, www.goodrx.com, discounted pharmacies and other [place] resources such as [facility] , [facility], affordable care act, financial assistance, uninsured dental services, [place] med assist, DSS and  health department  Reviewed resources for [place] uninsured accepting pcps like [facility], family medicine at [address], [facility], [facility], local urgent care centers, [facility], [facility], general medical clinics, family services of the [facility], [facility] urgent care plus others, medication resources, [facility] out patient pharmacies and housing Pt voiced understanding and appreciation of resources provided   Provided [facility] contact information Pt states [place] is originally from [place] in [place] for about a year and has recently moved back to [place]. States she has been working on getting orange card and is familiar with the process Has not been able to get tax information to complete application  Cm emphasize use of goodrx To help find best cost with coupons as needed), needymeds.org and [place] med assist (for discount on chronic medications)

## 2015-01-26 NOTE — ED Notes (Signed)
Bed: WA21 Expected date:  Expected time:  Means of arrival:  Comments: [id number]- ems fall

## 2015-01-26 NOTE — Discharge Instructions (Signed)

## 2015-01-26 NOTE — ED Notes (Signed)
Spoke with Dr. Clydene PughKnott & informed of pt's request for drink & crackers.  Per Dr. Clydene PughKnott, pt may have.

## 2015-01-26 NOTE — ED Provider Notes (Signed)
CSN: [id number]     Arrival date & time 01/26/15  1604 History   First MD Initiated Contact with Patient 01/26/15 1620     Chief Complaint  Patient presents with  . Chest Pain    right     (Consider location/radiation/quality/duration/timing/severity/associated sxs/prior Treatment) Patient is a 49 y.o. female presenting with chest pain. The history is provided by the patient.  Chest Pain Pain location:  Substernal area Pain quality: radiating and sharp   Pain radiates to:  R arm (right chest) Pain radiates to the back: no   Pain severity:  Moderate Onset quality:  Gradual Duration:  1 day Timing:  Intermittent Progression:  Waxing and waning Chronicity:  New Context: movement and at rest   Context: not breathing   Relieved by:  Nothing Worsened by:  Nothing tried Ineffective treatments:  None tried Associated symptoms: diaphoresis   Associated symptoms: no abdominal pain, no cough, no fever, no lower extremity edema and no shortness of breath   Risk factors: high cholesterol, hypertension and obesity   Risk factors: no coronary artery disease, no diabetes mellitus and no smoking     Past Medical History  Diagnosis Date  . Anxiety   . Hypertension   . SVT (supraventricular tachycardia) (HCC) 10/22/10    wide complex tachycardia of same QRS as intinsic LBBB  . LBBB (left bundle branch block)   . Noncompliance   . Hyperlipidemia   . Asthma   . Atypical chest pain     recent low risk myoview  . Coronary artery spasm (HCC)     a. NSTEMI 10/2011 with no CAD by cath but evidence of spasm when injecting left coronary artery.   . Abnormal TSH     10/2011 - additional hormones pending   Past Surgical History  Procedure Laterality Date  . Tubal ligation    . Left heart catheterization with coronary angiogram N/A 11/20/2011    Procedure: LEFT HEART CATHETERIZATION WITH CORONARY ANGIOGRAM;  Surgeon: Tonny BollmanMichael Cooper, MD;  [facility];  Service: Cardiovascular;   Laterality: N/A;   Family History  Problem Relation Age of Onset  . Stroke Mother    Social History  Substance Use Topics  . Smoking status: Former Smoker    Quit date: 11/06/1996  . Smokeless tobacco: Never Used  . Alcohol Use: 3.0 oz/week    5 Cans of beer per week     Comment: heavy ETOH (1 pint of liquor most days) until last visit.  states that she no longer drinks ETOH   OB History    No data available     Review of Systems  Constitutional: Positive for diaphoresis. Negative for fever.  Respiratory: Negative for cough and shortness of breath.   Cardiovascular: Positive for chest pain.  Gastrointestinal: Negative for abdominal pain.  All other systems reviewed and are negative.     Allergies  Penicillins and Shellfish allergy  Home Medications   Prior to Admission medications   Medication Sig Start Date End Date Taking? Authorizing Provider  albuterol (PROVENTIL HFA;VENTOLIN HFA) 108 (90 BASE) MCG/ACT inhaler Inhale 2 puffs into the lungs every 6 (six) hours as needed. For wheezing   Yes Historical Provider, MD  atorvastatin (LIPITOR) 10 MG tablet Take 10 mg by mouth daily.    Yes Historical Provider, MD  carvedilol (COREG) 25 MG tablet Take 1 tablet (25 mg total) by mouth 2 (two) times daily with a meal. 11/21/11  Yes Dayna N Dunn, PA-C  ibuprofen (  ADVIL,MOTRIN) 600 MG tablet Take 1 tablet (600 mg total) by mouth every 6 (six) hours as needed. 05/29/13  Yes Nadara Mustard, MD  Iron 66 MG TABS Take 65 mg by mouth every morning.    Yes Historical Provider, MD  isosorbide mononitrate (IMDUR) 60 MG 24 hr tablet Take 60 mg by mouth every morning.   Yes Historical Provider, MD  lisinopril (PRINIVIL,ZESTRIL) 20 MG tablet Take 2 tablets (40 mg total) by mouth daily. 08/16/12  Yes Rolan Bucco, MD  naphazoline-glycerin (CLEAR EYES) 0.012-0.2 % SOLN Place 1-2 drops into both eyes every 4 (four) hours as needed. For itchy eyes   Yes Historical Provider, MD  NIFEdipine (NIFEDICAL  XL) 60 MG 24 hr tablet Take 60 mg by mouth daily.   Yes Historical Provider, MD  nitroGLYCERIN (NITROSTAT) 0.4 MG SL tablet Place 0.4 mg under the tongue every 5 (five) minutes as needed for chest pain.   Yes Historical Provider, MD   BP 192/122 mmHg  Pulse 77  Temp(Src) 98.3 F (36.8 C) (Oral)  Resp 12  SpO2 100% Physical Exam  Constitutional: She is oriented to person, place, and time. She appears well-developed and well-nourished. No distress.  HENT:  Head: Normocephalic.  Eyes: Conjunctivae are normal.  Neck: Neck supple. No tracheal deviation present.  Cardiovascular: Normal rate and regular rhythm.  Exam reveals no gallop and no friction rub.   No murmur heard. Pulmonary/Chest: Effort normal. No respiratory distress. She has no wheezes. She has no rales. She exhibits tenderness (over precordium, reproduces symptoms of sharp pain).  Abdominal: Soft. She exhibits no distension.  Neurological: She is alert and oriented to person, place, and time.  Skin: Skin is warm and dry.  Psychiatric: She has a normal mood and affect.    ED Course  Procedures (including critical care time) Labs Review Labs Reviewed  BASIC METABOLIC PANEL - Abnormal; Notable for the following:    Glucose, Bld 144 (*)    BUN 26 (*)    Creatinine, Ser 1.24 (*)    GFR calc non Af Amer 50 (*)    GFR calc Af Amer 58 (*)    All other components within normal limits  CBC  I-STAT TROPOININ, ED  I-STAT TROPOININ, ED    Imaging Review Dg Chest 2 View  01/26/2015  CLINICAL DATA:  Chest pain EXAM: CHEST  2 VIEW COMPARISON:  05/29/2013 chest radiograph FINDINGS: Stable cardiomediastinal silhouette with mild cardiomegaly. No pneumothorax. No pleural effusion. Clear lungs, with no focal lung consolidation and no pulmonary edema. IMPRESSION: Stable mild cardiomegaly, with no pulmonary edema. No active pulmonary disease. Electronically Signed   By: Delbert Phenix M.D.   On: 01/26/2015 17:39   I have personally reviewed  and evaluated these images and lab results as part of my medical decision-making.   EKG Interpretation   Date/Time:  Friday January 26 2015 16:12:58 EDT Ventricular Rate:  69 PR Interval:  185 QRS Duration: 106 QT Interval:  424 QTC Calculation: 454 R Axis:   18 Text Interpretation:  Sinus rhythm Probable anterolateral infarct, old No  significant change since last tracing Confirmed by Viola Placeres MD, Rylee Huestis  ([id number]) on 01/26/2015 4:20:57 PM      MDM   Final diagnoses:  Atypical chest pain    49 y.o. female presents with atypical sharp right sided chest pain that is reproducible to palpation. Doubt ACS. Troponin negative despite >24 hrs of symptoms. Had negative cath in 2013. Pt states pain has been worse since running  out of imdur, unable to see PCP for refill. Rx refilled, recommended full dose daily aspirin, follow up with PCP as needed and return precautions discussed for worsening.     Lyndal Pulley, MD 01/27/15 [phone]

## 2015-01-26 NOTE — ED Notes (Signed)
Dr. Clydene PughKnott in room 1736.  Give NTG x 1.

## 2015-03-09 ENCOUNTER — Emergency Department (HOSPITAL_COMMUNITY)
Admission: EM | Admit: 2015-03-09 | Discharge: 2015-03-09 | Disposition: A | Discharge status: Home / Self Care | Payer: No Typology Code available for payment source | Attending: Emergency Medicine | Admitting: Emergency Medicine

## 2015-03-09 ENCOUNTER — Encounter (HOSPITAL_COMMUNITY): Payer: Self-pay

## 2015-03-09 DIAGNOSIS — J45909 Unspecified asthma, uncomplicated: Secondary | ICD-10-CM | POA: Insufficient documentation

## 2015-03-09 DIAGNOSIS — Z8659 Personal history of other mental and behavioral disorders: Secondary | ICD-10-CM | POA: Insufficient documentation

## 2015-03-09 DIAGNOSIS — Z9119 Patient's noncompliance with other medical treatment and regimen: Secondary | ICD-10-CM | POA: Insufficient documentation

## 2015-03-09 DIAGNOSIS — E785 Hyperlipidemia, unspecified: Secondary | ICD-10-CM | POA: Insufficient documentation

## 2015-03-09 DIAGNOSIS — R030 Elevated blood-pressure reading, without diagnosis of hypertension: Secondary | ICD-10-CM

## 2015-03-09 DIAGNOSIS — Z880 Allergy status to penicillin: Secondary | ICD-10-CM | POA: Insufficient documentation

## 2015-03-09 DIAGNOSIS — IMO0001 Reserved for inherently not codable concepts without codable children: Secondary | ICD-10-CM

## 2015-03-09 DIAGNOSIS — I10 Essential (primary) hypertension: Secondary | ICD-10-CM | POA: Insufficient documentation

## 2015-03-09 DIAGNOSIS — Z9889 Other specified postprocedural states: Secondary | ICD-10-CM | POA: Insufficient documentation

## 2015-03-09 DIAGNOSIS — Z760 Encounter for issue of repeat prescription: Secondary | ICD-10-CM | POA: Insufficient documentation

## 2015-03-09 DIAGNOSIS — Z79899 Other long term (current) drug therapy: Secondary | ICD-10-CM | POA: Insufficient documentation

## 2015-03-09 DIAGNOSIS — Z87891 Personal history of nicotine dependence: Secondary | ICD-10-CM | POA: Insufficient documentation

## 2015-03-09 MED ORDER — ISOSORBIDE MONONITRATE ER 60 MG PO TB24: 60.0000 mg | ORAL_TABLET | Freq: Every day | ORAL | Status: DC

## 2015-03-09 MED ORDER — ATORVASTATIN CALCIUM 10 MG PO TABS: 10.0000 mg | ORAL_TABLET | Freq: Every day | ORAL | Status: DC

## 2015-03-09 MED ORDER — CARVEDILOL 25 MG PO TABS: 25.0000 mg | ORAL_TABLET | Freq: Two times a day (BID) | ORAL | Status: DC

## 2015-03-09 MED ORDER — NIFEDIPINE ER OSMOTIC RELEASE 60 MG PO TB24: 60.0000 mg | ORAL_TABLET | Freq: Every day | ORAL | Status: DC

## 2015-03-09 MED ORDER — LISINOPRIL 20 MG PO TABS: 20.0000 mg | ORAL_TABLET | Freq: Every day | ORAL | Status: DC

## 2015-03-09 NOTE — ED Notes (Signed)
Patient states that [place] recently [place] from [place].  The clinic in [place] will not call her medications in without being seen and patient does not have a way to [place] to see them.  Patient states that has an appointment with new PCP on 1/16 , this was the earliest they could see her for re certification.  Patient is out of isosorbide and took her last doses of carbetolol and lisinopril today.  Patient also c/o HA x2 days.  Rates pain of HA at 6/10.

## 2015-03-09 NOTE — ED Notes (Signed)
Took last dosage of BP meds this am

## 2015-03-09 NOTE — Discharge Instructions (Signed)
Medications: refill of usual home medications Follow Up: Please follow up with your primary doctor at the next available appointment for discussion of your diagnoses and further evaluation after today's visit; Please return to the ER for concerning chest pain, any new or worsening symptoms, any additional concerns.

## 2015-03-09 NOTE — ED Provider Notes (Signed)
CSN: [id number]     Arrival date & time 03/09/15  0930 History   None    Chief Complaint  Patient presents with  . Medication Refill     (Consider location/radiation/quality/duration/timing/severity/associated sxs/prior Treatment) The history is provided by the patient and medical records. No language interpreter was used.    [name] is a 49 y.o. female  with a PMH of HTN, HLD, and asthma who presents to the Emergency Department for medication refill. She has no complaints at this time. She has recently moved back to [place], and the earliest PCP appointment she could get was 1/16, however she is now out of her medications for HTN, HLD, and coronary artery spasm.  Past Medical History  Diagnosis Date  . Anxiety   . Hypertension   . SVT (supraventricular tachycardia) (HCC) 10/22/10    wide complex tachycardia of same QRS as intinsic LBBB  . LBBB (left bundle branch block)   . Noncompliance   . Hyperlipidemia   . Asthma   . Atypical chest pain     recent low risk myoview  . Coronary artery spasm (HCC)     a. NSTEMI 10/2011 with no CAD by cath but evidence of spasm when injecting left coronary artery.   . Abnormal TSH     10/2011 - additional hormones pending   Past Surgical History  Procedure Laterality Date  . Tubal ligation    . Left heart catheterization with coronary angiogram N/A 11/20/2011    Procedure: LEFT HEART CATHETERIZATION WITH CORONARY ANGIOGRAM;  Surgeon: Tonny Bollman, MD;  Location: [facility] CATH LAB;  Service: Cardiovascular;  Laterality: N/A;   Family History  Problem Relation Age of Onset  . Stroke Mother    Social History  Substance Use Topics  . Smoking status: Former Smoker    Quit date: 11/06/1996  . Smokeless tobacco: Never Used  . Alcohol Use: 3.0 oz/week    5 Cans of beer per week     Comment: socially on weekends   OB History    No data available     Review of Systems  Constitutional: Negative for fever and chills.  Respiratory:  Negative for cough, shortness of breath and wheezing.   Cardiovascular: Negative.   Neurological: Negative for dizziness, weakness and numbness.  Hematological: Does not bruise/bleed easily.      Allergies  Penicillins and Shellfish allergy  Home Medications   Prior to Admission medications   Medication Sig Start Date End Date Taking? Authorizing Provider  albuterol (PROVENTIL HFA;VENTOLIN HFA) 108 (90 BASE) MCG/ACT inhaler Inhale 2 puffs into the lungs every 6 (six) hours as needed. For wheezing    Historical Provider, MD  atorvastatin (LIPITOR) 10 MG tablet Take 1 tablet (10 mg total) by mouth daily. 03/09/15   Chase Picket Arneisha Kincannon, PA-C  carvedilol (COREG) 25 MG tablet Take 1 tablet (25 mg total) by mouth 2 (two) times daily with a meal. 03/09/15   Chase Picket Jesus Nevills, PA-C  ibuprofen (ADVIL,MOTRIN) 600 MG tablet Take 1 tablet (600 mg total) by mouth every 6 (six) hours as needed. 05/29/13   Nadara Mustard, MD  Iron 66 MG TABS Take 65 mg by mouth every morning.     Historical Provider, MD  isosorbide mononitrate (IMDUR) 60 MG 24 hr tablet Take 1 tablet (60 mg total) by mouth daily. 03/09/15   Chase Picket Kemar Pandit, PA-C  lisinopril (PRINIVIL,ZESTRIL) 20 MG tablet Take 1 tablet (20 mg total) by mouth daily. 03/09/15   Chase Picket Inocencia Murtaugh,  PA-C  naphazoline-glycerin (CLEAR EYES) 0.012-0.2 % SOLN Place 1-2 drops into both eyes every 4 (four) hours as needed. For itchy eyes    Historical Provider, MD  NIFEdipine (PROCARDIA XL/ADALAT-CC) 60 MG 24 hr tablet Take 1 tablet (60 mg total) by mouth daily. 03/09/15   Chase PicketJaime Pilcher Emery Dupuy, PA-C  nitroGLYCERIN (NITROSTAT) 0.4 MG SL tablet Place 0.4 mg under the tongue every 5 (five) minutes as needed for chest pain.    Historical Provider, MD   BP 154/117 mmHg  Pulse 68  Resp 18  SpO2 98% Physical Exam  Constitutional: She is oriented to person, place, and time. She appears well-developed and well-nourished.  Alert and in no acute distress  HENT:  Head:  Normocephalic and atraumatic.  Cardiovascular: Normal rate, regular rhythm, normal heart sounds and intact distal pulses.  Exam reveals no gallop and no friction rub.   No murmur heard. Pulmonary/Chest: Effort normal and breath sounds normal. No respiratory distress. She has no wheezes. She has no rales. She exhibits no tenderness.  Abdominal: She exhibits no mass. There is no rebound and no guarding.  Abdomen soft, non-tender, non-distended Bowel sounds positive in all four quadrants  Musculoskeletal: She exhibits no edema.  Neurological: She is alert and oriented to person, place, and time.  Skin: Skin is warm and dry. No rash noted.  Nursing note and vitals reviewed.   ED Course  Procedures (including critical care time) Labs Review Labs Reviewed - No data to display  Imaging Review No results found. I have personally reviewed and evaluated these images and lab results as part of my medical decision-making.   EKG Interpretation None      MDM   Final diagnoses:  Encounter for medication refill  Elevated blood pressure   [name] presents for medication refill with no complaints at this time. Denies sob, chest pain, ha, abdominal pain.  A&P: Encounter for medication refill  - Carvedilol, nifedioine, lisinopril, isosorbide, and atorvastatin refilled  - Strict return precautions given  - Keep PCP appointment for January 16th Elevated BP  - Take home meds as directed  - Follow up with PCP   Chase PicketJaime Pilcher Summer Parthasarathy, PA-C 03/09/15 1128  Laurence Spatesachel Morgan Little, MD 03/11/15 [phone]

## 2016-09-29 DIAGNOSIS — J4520 Mild intermittent asthma, uncomplicated: Secondary | ICD-10-CM | POA: Insufficient documentation

## 2017-05-25 DIAGNOSIS — G8929 Other chronic pain: Secondary | ICD-10-CM | POA: Insufficient documentation

## 2017-10-10 IMAGING — CR DG CHEST 2V
2 series · 2 of 2 positions shown · non-contrast
Comparison: 05/29/2013 chest radiograph

CLINICAL DATA: Chest pain

EXAM:
CHEST  2 VIEW

[w chest pa]
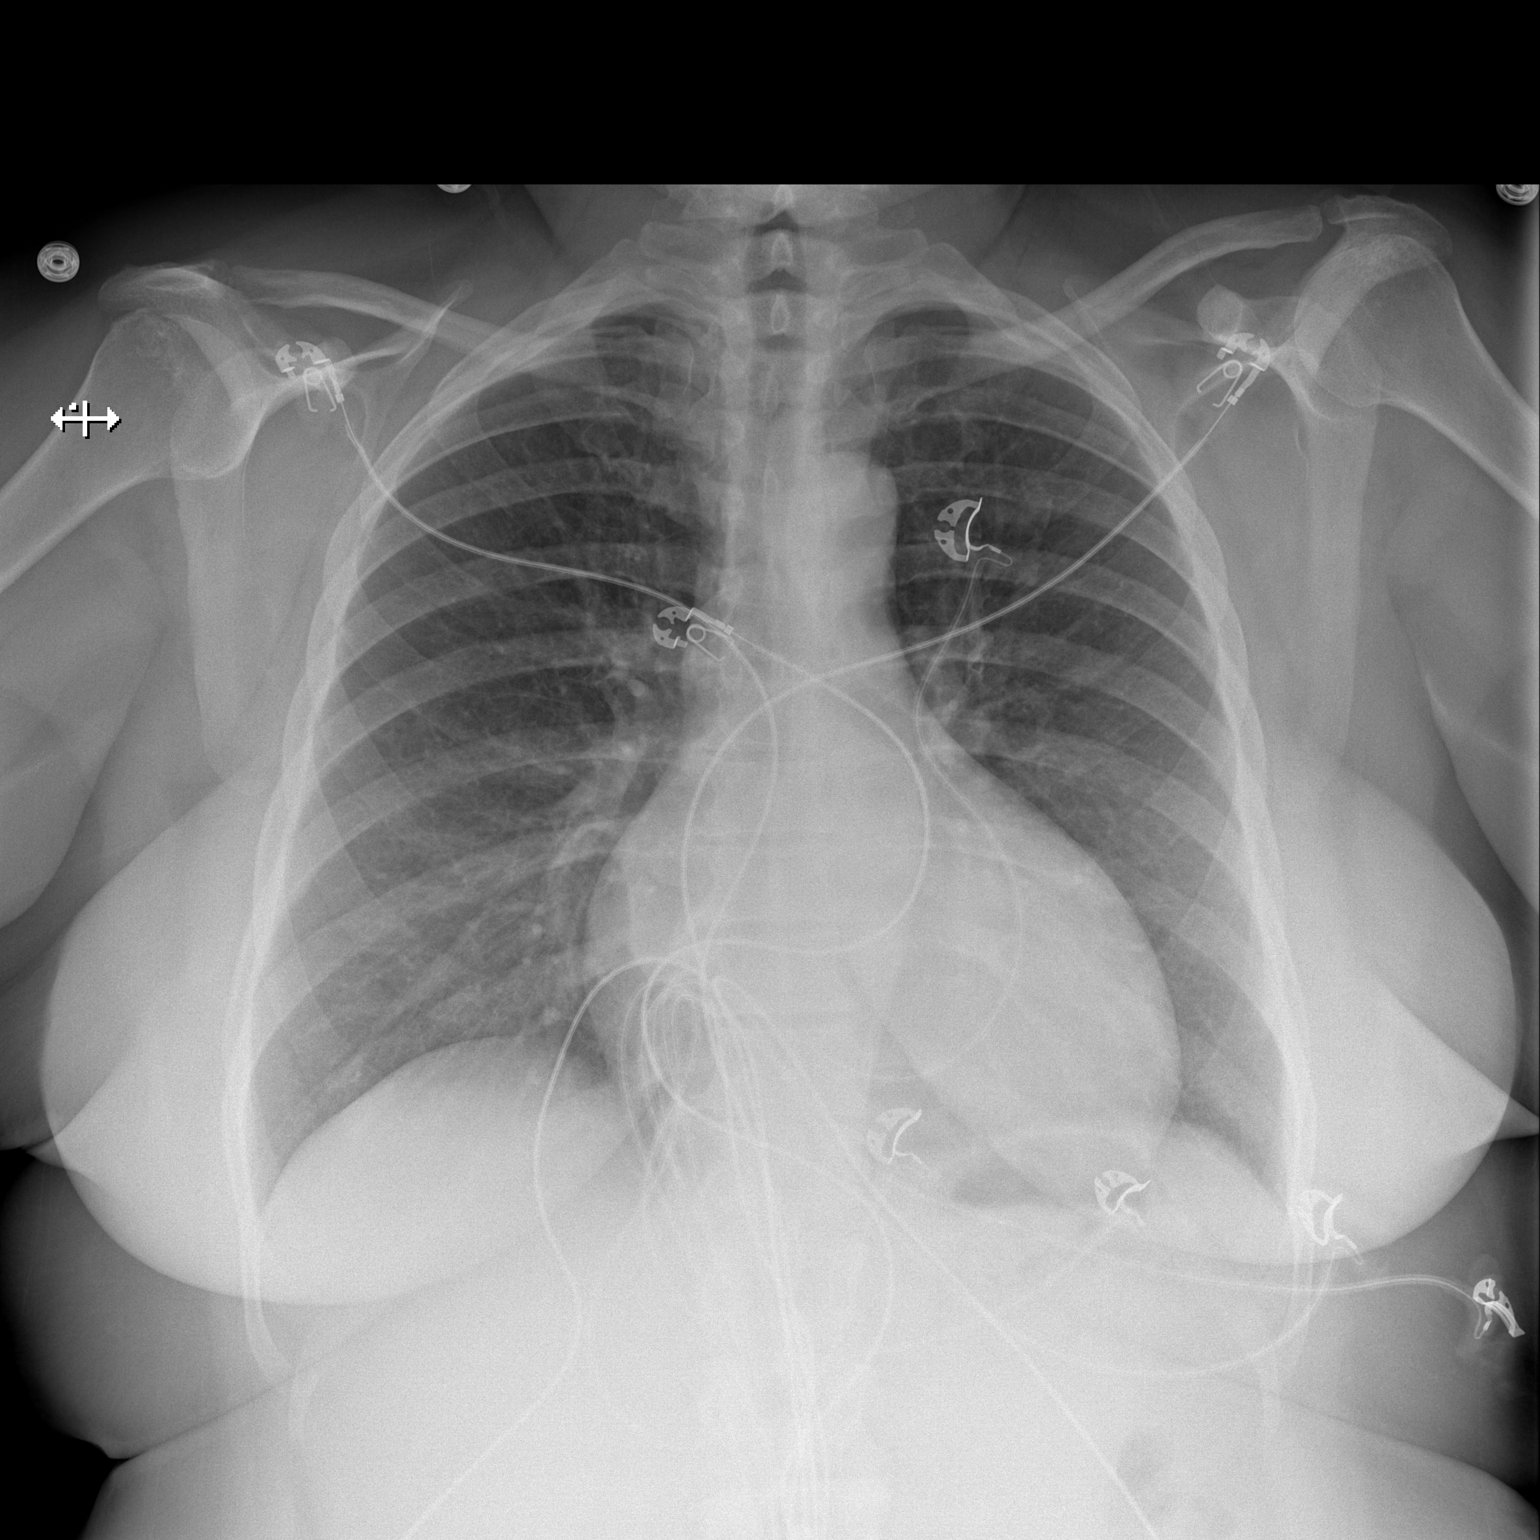

[w chest lat]
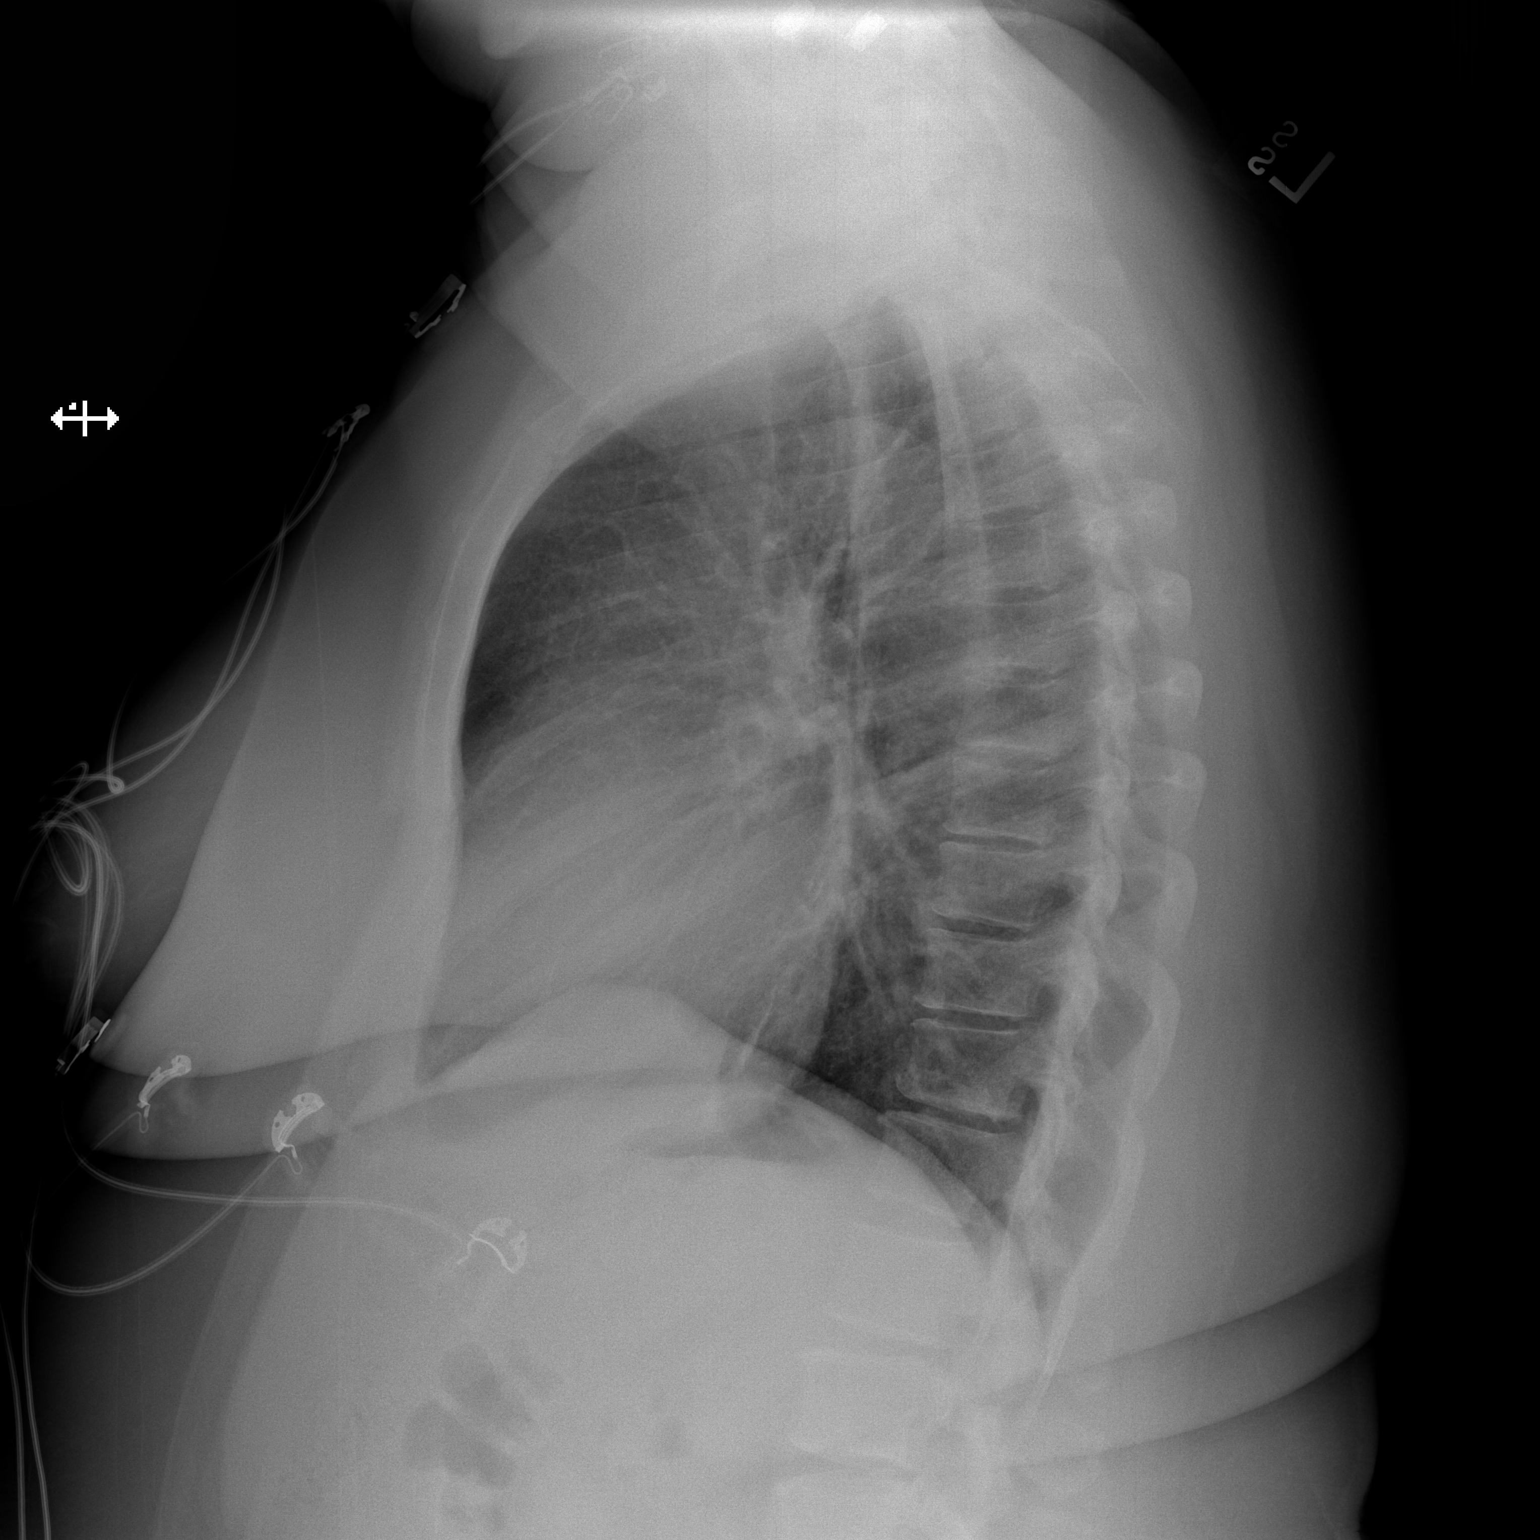

[2 of 2 positions shown; findings below may reference images not displayed]

FINDINGS: Stable cardiomediastinal silhouette with mild cardiomegaly. No
pneumothorax. No pleural effusion. Clear lungs, with no focal lung
consolidation and no pulmonary edema.
IMPRESSION: Stable mild cardiomegaly, with no pulmonary edema. No active
pulmonary disease.

## 2018-03-29 DIAGNOSIS — E559 Vitamin D deficiency, unspecified: Secondary | ICD-10-CM | POA: Insufficient documentation

## 2018-03-29 DIAGNOSIS — M5106 Intervertebral disc disorders with myelopathy, lumbar region: Secondary | ICD-10-CM | POA: Insufficient documentation

## 2018-04-28 DIAGNOSIS — G5701 Lesion of sciatic nerve, right lower limb: Secondary | ICD-10-CM | POA: Insufficient documentation

## 2018-08-18 DIAGNOSIS — M12811 Other specific arthropathies, not elsewhere classified, right shoulder: Secondary | ICD-10-CM | POA: Insufficient documentation

## 2019-10-28 DIAGNOSIS — R29818 Other symptoms and signs involving the nervous system: Secondary | ICD-10-CM | POA: Insufficient documentation

## 2019-10-28 DIAGNOSIS — M5416 Radiculopathy, lumbar region: Secondary | ICD-10-CM | POA: Insufficient documentation

## 2020-11-22 DIAGNOSIS — M4316 Spondylolisthesis, lumbar region: Secondary | ICD-10-CM | POA: Insufficient documentation

## 2021-12-16 DIAGNOSIS — F32A Depression, unspecified: Secondary | ICD-10-CM | POA: Insufficient documentation

## 2021-12-16 DIAGNOSIS — F419 Anxiety disorder, unspecified: Secondary | ICD-10-CM | POA: Insufficient documentation

## 2022-04-22 DIAGNOSIS — I161 Hypertensive emergency: Secondary | ICD-10-CM | POA: Insufficient documentation

## 2022-07-14 ENCOUNTER — Ambulatory Visit: Payer: Medicaid Other | Admitting: Cardiovascular Disease

## 2022-09-09 DIAGNOSIS — I469 Cardiac arrest, cause unspecified: Secondary | ICD-10-CM | POA: Insufficient documentation

## 2022-09-10 ENCOUNTER — Ambulatory Visit: Payer: Medicaid Other | Attending: Cardiovascular Disease | Admitting: Cardiovascular Disease

## 2022-09-10 ENCOUNTER — Encounter: Payer: Self-pay | Admitting: Cardiovascular Disease

## 2022-09-10 VITALS — BP 151/109 | HR 60 | Ht 61.0 in | Wt 182.4 lb

## 2022-09-10 DIAGNOSIS — Z7984 Long term (current) use of oral hypoglycemic drugs: Secondary | ICD-10-CM

## 2022-09-10 DIAGNOSIS — I428 Other cardiomyopathies: Secondary | ICD-10-CM

## 2022-09-10 DIAGNOSIS — I25111 Atherosclerotic heart disease of native coronary artery with angina pectoris with documented spasm: Secondary | ICD-10-CM

## 2022-09-10 DIAGNOSIS — I5042 Chronic combined systolic (congestive) and diastolic (congestive) heart failure: Secondary | ICD-10-CM

## 2022-09-10 DIAGNOSIS — Z95810 Presence of automatic (implantable) cardiac defibrillator: Secondary | ICD-10-CM

## 2022-09-10 DIAGNOSIS — N1831 Chronic kidney disease, stage 3a: Secondary | ICD-10-CM

## 2022-09-10 DIAGNOSIS — E7800 Pure hypercholesterolemia, unspecified: Secondary | ICD-10-CM

## 2022-09-10 DIAGNOSIS — I472 Ventricular tachycardia, unspecified: Secondary | ICD-10-CM | POA: Diagnosis not present

## 2022-09-10 DIAGNOSIS — I10 Essential (primary) hypertension: Secondary | ICD-10-CM

## 2022-09-10 DIAGNOSIS — Z5181 Encounter for therapeutic drug level monitoring: Secondary | ICD-10-CM

## 2022-09-10 DIAGNOSIS — Z79899 Other long term (current) drug therapy: Secondary | ICD-10-CM

## 2022-09-10 DIAGNOSIS — E118 Type 2 diabetes mellitus with unspecified complications: Secondary | ICD-10-CM

## 2022-09-10 MED ORDER — SACUBITRIL-VALSARTAN 49-51 MG PO TABS: 1.0000 | ORAL_TABLET | Freq: Two times a day (BID) | ORAL | 1 refills | Status: DC

## 2022-09-10 MED ORDER — ISOSORBIDE MONONITRATE ER 60 MG PO TB24: 60.0000 mg | ORAL_TABLET | Freq: Every day | ORAL | 3 refills | Status: DC

## 2022-09-10 MED ORDER — DAPAGLIFLOZIN PROPANEDIOL 10 MG PO TABS: 10.0000 mg | ORAL_TABLET | Freq: Every day | ORAL | 3 refills | Status: DC

## 2022-09-10 MED ORDER — AMIODARONE HCL 200 MG PO TABS: 200.0000 mg | ORAL_TABLET | Freq: Every day | ORAL | 3 refills | Status: AC

## 2022-09-10 MED ORDER — METOPROLOL SUCCINATE ER 50 MG PO TB24: 50.0000 mg | ORAL_TABLET | Freq: Every day | ORAL | 3 refills | Status: DC

## 2022-09-10 NOTE — Patient Instructions (Signed)
Medication Instructions:  Increase Metoprolol Succinate to 50 mg a day Increase Farxiga to 10 mg a day- before breakfast Start Entresto 49/51 twice a day *If you need a refill on your cardiac medications before your next appointment, please call your pharmacy*   Lab Work: CMET, HgA1C, proBNP, Lipid- Please return for Blood Work in 2 weeks (before appointment with Pharmacy). No appointment needed, lab here at the office is open Monday-Friday from 8AM to 4PM.   If you have labs (blood work) drawn today and your tests are completely normal, you will receive your results only by: MyChart Message (if you have MyChart) OR A paper copy in the mail If you have any lab test that is abnormal or we need to change your treatment, we will call you to review the results.   Testing/Procedures: Your physician has requested that you have an echocardiogram in  2 Months- before 3 month f/u with Dr Royann Shivers. Echocardiography is a painless test that uses sound waves to create images of your heart. It provides your doctor with information about the size and shape of your heart and how well your heart's chambers and valves are working. This procedure takes approximately one hour. There are no restrictions for this procedure. Please do NOT wear cologne, perfume, aftershave, or lotions (deodorant is allowed). Please arrive 15 minutes prior to your appointment time.    Follow-Up: At [facility], you and your health needs are our priority.  As part of our continuing mission to provide you with exceptional heart care, we have created designated Provider Care Teams.  These Care Teams include your primary Cardiologist (physician) and Advanced Practice Providers (APPs -  Physician Assistants and Nurse Practitioners) who all work together to provide you with the care you need, when you need it.  We recommend signing up for the patient portal called "MyChart".  Sign up information is provided on this After Visit  Summary.  MyChart is used to connect with patients for Virtual Visits (Telemedicine).  Patients are able to view lab/test results, encounter notes, upcoming appointments, etc.  Non-urgent messages can be sent to your provider as well.   To learn more about what you can do with MyChart, go to [email or web address].    Your next appointment:    2-3 weeks- Appointment with Pharm D  3 months Appointment with Dr Royann Shivers  Daily weights: Please notify us of weight increase of 2 lb in one day or 5 lb in one week

## 2022-09-10 NOTE — Progress Notes (Signed)
Cardiology Office Note:  .   Date:  09/10/2022  ID:  [name], DOB [birth year], MRN [id number] PCP: Corrin Parker, FNP  [facility] Providers Cardiologist:  None    History of Present Illness: [name]   [name] is a 57 y.o. 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  Had a presentation with episode of non-STEMI in 2013 with chest pain, estimated to be due to coronary spasm, as witnessed during angiography.  Her workup after the cardiac arrest and the implantation of the biventricular defibrillator was performed at [facility].  She has now moved to [facility] and is transferring care here.  She works in a nursing facility and the cardiac arrest occurred while she was at work and was therefore witnessed and promptly treated.  She is still emotional when recounting the events and started tearing up in the office today.  There is quite a bit of discrepancy between her list of medications in the computer and what she is actually taking:  - Both carvedilol and metoprolol succinate are listed.  She reports that she is no longer taking carvedilol.  She states that this was stopped as the same time as spironolactone - it sounds like 2 attempts were made to start on spironolactone, both times associated with worsening renal parameters -She is taking losartan 50 mg daily.  Lisinopril 20 mg daily is also  listed on her medication list, but she reports that she has not taken this in a long time. -She is only taking 5 mg of Farxiga rather than the typical 10 mg prescribed for heart failure. - She is still taking the 200 mg twice daily dose of amiodarone, ever since her initial presentation ventricular tachycardia 4 months ago.  The most recent liver function tests that I can find in the computer are from 05/23/2022 and were normal except for borderline elevation in ALT at 61 (an improvement from presentation without of hospital cardiac arrest when both AST and ALT were around 200).  TSH was mildly elevated at 5.02 05/20/2022 when she presented with cardiac arrest. -She is not taking nifedipine which is on her medication list -She is not taking isosorbide mononitrate which is also on her medication list   She has not had further syncope or any defibrillator shocks.  Interrogation of her device today shows that she has not had atrial or ventricular arrhythmias since device implantation.  She has 33% atrial pacing and 99% effective biventricular pacing.  All lead parameters were checked and they are within normal range.  The left ventricular lead is programmed with an output of 1.75 V at 0.5 ms in the D1-M2 configuration, LV first by 60 ms.  ECG today does not show clear evidence of effective resynchronization.  There may be pseudofusion with native conduction via relatively short PR interval.  There is no significant initial R wave in lead V1, but the QRS is relatively narrow at 132 ms (per the verbal description of the ECG in the electronic  medical record she had a LBBB with a QRS duration of 154 ms).  We did not make any changes today until we understand her situation a little better and preferably when we get EKG tracings performed before device implantation.    Most recent creatinine was 1.36 and potassium 3.9 on 06/05/2022.  BNP values prior to cardiac arrest 387, on the day of cardiac arrest  245.  She is taking a statin.  Most recent lipid profile from 05/20/2022 shows cholesterol 187, triglycerides 177, HDL 57, direct LDL 96.  Hemoglobin A1c was 6.3% on 06/05/2022.  The only medicine she takes for this is dapagliflozin 5 mg daily.  ROS: She denies orthopnea, PND, lower extremity edema currently, but she did have swollen legs and hands about 2 weeks ago.  This resolved spontaneously per her report, without taking extra diuretic.  Weight at home has been stable around 182 pounds.  Denies chest pain at rest or with activity.  Has not had dizziness, lightheadedness or syncope since the episode of cardiac arrest in February.  Denies claudication or focal neurological complaints.  No palpitations.  Studies Reviewed: .    Cardiac MRI 05/21/2022:  1.  LV is severely dilated with severe systolic dysfunction, with akinesis of the basal to mid inferolateral wall. LVEF 26 %  2.  RV normal size and systolic function.  3.  Left atrium is dilated.  4.  No significant valvular abnormalities.  5.  LGE: There is basal anterior midwall stripe and basal inferoseptal midwall stripe which can be seen in prior myocarditis. There is base to mid inferolateral and lateral subendocardial LGE 25-50% likely from prior infarct in left circumflex territory. No evidence of active edema or inflammation.   Cardiac catheterization 09/15/16: Widely patent, although atretic, coronaries  Cardiac catheterization 05/21/2022 [facility]):  Non-obstructive coronary artery disease.  Coronary Angiography Findings  --LM  Minimal luminal irregularities  --LAD  Minimal luminal irregularities  --LCx  Mid 40%  --RCA  Mid 50%  --AV crossed  LVEDP  17 mmHg   TTE 04/23/2022: SUMMARY Mildly dilated LV with severe LV systolic dysfunction, EF 20-25%, with global hypokinesis except basal inferoposterior akinesis. Mild MR. Left atrial enlargement. Compared to prior, LVEF reduced.  TTE 05/31/2021: SUMMARY Low normal LV systolic  function with EF50-55%. Basal inferoposterior hypokinesis. Diastolic dysfunction, stage 1. No significant valvular disease. Left atrial enlargement. No significant change from prior  NM myocardial perfusion stress test 05/31/2021: CONCLUSION: 1. No evidence of inducible ischemia. Favored attenuation artifact of the inferior wall. 2. Reduced left ventricular ejection fraction of 46%. 3. Mild global hypokinesia.  CRT-D implant 05/22/2022  Generator: Model Gallant HF  SN [id number]  RA lead: Model [id number]  SN [id number]  RV lead: Model 7122Q  SN [id number]  LV lead: Model 1456Q  SN [id number]   RA: Threshold 0.9 V @ 1.0 ms; Impedance 447 ohms; P wave 4.8  mV  RV: Threshold 0.5 V @ 0.5 ms; Impedance 622 ohms; R wave 19.2 mV  LV: Threshold 1.7 V @ 0.5 ms; Impedance 1202 ohms; R wave 10.4 mV  (D1-M2, LV first 60 ms  Mode: DDD, 60-130  VT Monitor 150 bpm  VT-2 171 bpm, ATP x 4, 36, 40 J x 2  VF 214 bpm, ATP x 1, 36, 40 J x 5    Risk Assessment/Calculations:     HYPERTENSION CONTROL Vitals:   09/10/22 0948 09/10/22 1000  BP: (!) 150/110 (!) 151/109    The patient's blood pressure is elevated above  target today.  In order to address the patient's elevated BP: A new medication was prescribed today.; A current anti-hypertensive medication was adjusted today.          Physical Exam:   VS:  BP (!) 151/109   Pulse 60   Ht 5\' 1"  (1.549 m)   Wt 182 lb 6.4 oz (82.7 kg)   SpO2 96%   BMI 34.46 kg/m    Wt Readings from Last 3 Encounters:  09/10/22 182 lb 6.4 oz (82.7 kg)  05/23/13 189 lb 9.5 oz (86 kg)  08/16/12 200 lb (90.7 kg)    GEN: Well nourished, well developed in no acute distress. Obese. NECK: No JVD; No carotid bruits CARDIAC: Healthy left subclavian defibrillator site, RRR, no murmurs, rubs, gallops RESPIRATORY:  Clear to auscultation without rales, wheezing or rhonchi  ABDOMEN: Soft, non-tender, non-distended EXTREMITIES:  No edema; No deformity   ASSESSMENT AND PLAN:  .   CHF: As far as I can tell if she is clinically euvolemic or close to that, her Corvue/thoracic impedance has been stable over the last couple of months, although she describes some swelling in her legs a couple of weeks ago.  NYHA functional class I-2.  She is able to work.  Many improvements can be made in her medication list.  It sounds that she is truly intolerant to spironolactone, but she is on a tiny dose of beta-blocker (will increase the metoprolol succinate to 50 mg daily today) and an inadequate dose of dapagliflozin (will increase this to 10 mg daily).  Would also benefit from switching from [facility] to losartan.  She has not been on ACE inhibitor's for a long time.  Start Entresto 49-51 mg twice daily. Try to limit NSAID use. VT: She had cardiac arrest due to monomorphic VT, likely related to areas of scar that we have seen on her MRI.  On amiodarone she has not had any ventricular arrhythmia over the last 4 months.   Amiodarone: Reduce amiodarone to 200 mg once daily. Due to have repeat liver function tests, TSH.  We discussed the need to promptly report unexplained respiratory symptoms.  We reviewed the possible multiple drug interactions for amiodarone. CMP: Likely a complex, multifactorial problem.  For years she was thought to have hypertensive cardiomyopathy.  She clearly had an NSTEMI despite normal coronary arteries in 2013 and there is an inferolateral subendocardial scar that may be the result of that event.  In addition she has mid wall scar that looks more like the consequence of myocarditis.  She does not have other clinical features to suggest sarcoidosis (no skin rashes, no lymphadenopathy, etc.).  Need to keep this possibility in mind and may need an FDG PET scan in the future. CRT-D: "99%" BiV pacing, but equivocal evidence that CRT is making a difference. QRS is narrower, but there is no R in V1 and the ECG may be showing pseudofusion. Adjusted LV lead output today. CAD:  Relatively minor nonobstructive disease, but at risk for progression over time due to her multiple coronary risk factors.  She is on a statin, but target LDL has not been achieved.  Needs good glycemic control.  Blood pressure remains unsatisfactory be elevated.  She does not smoke.  It is quite possible that coronary vasospasm was the cause for her infarction in 2013, since vasospasm was seen during angiography.  I think she should restart on the long-acting nitrates that were prescribed, but she is no longer taking. HTN: Severely elevated blood  pressure, in particular severe diastolic hypertension.  We are switching from losartan to [facility] and increasing her dose of metoprolol succinate.  I would not give up on the idea of getting her back on carvedilol if this was truly stopped because of renal dysfunction.  Target BP less than 130/80. CKD3a: It seems that her creatinine baseline is around 1.3.  Will need to monitor this when we switch to [facility].  Per her report and looking through the labs there were 2 unsuccessful attempts to start spironolactone. DM: Fair glycemic control with a hemoglobin A1c of 6.3% 06/05/2022.  Increasing dapagliflozin dose to 10 mg daily for heart failure benefit. HLP: Even though her cardiomyopathy is probably primarily nonischemic, important to keep LDL <70. Recheck labs.  Will recheck echo and plan to titrate at a visit with PharmD after we recheck BMET.  At next appt will see if we can optimize CRT timing.       Dispo:  Patient Instructions  Medication Instructions:  Increase Metoprolol Succinate to 50 mg a day Increase Farxiga to 10 mg a day- before breakfast Start Entresto 49/51 twice a day *If you need a refill on your cardiac medications before your next appointment, please call your pharmacy*   Lab Work: CMET, HgA1C, proBNP, Lipid- Please return for Blood Work in 2 weeks (before appointment with Pharmacy). No appointment needed, lab here at the office is  open Monday-Friday from 8AM to 4PM.   If you have labs (blood work) drawn today and your tests are completely normal, you will receive your results only by: MyChart Message (if you have MyChart) OR A paper copy in the mail If you have any lab test that is abnormal or we need to change your treatment, we will call you to review the results.   Testing/Procedures: Your physician has requested that you have an echocardiogram in  2 Months- before 3 month f/u with Dr Royann Shivers. Echocardiography is a painless test that uses sound waves to create images of your heart. It provides your doctor with information about the size and shape of your heart and how well your heart's chambers and valves are working. This procedure takes approximately one hour. There are no restrictions for this procedure. Please do NOT wear cologne, perfume, aftershave, or lotions (deodorant is allowed). Please arrive 15 minutes prior to your appointment time.    Follow-Up: At [facility], you and your health needs are our priority.  As part of our continuing mission to provide you with exceptional heart care, we have created designated Provider Care Teams.  These Care Teams include your primary Cardiologist (physician) and Advanced Practice Providers (APPs -  Physician Assistants and Nurse Practitioners) who all work together to provide you with the care you need, when you need it.  We recommend signing up for the patient portal called "MyChart".  Sign up information is provided on this After Visit Summary.  MyChart is used to connect with patients for Virtual Visits (Telemedicine).  Patients are able to view lab/test results, encounter notes, upcoming appointments, etc.  Non-urgent messages can be sent to your provider as well.   To learn more about what you can do with MyChart, go to [email or web address].    Your next appointment:    2-3 weeks- Appointment with Pharm D  3 months Appointment with Dr Royann Shivers  Daily  weights: Please notify us of weight increase of 2 lb in one day or 5 lb in one week   Signed, Thurmon Fair, MD

## 2022-09-11 ENCOUNTER — Telehealth: Payer: Self-pay | Admitting: Cardiovascular Disease

## 2022-09-11 NOTE — Telephone Encounter (Signed)
Spoke with pharmacist Adrea and she is aware provider wants both amiodarone and metoprolol filled

## 2022-09-11 NOTE — Telephone Encounter (Signed)
Pt c/o medication issue:  1. Name of Medication:   amiodarone (PACERONE) 200 MG tablet   metoprolol succinate (TOPROL-XL) 50 MG 24 hr tablet  2. How are you currently taking this medication (dosage and times per day)?   3. Are you having a reaction (difficulty breathing--STAT)? No  4. What is your medication issue? Pharmacy would like to know if provider wants pt to take above medications due to them interacting with each other. Please advise.

## 2022-09-11 NOTE — Telephone Encounter (Signed)
Pharmacy holding refill of metoprolol due to interaction between amiodarone and metoprolol.  Drug interaction severe bradycardia,hypotension, cardiac attack.  Please advise

## 2022-10-02 ENCOUNTER — Ambulatory Visit: Payer: Medicaid Other | Attending: Cardiovascular Disease

## 2022-10-02 NOTE — Progress Notes (Deleted)
Patient ID: [name]                 DOB: 05-09-1965                      MRN: [id number]     HPI: [name] is a 57 y.o. female referred by Dr. Royann Shivers to pharmacy clinic for HF medication management. PMH is significant for ***. Most recent LVEF *** on ***.  Discussion with patient today included the following: cardiac medication indications, review of GDMT including reasoning behind medication titration, importance of medication adherence, and patient engagement. ***Denies dizziness, fatigue, LE edema, or SOB. Able to complete all ADLs. *** checks daily weights at home. *** adheres to a low-sodium diet.  Current CHF meds:  Previously tried:  BP goal:   Family History:   Social History:   Diet:   Exercise:   Home BP readings:   Wt Readings from Last 3 Encounters:  09/10/22 182 lb 6.4 oz (82.7 kg)  05/23/13 189 lb 9.5 oz (86 kg)  08/16/12 200 lb (90.7 kg)   BP Readings from Last 3 Encounters:  09/10/22 (!) 151/109  03/09/15 (!) 154/117  01/26/15 (!) 178/114   Pulse Readings from Last 3 Encounters:  09/10/22 60  03/09/15 68  01/26/15 68    Renal function: CrCl cannot be calculated (Patient's most recent lab result is older than the maximum 21 days allowed.).  Past Medical History:  Diagnosis Date   Abnormal TSH    10/2011 - additional hormones pending   Anxiety    Asthma    Atypical chest pain    recent low risk myoview   Coronary artery spasm (HCC)    a. NSTEMI 10/2011 with no CAD by cath but evidence of spasm when injecting left coronary artery.    Hyperlipidemia    Hypertension    LBBB (left bundle branch block)    Noncompliance    SVT (supraventricular tachycardia) 10/22/10   wide complex tachycardia of same QRS as intinsic LBBB    Current Outpatient Medications on File Prior to Visit  Medication Sig Dispense Refill   acetaminophen (TYLENOL) 500 MG tablet Take 500 mg by mouth as needed. (Patient not taking: Reported on 09/10/2022)      albuterol (PROVENTIL HFA;VENTOLIN HFA) 108 (90 BASE) MCG/ACT inhaler Inhale 2 puffs into the lungs every 6 (six) hours as needed. For wheezing     amiodarone (PACERONE) 200 MG tablet Take 1 tablet (200 mg total) by mouth daily. 90 tablet 3   aspirin EC 81 MG tablet Take 81 mg by mouth daily.     atorvastatin (LIPITOR) 10 MG tablet Take 1 tablet (10 mg total) by mouth daily. 30 tablet 0   cephALEXin (KEFLEX) 500 MG capsule Take 500 mg by mouth 2 (two) times daily.     dapagliflozin propanediol (FARXIGA) 10 MG TABS tablet Take 1 tablet (10 mg total) by mouth daily before breakfast. 90 tablet 3   furosemide (LASIX) 20 MG tablet Take 20 mg by mouth daily.     gabapentin (NEURONTIN) 600 MG tablet Take 600 mg by mouth 3 (three) times daily.     ibuprofen (ADVIL,MOTRIN) 600 MG tablet Take 1 tablet (600 mg total) by mouth every 6 (six) hours as needed. 30 tablet 0   Iron 66 MG TABS Take 65 mg by mouth every morning.      isosorbide mononitrate (IMDUR) 60 MG 24 hr tablet Take 1 tablet (60 mg  total) by mouth daily. 90 tablet 3   metoprolol succinate (TOPROL-XL) 50 MG 24 hr tablet Take 1 tablet (50 mg total) by mouth daily. Take with or immediately following a meal. 90 tablet 3   naphazoline-glycerin (CLEAR EYES) 0.012-0.2 % SOLN Place 1-2 drops into both eyes every 4 (four) hours as needed. For itchy eyes     nitroGLYCERIN (NITROSTAT) 0.4 MG SL tablet Place 0.4 mg under the tongue every 5 (five) minutes as needed for chest pain. (Patient not taking: Reported on 09/10/2022)     sacubitril-valsartan (ENTRESTO) 49-51 MG Take 1 tablet by mouth 2 (two) times daily. 60 tablet 1   venlafaxine (EFFEXOR) 25 MG tablet Take 25 mg by mouth daily in the afternoon.     No current facility-administered medications on file prior to visit.    Allergies  Allergen Reactions   Penicillins Hives and Itching    Has patient had a PCN reaction causing immediate rash, facial/tongue/throat swelling, SOB or lightheadedness with  hypotension: no Has patient had a PCN reaction causing severe rash involving mucus membranes or skin necrosis: no Has patient had a PCN reaction that required hospitalization no Has patient had a PCN reaction occurring within the last 10 years: no If all of the above answers are "NO", then may proceed with Cephalosporin use.    Shellfish Allergy Other (See Comments)    "It makes my tongue swell and itch but I eat it anyway". She usually takes a Benadryl before eating it.     Assessment/Plan:  1. CHF -

## 2022-10-28 ENCOUNTER — Ambulatory Visit (HOSPITAL_COMMUNITY): Payer: Medicaid Other | Attending: Cardiovascular Disease

## 2022-10-28 DIAGNOSIS — I5042 Chronic combined systolic (congestive) and diastolic (congestive) heart failure: Secondary | ICD-10-CM | POA: Insufficient documentation

## 2022-10-28 LAB — ECHOCARDIOGRAM COMPLETE
Area-P 1/2: 2.12 cm2
S' Lateral: 5.4 cm

## 2022-12-12 ENCOUNTER — Ambulatory Visit (INDEPENDENT_AMBULATORY_CARE_PROVIDER_SITE_OTHER): Payer: No Typology Code available for payment source

## 2022-12-12 DIAGNOSIS — I428 Other cardiomyopathies: Secondary | ICD-10-CM

## 2022-12-12 LAB — CUP PACEART REMOTE DEVICE CHECK
Battery Remaining Longevity: 83 mo
Battery Remaining Percentage: 90 %
Battery Voltage: 3.01 V
Brady Statistic AP VP Percent: 33 %
Brady Statistic AP VS Percent: 1 %
Brady Statistic AS VP Percent: 66 %
Brady Statistic AS VS Percent: 1 %
Brady Statistic RA Percent Paced: 32 %
Date Time Interrogation Session: 20240919220205
HighPow Impedance: 63 Ohm
Implantable Lead Connection Status: 753985
Implantable Lead Connection Status: 753985
Implantable Lead Implant Date: 20240229
Implantable Lead Implant Date: 20240229
Implantable Lead Location: 753858
Implantable Lead Location: 753860
Implantable Pulse Generator Implant Date: 20240229
Lead Channel Impedance Value: 350 Ohm
Lead Channel Impedance Value: 480 Ohm
Lead Channel Impedance Value: 950 Ohm
Lead Channel Pacing Threshold Amplitude: 0.75 V
Lead Channel Pacing Threshold Amplitude: 1 V
Lead Channel Pacing Threshold Amplitude: 1.25 V
Lead Channel Pacing Threshold Pulse Width: 0.5 ms
Lead Channel Pacing Threshold Pulse Width: 0.5 ms
Lead Channel Pacing Threshold Pulse Width: 0.5 ms
Lead Channel Sensing Intrinsic Amplitude: 12 mV
Lead Channel Sensing Intrinsic Amplitude: 5 mV
Lead Channel Setting Pacing Amplitude: 2 V
Lead Channel Setting Pacing Amplitude: 2.25 V
Lead Channel Setting Pacing Amplitude: 2.5 V
Lead Channel Setting Pacing Pulse Width: 0.5 ms
Lead Channel Setting Pacing Pulse Width: 0.5 ms
Lead Channel Setting Sensing Sensitivity: 0.5 mV
Pulse Gen Serial Number: 211015531
Zone Setting Status: 755011

## 2022-12-22 NOTE — Progress Notes (Signed)
Remote ICD transmission.   

## 2023-01-07 ENCOUNTER — Observation Stay (HOSPITAL_COMMUNITY)
Admission: EM | Admit: 2023-01-07 | Discharge: 2023-01-09 | Disposition: A | Discharge status: Home / Self Care | Payer: Medicaid Other | Attending: Emergency Medicine | Admitting: Emergency Medicine

## 2023-01-07 ENCOUNTER — Other Ambulatory Visit: Payer: Self-pay

## 2023-01-07 ENCOUNTER — Encounter (HOSPITAL_COMMUNITY): Payer: Self-pay | Admitting: *Deleted

## 2023-01-07 ENCOUNTER — Emergency Department (HOSPITAL_COMMUNITY): Payer: Medicaid Other

## 2023-01-07 DIAGNOSIS — I5042 Chronic combined systolic (congestive) and diastolic (congestive) heart failure: Secondary | ICD-10-CM | POA: Diagnosis present

## 2023-01-07 DIAGNOSIS — F17200 Nicotine dependence, unspecified, uncomplicated: Secondary | ICD-10-CM | POA: Diagnosis not present

## 2023-01-07 DIAGNOSIS — I25110 Atherosclerotic heart disease of native coronary artery with unstable angina pectoris: Principal | ICD-10-CM | POA: Insufficient documentation

## 2023-01-07 DIAGNOSIS — I200 Unstable angina: Secondary | ICD-10-CM | POA: Diagnosis not present

## 2023-01-07 DIAGNOSIS — N1831 Chronic kidney disease, stage 3a: Secondary | ICD-10-CM | POA: Insufficient documentation

## 2023-01-07 DIAGNOSIS — I10 Essential (primary) hypertension: Secondary | ICD-10-CM | POA: Diagnosis present

## 2023-01-07 DIAGNOSIS — E7800 Pure hypercholesterolemia, unspecified: Secondary | ICD-10-CM | POA: Diagnosis present

## 2023-01-07 DIAGNOSIS — Z79899 Other long term (current) drug therapy: Secondary | ICD-10-CM | POA: Diagnosis not present

## 2023-01-07 DIAGNOSIS — J45909 Unspecified asthma, uncomplicated: Secondary | ICD-10-CM | POA: Insufficient documentation

## 2023-01-07 DIAGNOSIS — E1122 Type 2 diabetes mellitus with diabetic chronic kidney disease: Secondary | ICD-10-CM | POA: Diagnosis not present

## 2023-01-07 DIAGNOSIS — I130 Hypertensive heart and chronic kidney disease with heart failure and stage 1 through stage 4 chronic kidney disease, or unspecified chronic kidney disease: Secondary | ICD-10-CM | POA: Insufficient documentation

## 2023-01-07 DIAGNOSIS — Z7982 Long term (current) use of aspirin: Secondary | ICD-10-CM | POA: Insufficient documentation

## 2023-01-07 DIAGNOSIS — Z23 Encounter for immunization: Secondary | ICD-10-CM | POA: Insufficient documentation

## 2023-01-07 DIAGNOSIS — R079 Chest pain, unspecified: Secondary | ICD-10-CM | POA: Diagnosis present

## 2023-01-07 DIAGNOSIS — Z95810 Presence of automatic (implantable) cardiac defibrillator: Secondary | ICD-10-CM | POA: Diagnosis present

## 2023-01-07 LAB — CBC
HCT: 47.6 % — ABNORMAL HIGH (ref 36.0–46.0)
Hemoglobin: 14.7 g/dL (ref 12.0–15.0)
MCH: 27.6 pg (ref 26.0–34.0)
MCHC: 30.9 g/dL (ref 30.0–36.0)
MCV: 89.5 fL (ref 80.0–100.0)
Platelets: 189 10*3/uL (ref 150–400)
RBC: 5.32 MIL/uL — ABNORMAL HIGH (ref 3.87–5.11)
RDW: 16.1 % — ABNORMAL HIGH (ref 11.5–15.5)
WBC: 7.8 10*3/uL (ref 4.0–10.5)
nRBC: 0 % (ref 0.0–0.2)

## 2023-01-07 LAB — BASIC METABOLIC PANEL
Anion gap: 12 (ref 5–15)
BUN: 26 mg/dL — ABNORMAL HIGH (ref 6–20)
CO2: 22 mmol/L (ref 22–32)
Calcium: 9.7 mg/dL (ref 8.9–10.3)
Chloride: 110 mmol/L (ref 98–111)
Creatinine, Ser: 1.08 mg/dL — ABNORMAL HIGH (ref 0.44–1.00)
GFR, Estimated: 60 mL/min — ABNORMAL LOW (ref >60)
Glucose, Bld: 98 mg/dL (ref 70–99)
Potassium: 3.8 mmol/L (ref 3.5–5.1)
Sodium: 144 mmol/L (ref 135–145)

## 2023-01-07 LAB — TROPONIN I (HIGH SENSITIVITY)
Troponin I (High Sensitivity): 23 ng/L — ABNORMAL HIGH (ref <18)
Troponin I (High Sensitivity): 26 ng/L — ABNORMAL HIGH

## 2023-01-07 LAB — BRAIN NATRIURETIC PEPTIDE: B Natriuretic Peptide: 112.2 pg/mL — ABNORMAL HIGH (ref 0.0–100.0)

## 2023-01-07 LAB — HCG, SERUM, QUALITATIVE: Preg, Serum: NEGATIVE

## 2023-01-07 MED ORDER — ASPIRIN 81 MG PO CHEW
324.0000 mg | CHEWABLE_TABLET | Freq: Once | ORAL | Status: AC
  Administered 2023-01-07: 324 mg via ORAL
  Filled 2023-01-07: qty 4

## 2023-01-07 MED ORDER — NITROGLYCERIN IN D5W 200-5 MCG/ML-% IV SOLN
0.0000 ug/min | INTRAVENOUS | Status: DC
  Administered 2023-01-07: 5 ug/min via INTRAVENOUS
  Administered 2023-01-08: 50 ug/min via INTRAVENOUS
  Filled 2023-01-07 (×2): qty 250

## 2023-01-07 MED ORDER — INFLUENZA VIRUS VACC SPLIT PF (FLUZONE) 0.5 ML IM SUSY
0.5000 mL | PREFILLED_SYRINGE | INTRAMUSCULAR | Status: AC
  Administered 2023-01-09: 0.5 mL via INTRAMUSCULAR
  Filled 2023-01-07: qty 0.5

## 2023-01-07 MED ORDER — ATORVASTATIN CALCIUM 10 MG PO TABS
10.0000 mg | ORAL_TABLET | Freq: Every day | ORAL | Status: DC
  Filled 2023-01-07: qty 1

## 2023-01-07 MED ORDER — METOPROLOL SUCCINATE ER 50 MG PO TB24
50.0000 mg | ORAL_TABLET | Freq: Every day | ORAL | Status: DC
  Administered 2023-01-08: 50 mg via ORAL
  Filled 2023-01-07: qty 1

## 2023-01-07 MED ORDER — FERROUS SULFATE 325 (65 FE) MG PO TABS
325.0000 mg | ORAL_TABLET | Freq: Every morning | ORAL | Status: DC
  Administered 2023-01-09: 325 mg via ORAL
  Filled 2023-01-07 (×2): qty 1

## 2023-01-07 MED ORDER — SACUBITRIL-VALSARTAN 49-51 MG PO TABS
1.0000 | ORAL_TABLET | Freq: Two times a day (BID) | ORAL | Status: DC
  Filled 2023-01-07: qty 1

## 2023-01-07 MED ORDER — AMIODARONE HCL 200 MG PO TABS
200.0000 mg | ORAL_TABLET | Freq: Every day | ORAL | Status: DC
  Administered 2023-01-08 – 2023-01-09 (×2): 200 mg via ORAL
  Filled 2023-01-07 (×2): qty 1

## 2023-01-07 MED ORDER — DAPAGLIFLOZIN PROPANEDIOL 10 MG PO TABS
10.0000 mg | ORAL_TABLET | Freq: Every day | ORAL | Status: DC
  Administered 2023-01-08 – 2023-01-09 (×2): 10 mg via ORAL
  Filled 2023-01-07 (×2): qty 1

## 2023-01-07 MED ORDER — FUROSEMIDE 20 MG PO TABS
20.0000 mg | ORAL_TABLET | Freq: Every day | ORAL | Status: DC
  Administered 2023-01-08 – 2023-01-09 (×2): 20 mg via ORAL
  Filled 2023-01-07 (×2): qty 1

## 2023-01-07 MED ORDER — NITROGLYCERIN 0.4 MG SL SUBL
0.4000 mg | SUBLINGUAL_TABLET | SUBLINGUAL | Status: DC | PRN
  Administered 2023-01-07 (×2): 0.4 mg via SUBLINGUAL
  Filled 2023-01-07: qty 1

## 2023-01-07 MED ORDER — ENOXAPARIN SODIUM 80 MG/0.8ML IJ SOSY
80.0000 mg | PREFILLED_SYRINGE | Freq: Two times a day (BID) | INTRAMUSCULAR | Status: DC
  Administered 2023-01-07 – 2023-01-09 (×3): 80 mg via SUBCUTANEOUS
  Filled 2023-01-07 (×5): qty 0.8

## 2023-01-07 MED ORDER — ONDANSETRON HCL 4 MG/2ML IJ SOLN: 4.0000 mg | Freq: Four times a day (QID) | INTRAMUSCULAR | Status: DC | PRN

## 2023-01-07 MED ORDER — GABAPENTIN 300 MG PO CAPS
600.0000 mg | ORAL_CAPSULE | Freq: Three times a day (TID) | ORAL | Status: DC
  Administered 2023-01-07 – 2023-01-09 (×4): 600 mg via ORAL
  Filled 2023-01-07 (×4): qty 2

## 2023-01-07 MED ORDER — ACETAMINOPHEN 325 MG PO TABS
650.0000 mg | ORAL_TABLET | ORAL | Status: DC | PRN
  Administered 2023-01-07 – 2023-01-08 (×2): 650 mg via ORAL
  Filled 2023-01-07 (×2): qty 2

## 2023-01-07 MED ORDER — VENLAFAXINE HCL 25 MG PO TABS
25.0000 mg | ORAL_TABLET | Freq: Every day | ORAL | Status: DC
  Filled 2023-01-07 (×2): qty 1

## 2023-01-07 MED ORDER — ALBUTEROL SULFATE (2.5 MG/3ML) 0.083% IN NEBU: 3.0000 mL | INHALATION_SOLUTION | Freq: Four times a day (QID) | RESPIRATORY_TRACT | Status: DC | PRN

## 2023-01-07 MED ORDER — SACUBITRIL-VALSARTAN 49-51 MG PO TABS
1.0000 | ORAL_TABLET | Freq: Every day | ORAL | Status: DC
  Administered 2023-01-08 – 2023-01-09 (×2): 1 via ORAL
  Filled 2023-01-07 (×2): qty 1

## 2023-01-07 MED ORDER — ASPIRIN 81 MG PO TBEC
81.0000 mg | DELAYED_RELEASE_TABLET | Freq: Every day | ORAL | Status: DC
  Administered 2023-01-08 – 2023-01-09 (×2): 81 mg via ORAL
  Filled 2023-01-07 (×2): qty 1

## 2023-01-07 MED ORDER — ISOSORBIDE MONONITRATE ER 60 MG PO TB24
60.0000 mg | ORAL_TABLET | Freq: Every day | ORAL | Status: DC
  Filled 2023-01-07: qty 1

## 2023-01-07 NOTE — ED Provider Notes (Signed)
[place] EMERGENCY DEPARTMENT AT [facility] Provider Note   CSN: [id number] Arrival date & time: 01/07/23  1534     History  Chief Complaint  Patient presents with   Chest Pain    [name] is a 57 y.o. female.  HPI 57 year old female with a history of hypertension, hyperlipidemia, coronary artery spasm, CHF, biventricular ICD placement a few months ago, presents with chest pain.  She states around 2 PM she was resting and all of a sudden developed heaviness to her chest.  It has been coming and going since it started.  Nothing specific makes it worse though later she tells me that walking would induce it.  She has never had this type of pain before.  The shortness of breath is primarily whenever the chest discomfort happens which seems to come and go.  The first time it happened she did develop sweating but not on subsequent episodes.  No nausea, radiation of the pain, back pain, abdominal pain. No recent leg swelling.  Patient is noted to be pretty hypertensive but states she has been compliant with her meds and has not had no recent change in them.  Home Medications Prior to Admission medications   Medication Sig Start Date End Date Taking? Authorizing Provider  acetaminophen (TYLENOL) 500 MG tablet Take 500 mg by mouth as needed. Patient not taking: Reported on 09/10/2022    [provider]  albuterol (PROVENTIL HFA;VENTOLIN HFA) 108 (90 BASE) MCG/ACT inhaler Inhale 2 puffs into the lungs every 6 (six) hours as needed. For wheezing    [provider]  amiodarone (PACERONE) 200 MG tablet Take 1 tablet (200 mg total) by mouth daily. 09/10/22   Croitoru, Mihai, MD  aspirin EC 81 MG tablet Take 81 mg by mouth daily. 04/22/22   [provider]  atorvastatin (LIPITOR) 10 MG tablet Take 1 tablet (10 mg total) by mouth daily. 03/09/15   Ward, Chase Picket, PA-C  cephALEXin (KEFLEX) 500 MG capsule Take 500 mg by mouth 2 (two) times daily.  05/23/22   [provider]  dapagliflozin propanediol (FARXIGA) 10 MG TABS tablet Take 1 tablet (10 mg total) by mouth daily before breakfast. 09/10/22   Croitoru, Mihai, MD  furosemide (LASIX) 20 MG tablet Take 20 mg by mouth daily.    [provider]  gabapentin (NEURONTIN) 600 MG tablet Take 600 mg by mouth 3 (three) times daily.    [provider]  ibuprofen (ADVIL,MOTRIN) 600 MG tablet Take 1 tablet (600 mg total) by mouth every 6 (six) hours as needed. 05/29/13   Nadara Mustard, MD  Iron 66 MG TABS Take 65 mg by mouth every morning.     [provider]  isosorbide mononitrate (IMDUR) 60 MG 24 hr tablet Take 1 tablet (60 mg total) by mouth daily. 09/10/22   Croitoru, Mihai, MD  metoprolol succinate (TOPROL-XL) 50 MG 24 hr tablet Take 1 tablet (50 mg total) by mouth daily. Take with or immediately following a meal. 09/10/22   Croitoru, Mihai, MD  naphazoline-glycerin (CLEAR EYES) 0.012-0.2 % SOLN Place 1-2 drops into both eyes every 4 (four) hours as needed. For itchy eyes    [provider]  nitroGLYCERIN (NITROSTAT) 0.4 MG SL tablet Place 0.4 mg under the tongue every 5 (five) minutes as needed for chest pain.    [provider]  sacubitril-valsartan (ENTRESTO) 49-51 MG Take 1 tablet by mouth 2 (two) times daily. 09/10/22   Croitoru, Rachelle Hora, MD  venlafaxine (  EFFEXOR) 25 MG tablet Take 25 mg by mouth daily in the afternoon. 08/24/20   [provider]      Allergies    Amoxicillin, Penicillins, and Shellfish allergy    Review of Systems   Review of Systems  Constitutional:  Positive for diaphoresis. Negative for fever.  Respiratory:  Positive for shortness of breath. Negative for cough.   Cardiovascular:  Positive for chest pain. Negative for leg swelling.  Gastrointestinal:  Negative for abdominal pain and vomiting.  Musculoskeletal:  Negative for back pain.    Physical Exam Updated Vital Signs BP (!) 155/108 (BP Location: Right  Arm)   Pulse 65   Temp 98 F (36.7 C) (Oral)   Resp 14   Ht 5\' 1"  (1.549 m)   Wt 82.6 kg   SpO2 99%   BMI 34.39 kg/m  Physical Exam Vitals and nursing note reviewed.  Constitutional:      Appearance: She is well-developed. She is obese.  HENT:     Head: Normocephalic and atraumatic.  Cardiovascular:     Rate and Rhythm: Normal rate and regular rhythm.     Pulses:          Radial pulses are 2+ on the left side.     Heart sounds: Normal heart sounds.  Pulmonary:     Effort: Pulmonary effort is normal.     Breath sounds: Normal breath sounds.  Chest:     Chest wall: No tenderness (no tenderness when patient palpates her own chest).  Abdominal:     Palpations: Abdomen is soft.     Tenderness: There is no abdominal tenderness.  Musculoskeletal:     Right lower leg: No edema.     Left lower leg: No edema.  Skin:    General: Skin is warm and dry.  Neurological:     Mental Status: She is alert.     ED Results / Procedures / Treatments   Labs (all labs ordered are listed, but only abnormal results are displayed) Labs Reviewed  BASIC METABOLIC PANEL - Abnormal; Notable for the following components:      Result Value   BUN 26 (*)    Creatinine, Ser 1.08 (*)    GFR, Estimated 60 (*)    All other components within normal limits  CBC - Abnormal; Notable for the following components:   RBC 5.32 (*)    HCT 47.6 (*)    RDW 16.1 (*)    All other components within normal limits  BRAIN NATRIURETIC PEPTIDE - Abnormal; Notable for the following components:   B Natriuretic Peptide 112.2 (*)    All other components within normal limits  TROPONIN I (HIGH SENSITIVITY) - Abnormal; Notable for the following components:   Troponin I (High Sensitivity) 23 (*)    All other components within normal limits  HCG, SERUM, QUALITATIVE  CBC  TROPONIN I (HIGH SENSITIVITY)    EKG EKG Interpretation Date/Time:  Wednesday January 07 2023 17:32:50 EDT Ventricular Rate:  63 PR  Interval:  53 QRS Duration:  177 QT Interval:  522 QTC Calculation: 539 R Axis:   -26  Text Interpretation: Sinus rhythm Short PR interval Probable left atrial enlargement IVCD, consider atypical LBBB no significant change since earlier in the day Confirmed by Pricilla Loveless [phone]) on 01/07/2023 5:48:25 PM  Radiology DG Chest Port 1 View  Result Date: 01/07/2023 CLINICAL DATA:  Chest pain. EXAM: PORTABLE CHEST 1 VIEW COMPARISON:  January 26, 2015 FINDINGS: There is a dual lead  AICD with properly positioned lead wires noted. The heart size and mediastinal contours are within normal limits. Both lungs are clear. The visualized skeletal structures are unremarkable. IMPRESSION: No active cardiopulmonary disease. Electronically Signed   By: Aram Candela M.D.   On: 01/07/2023 19:39    Procedures .Critical Care  Performed by: Pricilla Loveless, MD Authorized by: Pricilla Loveless, MD   Critical care provider statement:    Critical care time (minutes):  35   Critical care time was exclusive of:  Separately billable procedures and treating other patients   Critical care was necessary to treat or prevent imminent or life-threatening deterioration of the following conditions:  Cardiac failure   Critical care was time spent personally by me on the following activities:  Development of treatment plan with patient or surrogate, discussions with consultants, evaluation of patient's response to treatment, examination of patient, ordering and review of laboratory studies, ordering and review of radiographic studies, ordering and performing treatments and interventions, pulse oximetry, re-evaluation of patient's condition and review of old charts     Medications Ordered in ED Medications  nitroGLYCERIN (NITROSTAT) SL tablet 0.4 mg (0.4 mg Sublingual Given 01/07/23 1634)  nitroGLYCERIN 50 mg in dextrose 5 % 250 mL (0.2 mg/mL) infusion (30 mcg/min Intravenous Rate/Dose Change 01/07/23 1927)  enoxaparin  (LOVENOX) injection 80 mg (has no administration in time range)  aspirin chewable tablet 324 mg (324 mg Oral Given 01/07/23 1626)    ED Course/ Medical Decision Making/ A&P                                 Medical Decision Making Amount and/or Complexity of Data Reviewed Labs: ordered.    Details: Mildly elevated troponin at 23 Radiology: ordered and independent interpretation performed.    Details: No CHF ECG/medicine tests: ordered and independent interpretation performed.    Details: Nonspecific changes  Risk OTC drugs. Prescription drug management. Decision regarding hospitalization.   Patient presents with chest pain.  Given her multiple risk factors and presentation including diaphoresis at the onset, I am concerned about unstable angina versus early NSTEMI.  She was started on heparin (changed to Lovenox by pharmacy).  She was also put on a nitro drip.  Initial aspirin and nitro did completely resolve her symptoms though she remained quite hypertensive so a nitro drip was started after discussing with cardiology, Dr. Tresa Endo.  Cards will see in the morning but for now, with no STEMI and resolution of pain, they will consult in the morning.  No indication for an emergent cath.  Her EKG shows some subtle nonspecific changes that do seem to change between EKGs but no overt ST depressions or elevations.  She does not have any radicular pain and I think PE and dissection are unlikely.  Discussed with Dr. Mikeal Hawthorne for admission.        Final Clinical Impression(s) / ED Diagnoses Final diagnoses:  Unstable angina [facility])    Rx / DC Orders ED Discharge Orders     None         Pricilla Loveless, MD 01/07/23 1943

## 2023-01-07 NOTE — ED Notes (Signed)
ED TO INPATIENT HANDOFF REPORT  ED Nurse Name and Phone #: Delice Bison, RN  S Name/Age/Gender [name] 57 y.o. female Room/Bed: WA25/WA25  Code Status   Code Status: Prior  Home/SNF/Other Home Patient oriented to: self, place, time, and situation Is this baseline? Yes   Triage Complete: Triage complete  Chief Complaint Unstable angina [facility]) [I20.0]  Triage Note Pt states she developed chest pain mid chest to left side today, Pacemaker placed in March, pt with pain, sweating and shob.    Allergies Allergies  Allergen Reactions   Penicillins Hives and Itching    Has patient had a PCN reaction causing immediate rash, facial/tongue/throat swelling, SOB or lightheadedness with hypotension: no Has patient had a PCN reaction causing severe rash involving mucus membranes or skin necrosis: no Has patient had a PCN reaction that required hospitalization no Has patient had a PCN reaction occurring within the last 10 years: no If all of the above answers are "NO", then may proceed with Cephalosporin use.    Shellfish Allergy Other (See Comments)    "It makes my tongue swell and itch but I eat it anyway". She usually takes a Benadryl before eating it.    Level of Care/Admitting Diagnosis ED Disposition     ED Disposition  Admit   Condition  --   Comment  Hospital Area: [facility] [[id number]]  Level of Care: Telemetry [5]  Admit to tele based on following criteria: Monitor for Ischemic changes  May place patient in observation at [facility] or [facility] if equivalent level of care is available:: Yes  Covid Evaluation: Asymptomatic - no recent exposure (last 10 days) testing not required  Diagnosis: Unstable angina [facility]) [[id number]]  Admitting Physician: Rometta Emery [[id number]]  Attending Physician: Rometta Emery [[id number]]          B Medical/Surgery History Past Medical History:  Diagnosis Date   Abnormal TSH    10/2011 - additional hormones  pending   Anxiety    Asthma    Atypical chest pain    recent low risk myoview   Coronary artery spasm (HCC)    a. NSTEMI 10/2011 with no CAD by cath but evidence of spasm when injecting left coronary artery.    Hyperlipidemia    Hypertension    LBBB (left bundle branch block)    Noncompliance    SVT (supraventricular tachycardia) (HCC) 10/22/10   wide complex tachycardia of same QRS as intinsic LBBB   Past Surgical History:  Procedure Laterality Date   LEFT HEART CATHETERIZATION WITH CORONARY ANGIOGRAM N/A 11/20/2011   Procedure: LEFT HEART CATHETERIZATION WITH CORONARY ANGIOGRAM;  Surgeon: Tonny Bollman, MD;  Location: [facility] CATH LAB;  Service: Cardiovascular;  Laterality: N/A;   TUBAL LIGATION       A IV Location/Drains/Wounds Patient Lines/Drains/Airways Status     Active Line/Drains/Airways     Name Placement date Placement time Site Days   Peripheral IV 01/07/23 20 G 1" Anterior;Right Forearm 01/07/23  1612  Forearm  less than 1            Intake/Output Last 24 hours No intake or output data in the 24 hours ending 01/07/23 1851  Labs/Imaging Results for orders placed or performed during the hospital encounter of 01/07/23 (from the past 48 hour(s))  Basic metabolic panel     Status: Abnormal   Collection Time: 01/07/23  4:05 PM  Result Value Ref Range   Sodium 144 135 - 145 mmol/L  Potassium 3.8 3.5 - 5.1 mmol/L   Chloride 110 98 - 111 mmol/L   CO2 22 22 - 32 mmol/L   Glucose, Bld 98 70 - 99 mg/dL    Comment: Glucose reference range applies only to samples taken after fasting for at least 8 hours.   BUN 26 (H) 6 - 20 mg/dL   Creatinine, Ser 4.09 (H) 0.44 - 1.00 mg/dL   Calcium 9.7 8.9 - 81.1 mg/dL   GFR, Estimated 60 (L) >60 mL/min    Comment: (NOTE) Calculated using the CKD-EPI Creatinine Equation (2021)    Anion gap 12 5 - 15    Comment: Performed at [facility], [address] [place], [place] [address]  CBC     Status: Abnormal    Collection Time: 01/07/23  4:05 PM  Result Value Ref Range   WBC 7.8 4.0 - 10.5 K/uL   RBC 5.32 (H) 3.87 - 5.11 MIL/uL   Hemoglobin 14.7 12.0 - 15.0 g/dL   HCT 29.5 (H) 62.1 - 30.8 %   MCV 89.5 80.0 - 100.0 fL   MCH 27.6 26.0 - 34.0 pg   MCHC 30.9 30.0 - 36.0 g/dL   RDW 65.7 (H) 84.6 - 96.2 %   Platelets 189 150 - 400 K/uL   nRBC 0.0 0.0 - 0.2 %    Comment: Performed at [facility], [address] [place], [place] [address]  Troponin I (High Sensitivity)     Status: Abnormal   Collection Time: 01/07/23  4:05 PM  Result Value Ref Range   Troponin I (High Sensitivity) 23 (H) <18 ng/L    Comment: (NOTE) Elevated high sensitivity troponin I (hsTnI) values and significant  changes across serial measurements may suggest ACS but many other  chronic and acute conditions are known to elevate hsTnI results.  Refer to the "Links" section for chest pain algorithms and additional  guidance. Performed at [facility], [address], [place], [place] [address]   hCG, serum, qualitative     Status: None   Collection Time: 01/07/23  4:05 PM  Result Value Ref Range   Preg, Serum NEGATIVE NEGATIVE    Comment:        THE SENSITIVITY OF THIS METHODOLOGY IS >10 mIU/mL. Performed at [facility], [address] [place], [place] [address]   Brain natriuretic peptide     Status: Abnormal   Collection Time: 01/07/23  4:05 PM  Result Value Ref Range   B Natriuretic Peptide 112.2 (H) 0.0 - 100.0 pg/mL    Comment: Performed at [facility], [address], [place], [place] [address]   No results found.  Pending Labs Unresulted Labs (From admission, onward)     Start     Ordered   01/08/23 0500  CBC  Every 72 hours,   R (with STAT occurrences)      01/07/23 1829            Vitals/Pain Today's Vitals   01/07/23 1700 01/07/23 1730 01/07/23 1800 01/07/23 1830  BP: (!) 200/121 (!) 167/111 (!) 162/109 (!) 187/127   Pulse: 61 63  63  Resp: 16 12 16 15   Temp:      TempSrc:      SpO2: 98% 100%  98%  Weight:      Height:      PainSc:        Isolation Precautions No active isolations  Medications Medications  nitroGLYCERIN (NITROSTAT) SL  tablet 0.4 mg (0.4 mg Sublingual Given 01/07/23 1634)  nitroGLYCERIN 50 mg in dextrose 5 % 250 mL (0.2 mg/mL) infusion (10 mcg/min Intravenous Rate/Dose Change 01/07/23 1839)  enoxaparin (LOVENOX) injection 80 mg (has no administration in time range)  aspirin chewable tablet 324 mg (324 mg Oral Given 01/07/23 1626)    Mobility walks     Focused Assessments Cardiac Assessment Handoff:  Cardiac Rhythm: Normal sinus rhythm Lab Results  Component Value Date   CKTOTAL 119 11/19/2011   CKMB 6.1 (HH) 11/19/2011   TROPONINI <0.30 05/23/2013   Lab Results  Component Value Date   DDIMER 0.36 05/29/2013   Does the Patient currently have chest pain? No    R Recommendations: See Admitting Provider Note  Report given to:   Additional Notes:

## 2023-01-07 NOTE — ED Notes (Signed)
Receiving RN Kenard Gower has agreed to accept [facility] once pt has arrived to inpatient unit, all questions and concerns address. Pt remains on cardiac monitor and nitroglycerin drip at 42mcg/min. Pt denies CP, A&O x4, NAD noted

## 2023-01-07 NOTE — ED Provider Triage Note (Signed)
Emergency Medicine Provider Triage Evaluation Note  [name] , a 57 y.o. female  was evaluated in triage.  Pt complains of chest pain since 1400 today and has been intermittent. She has a device in her chest, thinks pacemake defibrillator.  She reports recent intermittent waves of chest pain going to the right side of her chest going into her left and down her arm.  Has not felt her pacemaker fired.  Review of Systems  Positive:  Negative:   Physical Exam  BP (!) 208/113 (BP Location: Right Arm)   Pulse 61   Temp 98.4 F (36.9 C) (Oral)   Resp 19   Ht 5\' 1"  (1.549 m)   Wt 82.6 kg   SpO2 99%   BMI 34.39 kg/m  Gen:   Awake, no distress   Resp:  Normal effort  MSK:   Moves extremities without difficulty  Other:  Palpable and symmetric radial pulses.  Rhythm feels regular.  Medical Decision Making  Medically screening exam initiated at 3:53 PM.  Appropriate orders placed.  [name] was informed that the remainder of the evaluation will be completed by another provider, this initial triage assessment does not replace that evaluation, and the importance of remaining in the ED until their evaluation is complete.  H/o cardiac arrest this year. Patient is being roomed now. Labs ordered   Achille Rich, [place] 01/07/23 1555

## 2023-01-07 NOTE — ED Notes (Signed)
Provider aware of pt BP of 221/118

## 2023-01-07 NOTE — Progress Notes (Signed)
PHARMACY - ANTICOAGULATION CONSULT NOTE  Pharmacy Consult for Lovenox Indication: chest pain/ACS  Allergies  Allergen Reactions   Penicillins Hives and Itching    Has patient had a PCN reaction causing immediate rash, facial/tongue/throat swelling, SOB or lightheadedness with hypotension: no Has patient had a PCN reaction causing severe rash involving mucus membranes or skin necrosis: no Has patient had a PCN reaction that required hospitalization no Has patient had a PCN reaction occurring within the last 10 years: no If all of the above answers are "NO", then may proceed with Cephalosporin use.    Shellfish Allergy Other (See Comments)    "It makes my tongue swell and itch but I eat it anyway". She usually takes a Benadryl before eating it.    Patient Measurements: Height: 5\' 1"  (154.9 cm) Weight: 82.6 kg (182 lb) IBW/kg (Calculated) : 47.8  Vital Signs: Temp: 98.4 F (36.9 C) (10/16 1547) Temp Source: Oral (10/16 1547) BP: 221/118 (10/16 1645) Pulse Rate: 60 (10/16 1645)  Labs: Recent Labs    01/07/23 1605  HGB 14.7  HCT 47.6*  PLT 189  CREATININE 1.08*  TROPONINIHS 23*    Estimated Creatinine Clearance: 55.3 mL/min (A) (by C-G formula based on SCr of 1.08 mg/dL (H)).   Medical History: Past Medical History:  Diagnosis Date   Abnormal TSH    10/2011 - additional hormones pending   Anxiety    Asthma    Atypical chest pain    recent low risk myoview   Coronary artery spasm (HCC)    a. NSTEMI 10/2011 with no CAD by cath but evidence of spasm when injecting left coronary artery.    Hyperlipidemia    Hypertension    LBBB (left bundle branch block)    Noncompliance    SVT (supraventricular tachycardia) (HCC) 10/22/10   wide complex tachycardia of same QRS as intinsic LBBB   Assessment: Hypertensive Emergency, CP/ACS, SOB  AC/Heme: CP/ACS. Baseline CBC WNL. Troponin 23  Goal of Therapy:  Anti-Xa level 0.6-1 units/ml 4hrs after LMWH dose given Monitor  platelets by anticoagulation protocol: Yes   Plan:  Lovenox 80mg  SQ BID CBC q72h while on LMWH   Liane Tribbey S. Merilynn Finland, PharmD, BCPS Clinical Staff Pharmacist [email or web address] Merilynn Finland, [employer] 01/07/2023,6:30 PM

## 2023-01-07 NOTE — ED Triage Notes (Signed)
Pt states she developed chest pain mid chest to left side today, Pacemaker placed in March, pt with pain, sweating and shob.

## 2023-01-07 NOTE — H&P (Signed)
History and Physical    Patient: [name] WUJ:[id number] DOB: 1965-06-25 DOA: 01/07/2023 DOS: the patient was seen and examined on 01/07/2023 PCP: Corrin Parker, FNP  Patient coming from: Home  Chief Complaint:  Chief Complaint  Patient presents with   Chest Pain   HPI: [name] is a 57 y.o. female with medical history significant of left bundle branch block, morbid obesity, diastolic dysfunction CHF, coronary artery spasms with cath in 2013 showing no obstruction, anxiety disorder, asthma, hyperlipidemia and essential hypertension who presented to the ER with central chest pain that has been going on for a day.  Was small pressure.  Rated as 9 out of 10 initially.  Responded to nitroglycerin as well as morphine in the ER.  Chest pain is now gone.  Patient was seen and evaluated.  EKG was nondiagnostic.  Chest x-ray showed no acute findings.  Initial cardiac enzymes showed troponin of 23 with a second reading of 26 delta of 3.  Case was discussed with cardiologist who recommended admission with heparin and nitroglycerin.  Patient to be kept of [facility] on to she will seen at which point she can be transferred to:[name]  Patient is therefore being admitted to the medical service for rule out MI.  She denied any radiation with her chest pain.  Was mainly in the central chest. Review of Systems: As mentioned in the history of present illness. All other systems reviewed and are negative. Past Medical History:  Diagnosis Date   Abnormal TSH    10/2011 - additional hormones pending   Anxiety    Asthma    Atypical chest pain    recent low risk myoview   Coronary artery spasm (HCC)    a. NSTEMI 10/2011 with no CAD by cath but evidence of spasm when injecting left coronary artery.    Hyperlipidemia    Hypertension    LBBB (left bundle branch block)    Noncompliance    SVT (supraventricular tachycardia) (HCC) 10/22/10   wide complex tachycardia of same QRS as intinsic LBBB   Past  Surgical History:  Procedure Laterality Date   LEFT HEART CATHETERIZATION WITH CORONARY ANGIOGRAM N/A 11/20/2011   Procedure: LEFT HEART CATHETERIZATION WITH CORONARY ANGIOGRAM;  Surgeon: Tonny Bollman, MD;  Location: [facility] CATH LAB;  Service: Cardiovascular;  Laterality: N/A;   TUBAL LIGATION     Social History:  reports that she has been smoking. She has never used smokeless tobacco. She reports current alcohol use of about 5.0 standard drinks of alcohol per week. She reports current drug use. Drug: Marijuana.  Allergies  Allergen Reactions   Amoxicillin Hives   Penicillins Hives and Itching    Has patient had a PCN reaction causing immediate rash, facial/tongue/throat swelling, SOB or lightheadedness with hypotension: no Has patient had a PCN reaction causing severe rash involving mucus membranes or skin necrosis: no Has patient had a PCN reaction that required hospitalization no Has patient had a PCN reaction occurring within the last 10 years: no If all of the above answers are "NO", then may proceed with Cephalosporin use.    Shellfish Allergy Other (See Comments)    "It makes my tongue swell and itch but I eat it anyway". She usually takes a Benadryl before eating it.    Family History  Problem Relation Age of Onset   Stroke Mother     Prior to Admission medications   Medication Sig Start Date End Date Taking? Authorizing Provider  acetaminophen (TYLENOL) 500  MG tablet Take 500 mg by mouth as needed. Patient not taking: Reported on 09/10/2022    [provider]  albuterol (PROVENTIL HFA;VENTOLIN HFA) 108 (90 BASE) MCG/ACT inhaler Inhale 2 puffs into the lungs every 6 (six) hours as needed. For wheezing    [provider]  amiodarone (PACERONE) 200 MG tablet Take 1 tablet (200 mg total) by mouth daily. 09/10/22   Croitoru, Mihai, MD  aspirin EC 81 MG tablet Take 81 mg by mouth daily. 04/22/22   [provider]  atorvastatin (LIPITOR) 10 MG tablet Take 1  tablet (10 mg total) by mouth daily. 03/09/15   Ward, Chase Picket, PA-C  cephALEXin (KEFLEX) 500 MG capsule Take 500 mg by mouth 2 (two) times daily. 05/23/22   [provider]  dapagliflozin propanediol (FARXIGA) 10 MG TABS tablet Take 1 tablet (10 mg total) by mouth daily before breakfast. 09/10/22   Croitoru, Mihai, MD  furosemide (LASIX) 20 MG tablet Take 20 mg by mouth daily.    [provider]  gabapentin (NEURONTIN) 600 MG tablet Take 600 mg by mouth 3 (three) times daily.    [provider]  ibuprofen (ADVIL,MOTRIN) 600 MG tablet Take 1 tablet (600 mg total) by mouth every 6 (six) hours as needed. 05/29/13   Nadara Mustard, MD  Iron 66 MG TABS Take 65 mg by mouth every morning.     [provider]  isosorbide mononitrate (IMDUR) 60 MG 24 hr tablet Take 1 tablet (60 mg total) by mouth daily. 09/10/22   Croitoru, Mihai, MD  metoprolol succinate (TOPROL-XL) 50 MG 24 hr tablet Take 1 tablet (50 mg total) by mouth daily. Take with or immediately following a meal. 09/10/22   Croitoru, Mihai, MD  naphazoline-glycerin (CLEAR EYES) 0.012-0.2 % SOLN Place 1-2 drops into both eyes every 4 (four) hours as needed. For itchy eyes    [provider]  nitroGLYCERIN (NITROSTAT) 0.4 MG SL tablet Place 0.4 mg under the tongue every 5 (five) minutes as needed for chest pain.    [provider]  sacubitril-valsartan (ENTRESTO) 49-51 MG Take 1 tablet by mouth 2 (two) times daily. 09/10/22   Croitoru, Mihai, MD  venlafaxine (EFFEXOR) 25 MG tablet Take 25 mg by mouth daily in the afternoon. 08/24/20   [provider]    Physical Exam: Vitals:   01/07/23 2145 01/07/23 2200 01/07/23 2215 01/07/23 2230  BP: (!) 164/99 (!) 170/103 (!) 153/88 (!) 147/96  Pulse:      Resp: 20 14 15 17   Temp:      TempSrc:      SpO2:      Weight:      Height:       Constitutional: Pleasant, obese, no distress, calm, comfortable Eyes: PERRL, lids and conjunctivae  normal ENMT: Mucous membranes are moist. Posterior pharynx clear of any exudate or lesions.Normal dentition.  Neck: normal, supple, no masses, no thyromegaly Respiratory: clear to auscultation bilaterally, no wheezing, no crackles. Normal respiratory effort. No accessory muscle use.  #2 chronic Cardiovascular: Regular rate and rhythm, no murmurs / rubs / gallops. No extremity edema. 2+ pedal pulses. No carotid bruits.  Abdomen: no tenderness, no masses palpated. No hepatosplenomegaly. Bowel sounds positive.  Musculoskeletal: Good range of motion, no joint swelling or tenderness, Skin: no rashes, lesions, ulcers. No induration Neurologic: CN 2-12 grossly intact. Sensation intact, DTR normal. Strength 5/5 in all 4.  Psychiatric: Normal judgment and insight. Alert and oriented x 3. Normal mood  Data Reviewed:  Blood pressure 170/103, BUN 23 creatinine 1.08, BNP 112, initial troponin 23-second troponin 26, CBC largely within chest x-ray showed no active disease.  EKG showed normal sinus rhythm with prolonged PR interval of 232, nonspecific ST changes  Assessment and Plan:   #1 unstable angina: Patient has nonobstructive coronary artery disease from 2013 cardiac cath.  She has chest pain.  She does have risk factors for coronary artery disease.  Will admit the patient for observation.  Full anticoagulation on nitroglycerin.  Continue beta-blockers from home.  Cycle enzymes.  Get echocardiogram.  Depending on the results cardiology will decide any further procedures.  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  Continue Lasix, beta-blockers, Entresto and other home regimen.  #3 essential hypertension: Currently blood pressure is markedly elevated.  Patient on nitroglycerin drip.  Resume home regimen and monitor.  #4 status post ICD placement: Stable.  Has chronic history of left bundle branch block also.  At this point continue to monitor.  #5 morbid obesity:  Dietary counseling  #6 chronic kidney disease stage IIIa: Continue to monitor.    Advance Care Planning:   Code Status: Full Code   Consults: Cardiology consulted from the ER  Family Communication: Husband at bedside  Severity of Illness: The appropriate patient status for this patient is OBSERVATION. Observation status is judged to be reasonable and necessary in order to provide the required intensity of service to ensure the patient's safety. The patient's presenting symptoms, physical exam findings, and initial radiographic and laboratory data in the context of their medical condition is felt to place them at decreased risk for further clinical deterioration. Furthermore, it is anticipated that the patient will be medically stable for discharge from the hospital within 2 midnights of admission.   AuthorLonia Blood, MD 01/07/2023 10:57 PM  For on call review [email or web address].

## 2023-01-08 ENCOUNTER — Encounter (HOSPITAL_COMMUNITY): Payer: Self-pay | Admitting: Internal Medicine

## 2023-01-08 ENCOUNTER — Observation Stay (HOSPITAL_COMMUNITY): Payer: Medicaid Other

## 2023-01-08 ENCOUNTER — Other Ambulatory Visit: Payer: Self-pay | Admitting: Home Health

## 2023-01-08 ENCOUNTER — Encounter (HOSPITAL_COMMUNITY)
Admission: EM | Disposition: A | Discharge status: Home / Self Care | Payer: Self-pay | Source: Home / Self Care | Attending: Emergency Medicine

## 2023-01-08 DIAGNOSIS — I10 Essential (primary) hypertension: Secondary | ICD-10-CM

## 2023-01-08 DIAGNOSIS — E7800 Pure hypercholesterolemia, unspecified: Secondary | ICD-10-CM

## 2023-01-08 DIAGNOSIS — I43 Cardiomyopathy in diseases classified elsewhere: Secondary | ICD-10-CM

## 2023-01-08 DIAGNOSIS — I5042 Chronic combined systolic (congestive) and diastolic (congestive) heart failure: Secondary | ICD-10-CM

## 2023-01-08 DIAGNOSIS — N1831 Chronic kidney disease, stage 3a: Secondary | ICD-10-CM

## 2023-01-08 DIAGNOSIS — Z95810 Presence of automatic (implantable) cardiac defibrillator: Secondary | ICD-10-CM

## 2023-01-08 DIAGNOSIS — R079 Chest pain, unspecified: Secondary | ICD-10-CM

## 2023-01-08 DIAGNOSIS — I160 Hypertensive urgency: Secondary | ICD-10-CM

## 2023-01-08 DIAGNOSIS — I110 Hypertensive heart disease with heart failure: Secondary | ICD-10-CM

## 2023-01-08 DIAGNOSIS — I2583 Coronary atherosclerosis due to lipid rich plaque: Secondary | ICD-10-CM

## 2023-01-08 DIAGNOSIS — R7989 Other specified abnormal findings of blood chemistry: Secondary | ICD-10-CM | POA: Diagnosis not present

## 2023-01-08 DIAGNOSIS — I25110 Atherosclerotic heart disease of native coronary artery with unstable angina pectoris: Secondary | ICD-10-CM | POA: Diagnosis not present

## 2023-01-08 DIAGNOSIS — I200 Unstable angina: Secondary | ICD-10-CM | POA: Diagnosis not present

## 2023-01-08 DIAGNOSIS — Z5189 Encounter for other specified aftercare: Secondary | ICD-10-CM

## 2023-01-08 HISTORY — PX: RIGHT/LEFT HEART CATH AND CORONARY ANGIOGRAPHY: CATH118266

## 2023-01-08 LAB — COMPREHENSIVE METABOLIC PANEL
ALT: 15 U/L (ref 0–44)
AST: 18 U/L (ref 15–41)
Albumin: 3.6 g/dL (ref 3.5–5.0)
Alkaline Phosphatase: 62 U/L (ref 38–126)
Anion gap: 14 (ref 5–15)
BUN: 26 mg/dL — ABNORMAL HIGH (ref 6–20)
CO2: 20 mmol/L — ABNORMAL LOW (ref 22–32)
Calcium: 9.3 mg/dL (ref 8.9–10.3)
Chloride: 105 mmol/L (ref 98–111)
Creatinine, Ser: 1.12 mg/dL — ABNORMAL HIGH (ref 0.44–1.00)
GFR, Estimated: 57 mL/min — ABNORMAL LOW (ref >60)
Glucose, Bld: 115 mg/dL — ABNORMAL HIGH (ref 70–99)
Potassium: 3.3 mmol/L — ABNORMAL LOW (ref 3.5–5.1)
Sodium: 139 mmol/L (ref 135–145)
Total Bilirubin: 0.7 mg/dL (ref 0.3–1.2)
Total Protein: 6.8 g/dL (ref 6.5–8.1)

## 2023-01-08 LAB — ECHOCARDIOGRAM COMPLETE
Area-P 1/2: 3.54 cm2
Height: 61 in
S' Lateral: 5.4 cm
Weight: 2912 [oz_av]

## 2023-01-08 LAB — POCT I-STAT EG7
Acid-base deficit: 1 mmol/L (ref 0.0–2.0)
Acid-base deficit: 1 mmol/L (ref 0.0–2.0)
Bicarbonate: 24.4 mmol/L (ref 20.0–28.0)
Bicarbonate: 24.6 mmol/L (ref 20.0–28.0)
Calcium, Ion: 1.18 mmol/L (ref 1.15–1.40)
Calcium, Ion: 1.18 mmol/L (ref 1.15–1.40)
HCT: 42 % (ref 36.0–46.0)
HCT: 42 % (ref 36.0–46.0)
Hemoglobin: 14.3 g/dL (ref 12.0–15.0)
Hemoglobin: 14.3 g/dL (ref 12.0–15.0)
O2 Saturation: 66 %
O2 Saturation: 73 %
Potassium: 3.3 mmol/L — ABNORMAL LOW (ref 3.5–5.1)
Potassium: 3.4 mmol/L — ABNORMAL LOW (ref 3.5–5.1)
Sodium: 142 mmol/L (ref 135–145)
Sodium: 143 mmol/L (ref 135–145)
TCO2: 26 mmol/L (ref 22–32)
TCO2: 26 mmol/L (ref 22–32)
pCO2, Ven: 43.6 mm[Hg] — ABNORMAL LOW (ref 44–60)
pCO2, Ven: 43.8 mm[Hg] — ABNORMAL LOW (ref 44–60)
pH, Ven: 7.353 (ref 7.25–7.43)
pH, Ven: 7.359 (ref 7.25–7.43)
pO2, Ven: 36 mm[Hg] (ref 32–45)
pO2, Ven: 40 mm[Hg] (ref 32–45)

## 2023-01-08 LAB — POCT I-STAT 7, (LYTES, BLD GAS, ICA,H+H)
Acid-base deficit: 2 mmol/L (ref 0.0–2.0)
Bicarbonate: 21.6 mmol/L (ref 20.0–28.0)
Calcium, Ion: 1.18 mmol/L (ref 1.15–1.40)
HCT: 42 % (ref 36.0–46.0)
Hemoglobin: 14.3 g/dL (ref 12.0–15.0)
O2 Saturation: 93 %
Potassium: 3.5 mmol/L (ref 3.5–5.1)
Sodium: 141 mmol/L (ref 135–145)
TCO2: 23 mmol/L (ref 22–32)
pCO2 arterial: 34 mm[Hg] (ref 32–48)
pH, Arterial: 7.411 (ref 7.35–7.45)
pO2, Arterial: 67 mm[Hg] — ABNORMAL LOW (ref 83–108)

## 2023-01-08 LAB — CBC
HCT: 43.9 % (ref 36.0–46.0)
Hemoglobin: 13.6 g/dL (ref 12.0–15.0)
MCH: 28.1 pg (ref 26.0–34.0)
MCHC: 31 g/dL (ref 30.0–36.0)
MCV: 90.7 fL (ref 80.0–100.0)
Platelets: 157 10*3/uL (ref 150–400)
RBC: 4.84 MIL/uL (ref 3.87–5.11)
RDW: 15.9 % — ABNORMAL HIGH (ref 11.5–15.5)
WBC: 7.5 10*3/uL (ref 4.0–10.5)
nRBC: 0 % (ref 0.0–0.2)

## 2023-01-08 LAB — MAGNESIUM: Magnesium: 2.4 mg/dL (ref 1.7–2.4)

## 2023-01-08 LAB — BRAIN NATRIURETIC PEPTIDE: B Natriuretic Peptide: 156.8 pg/mL — ABNORMAL HIGH (ref 0.0–100.0)

## 2023-01-08 LAB — LIPID PANEL
Cholesterol: 267 mg/dL — ABNORMAL HIGH (ref 0–200)
HDL: 66 mg/dL (ref >40)
LDL Cholesterol: 161 mg/dL — ABNORMAL HIGH (ref 0–99)
Total CHOL/HDL Ratio: 4 {ratio}
Triglycerides: 199 mg/dL — ABNORMAL HIGH (ref <150)
VLDL: 40 mg/dL (ref 0–40)

## 2023-01-08 LAB — PHOSPHORUS: Phosphorus: 2.7 mg/dL (ref 2.5–4.6)

## 2023-01-08 LAB — TROPONIN I (HIGH SENSITIVITY): Troponin I (High Sensitivity): 25 ng/L — ABNORMAL HIGH (ref <18)

## 2023-01-08 LAB — HIV ANTIBODY (ROUTINE TESTING W REFLEX): HIV Screen 4th Generation wRfx: NONREACTIVE

## 2023-01-08 SURGERY — RIGHT/LEFT HEART CATH AND CORONARY ANGIOGRAPHY
Anesthesia: LOCAL

## 2023-01-08 MED ORDER — EZETIMIBE 10 MG PO TABS
10.0000 mg | ORAL_TABLET | Freq: Every day | ORAL | Status: DC
  Administered 2023-01-08 – 2023-01-09 (×2): 10 mg via ORAL
  Filled 2023-01-08 (×2): qty 1

## 2023-01-08 MED ORDER — HEPARIN SODIUM (PORCINE) 1000 UNIT/ML IJ SOLN
INTRAMUSCULAR | Status: AC
  Filled 2023-01-08: qty 10

## 2023-01-08 MED ORDER — MIDAZOLAM HCL 2 MG/2ML IJ SOLN
INTRAMUSCULAR | Status: AC
  Filled 2023-01-08: qty 2

## 2023-01-08 MED ORDER — HEPARIN SODIUM (PORCINE) 1000 UNIT/ML IJ SOLN
INTRAMUSCULAR | Status: DC | PRN
  Administered 2023-01-08: 5000 [IU] via INTRAVENOUS

## 2023-01-08 MED ORDER — SODIUM CHLORIDE 0.9 % IV SOLN: INTRAVENOUS | Status: DC

## 2023-01-08 MED ORDER — ATORVASTATIN CALCIUM 40 MG PO TABS
80.0000 mg | ORAL_TABLET | Freq: Every day | ORAL | Status: DC
  Administered 2023-01-08: 80 mg via ORAL
  Filled 2023-01-08: qty 2

## 2023-01-08 MED ORDER — FENTANYL CITRATE (PF) 100 MCG/2ML IJ SOLN
INTRAMUSCULAR | Status: AC
  Filled 2023-01-08: qty 2

## 2023-01-08 MED ORDER — VERAPAMIL HCL 2.5 MG/ML IV SOLN
INTRAVENOUS | Status: AC
  Filled 2023-01-08: qty 2

## 2023-01-08 MED ORDER — CARVEDILOL 25 MG PO TABS
25.0000 mg | ORAL_TABLET | Freq: Two times a day (BID) | ORAL | Status: DC
  Administered 2023-01-09: 25 mg via ORAL
  Filled 2023-01-08: qty 1

## 2023-01-08 MED ORDER — SODIUM CHLORIDE 0.9 % IV SOLN: 250.0000 mL | INTRAVENOUS | Status: DC | PRN

## 2023-01-08 MED ORDER — HEPARIN (PORCINE) IN NACL 2000-0.9 UNIT/L-% IV SOLN
INTRAVENOUS | Status: DC | PRN
  Administered 2023-01-08: 1000 mL

## 2023-01-08 MED ORDER — SODIUM CHLORIDE 0.9% FLUSH: 3.0000 mL | INTRAVENOUS | Status: DC | PRN

## 2023-01-08 MED ORDER — CARVEDILOL 12.5 MG PO TABS
12.5000 mg | ORAL_TABLET | Freq: Two times a day (BID) | ORAL | Status: DC
Start: 2023-01-09 — End: 2023-01-08

## 2023-01-08 MED ORDER — SODIUM CHLORIDE 0.9% FLUSH
3.0000 mL | Freq: Two times a day (BID) | INTRAVENOUS | Status: DC
  Administered 2023-01-09: 3 mL via INTRAVENOUS

## 2023-01-08 MED ORDER — SODIUM CHLORIDE 0.9 % IV SOLN: INTRAVENOUS | Status: AC

## 2023-01-08 MED ORDER — IOHEXOL 350 MG/ML SOLN
INTRAVENOUS | Status: DC | PRN
  Administered 2023-01-08: 55 mL

## 2023-01-08 MED ORDER — VERAPAMIL HCL 2.5 MG/ML IV SOLN
INTRAVENOUS | Status: DC | PRN
  Administered 2023-01-08: 10 mL via INTRA_ARTERIAL

## 2023-01-08 MED ORDER — LIDOCAINE HCL (PF) 1 % IJ SOLN
INTRAMUSCULAR | Status: AC
  Filled 2023-01-08: qty 30

## 2023-01-08 MED ORDER — POTASSIUM CHLORIDE CRYS ER 20 MEQ PO TBCR
40.0000 meq | EXTENDED_RELEASE_TABLET | Freq: Two times a day (BID) | ORAL | Status: AC
  Administered 2023-01-08 (×2): 40 meq via ORAL
  Filled 2023-01-08 (×2): qty 2

## 2023-01-08 MED ORDER — BUTALBITAL-APAP-CAFFEINE 50-325-40 MG PO TABS
1.0000 | ORAL_TABLET | Freq: Four times a day (QID) | ORAL | Status: DC | PRN
  Administered 2023-01-08: 1 via ORAL
  Filled 2023-01-08: qty 1

## 2023-01-08 MED ORDER — HYDRALAZINE HCL 20 MG/ML IJ SOLN: 10.0000 mg | INTRAMUSCULAR | Status: AC | PRN

## 2023-01-08 MED ORDER — MIDAZOLAM HCL 2 MG/2ML IJ SOLN
INTRAMUSCULAR | Status: DC | PRN
  Administered 2023-01-08 (×5): 1 mg via INTRAVENOUS

## 2023-01-08 MED ORDER — LIDOCAINE HCL (PF) 1 % IJ SOLN
INTRAMUSCULAR | Status: DC | PRN
  Administered 2023-01-08 (×2): 2 mL

## 2023-01-08 MED ORDER — LABETALOL HCL 5 MG/ML IV SOLN: 10.0000 mg | INTRAVENOUS | Status: AC | PRN

## 2023-01-08 MED ORDER — FENTANYL CITRATE (PF) 100 MCG/2ML IJ SOLN
INTRAMUSCULAR | Status: DC | PRN
  Administered 2023-01-08 (×5): 25 ug via INTRAVENOUS

## 2023-01-08 MED ORDER — HYDRALAZINE HCL 20 MG/ML IJ SOLN: 10.0000 mg | INTRAMUSCULAR | Status: DC | PRN

## 2023-01-08 MED ORDER — ASPIRIN 81 MG PO CHEW: 81.0000 mg | CHEWABLE_TABLET | ORAL | Status: DC

## 2023-01-08 SURGICAL SUPPLY — 18 items
BALLN SAPPHIRE 2.0X12 (BALLOONS) ×1
BALLOON SAPPHIRE 2.0X12 (BALLOONS) IMPLANT
CATH BALLN WEDGE 5F 110CM (CATHETERS) IMPLANT
CATH INFINITI AMBI 6FR TG (CATHETERS) IMPLANT
DEVICE RAD COMP TR BAND LRG (VASCULAR PRODUCTS) IMPLANT
GLIDESHEATH SLEND SS 6F .021 (SHEATH) IMPLANT
GUIDEWIRE VAS SION BLUE 190 (WIRE) IMPLANT
KIT ENCORE 26 ADVANTAGE (KITS) IMPLANT
KIT ESSENTIALS PG (KITS) IMPLANT
PACK CARDIAC CATHETERIZATION (CUSTOM PROCEDURE TRAY) ×2 IMPLANT
SET ATX-X65L (MISCELLANEOUS) IMPLANT
SHEATH GLIDE SLENDER 4/5FR (SHEATH) IMPLANT
SHEATH PROBE COVER 6X72 (BAG) IMPLANT
WIRE ASAHI PROWATER 300CM (WIRE) IMPLANT
WIRE EMERALD 3MM-J .035X150CM (WIRE) IMPLANT
WIRE EMERALD 3MM-J .035X260CM (WIRE) IMPLANT
WIRE HI TORQ VERSACORE-J 145CM (WIRE) IMPLANT
WIRE MICRO SET SILHO 5FR 7 (SHEATH) IMPLANT

## 2023-01-08 NOTE — Progress Notes (Signed)
Cardiac cath reviewed with findings below: Conclusion   Prox RCA lesion is 40% stenosed.   Mid Cx lesion is 40% stenosed.   1.  Right radial arterial loop requiring balloon assisted tracking to negotiate. 2.  Mild coronary artery disease unchanged from previous study (at [facility]). 3.  Fick cardiac output of 4.8 L/min and Fick cardiac index of 2.7 liters per minute per meter squared with the following hemodynamics:             Right atrial pressure mean of 3 mmHg             Right ventricular pressure 31/0 with an end-diastolic pressure of 8 mmHg             Wedge pressure mean of 10 mmHg             Pulmonary artery pressure of 33/12 with a mean of 18 mmHg             PVR of 2.9 Woods units             PA pulsatility index of 6.3   Recommendation: Medical therapy  No change in CAD from prior study.  Right heart pressures normal and euvolemic.    2D echo  IMPRESSIONS    1. Left ventricular ejection fraction, by estimation, is 35 to 40%. The  left ventricle has mild to moderately decreased function. The left  ventricle demonstrates regional wall motion abnormalities (see scoring  diagram/findings for description). The left   ventricular internal cavity size was mildly to moderately dilated. There  is mild concentric left ventricular hypertrophy. Left ventricular  diastolic parameters are consistent with Grade II diastolic dysfunction  (pseudonormalization).   2. Right ventricular systolic function is low normal. The right  ventricular size is normal.   3. Left atrial size was moderately dilated.   4. The mitral valve is normal in structure. No evidence of mitral valve  regurgitation.   5. The aortic valve is normal in structure. Aortic valve regurgitation is  not visualized.   Comparison(s): Changes from prior study are noted. LV is less dilated,  wall motion abnormalities are similar.   Suspect that hypertensive urgency was the etiology of her acute CP and mildly elevated  hsTrop.  Needs aggressive BP control.  Continue GDMT for hypertensive DCM  No further recs at this time except what was recommended in initial consult.  Will sign off. Call with any questions.  [facility] will sign off.   Medication Recommendations:  Amiodarone 200mg  daily, ASA 81mg  daily, Atorvastatin 80mg  daily, Carvedilol 25mg  BID, Farxiga 10mg  daily, Zetia 10mg  daily, Lasix 20mg  daily, Entresto 49-51mg  BID, Imdur 60mg  daily.  Other recommendations (labs, testing, etc):  BMET in 1 week, FLP and ALT in 8 weeks Follow up as an outpatient:  Dr. Royann Shivers in 7-10 days or extender for BP followup

## 2023-01-08 NOTE — Progress Notes (Signed)
BMP in a week ordered, Dr Royann Shivers to follow, per Dr Mayford Knife request.

## 2023-01-08 NOTE — Interval H&P Note (Signed)
History and Physical Interval Note:  01/08/2023 2:38 PM  [name]  has presented today for surgery, with the diagnosis of chest pain.  The various methods of treatment have been discussed with the patient and family. After consideration of risks, benefits and other options for treatment, the patient has consented to  Procedure(s): RIGHT/LEFT HEART CATH AND CORONARY ANGIOGRAPHY (N/A) as a surgical intervention.  The patient's history has been reviewed, patient examined, no change in status, stable for surgery.  I have reviewed the patient's chart and labs.  Questions were answered to the patient's satisfaction.     Orbie Pyo

## 2023-01-08 NOTE — Progress Notes (Signed)
This patient returned from cath lab with Nitro gtt for BP not for chest pain. her cath was clean without findings.   Her BP143/108 upon arrival @1900  @ 1915 138/102. Reached out to the provider.Awaiting follow up. Report given to oncoming RN and CN

## 2023-01-08 NOTE — Progress Notes (Signed)
BMP in a week ordered, to be collected at [facility] office, already has appt at [facility] office 01/19/23, will keep. See AVS

## 2023-01-08 NOTE — Progress Notes (Signed)
PROGRESS NOTE    [name]  XBJ:[id number] DOB: 1965/11/28 DOA: 01/07/2023 PCP: Corrin Parker, FNP   Brief Narrative:  The patient is an obese African-American female with past medical history significant for but not limited to a left bundle branch block, obesity, chronic CHF, coronary artery spasms, body disorder, asthma, hypertension, hyperlipidemia as well as other comorbidities presented to the ED with central chest pain that began on Friday.  She rated as a 9 out of 10 in initial severity and responded well to nitroglycerin as well as morphine.  Chest pain is now gone and she is still pain-free but states it is very intermittent.  EKG was nondiagnostic and the case was discussed with cardiology who evaluated and recommended admission with heparin and nitroglycerin however she is placed on Lovenox instead.  Cardiology has been consulted and they have made some medication adjustments and now recommending a left heart catheterization to define her anatomy and have initiated her on GDMT and have ordered a repeat echocardiogram.  Assessment and Plan:  Chest Pain r/o ACS  -Patient has nonobstructive coronary artery disease from 2013 cardiac cath.   -She has intermittent chest pain and she does have risk factors for Coronary Artery Disease.   -Troponin Trend Relatively Flat and went from 23 -> 26 -> 25 -Admitted the patient for observation.  -She has been placed on a nitroglycerin drip and cardiology feels that her symptoms were concerning enough given the they are resolved in setting of nitro drip.  They suspected demand ischemia in setting of hypertensive urgency to coronary artery vasospasm spasm and not ACS but recommending to proceed with left heart catheter to redefine her coronary artery to make sure her RCA lesion has not progressed  -C/w ASA 81 mg po daily, IV Nitro gtt. Full dose Lovenox subcutaneous, Atorvastatin 80 mg po daily -Cardiology changing Metoprolol Succinate 50 mg po  Daily to Carvedilol 25 mg po BID -ECHOCardiogram also being ordered -Further care per Cardiology and she will go for a Left Heart Cath    Chronic Combined Systolic and Diastolic Heart Failure and Hx of Nonischemic Cardiomyopathy -Per Cardiology she has a NYHA Class I-II -Appears compensated and Euvolemic at this time.   -C/w Furosemide 20 mg po Daily, Entresto 49-51 mg po BID, and Farxiga 10 mg po Daily -BNP was 112.2 and repeat was 156. -Cardiology changing Metoprolol Succinate 50 mg po Daily to Carvedilol 25 mg po BID -Strict I's and O's and Daily Weights;  Intake/Output Summary (Last 24 hours) at 01/08/2023 1133 Last data filed at 01/08/2023 0600 Gross per 24 hour  Intake 331.58 ml  Output --  Net 331.58 ml  -Cardiology ordering ECHOCardiogram -Cardiology not going to start her on Spironolactone given that she has been tried in the past with both occasions causing worsening of her Renal Fxn -Continue to Monitor for S/Sx of Volume Overload -Per Cardiology she may need an FDG PET scan in the future   Essential Hypertension and Hypertensive Urgency  -Currently blood pressure is markedly elevated.   -Patient on nitroglycerin drip and will continue -Cardiology has been consulted and they are recommending continue Entresto 49 to 51 mg p.o. twice daily -Cardiology has now also changed to metoprolol succinate 50 mg p.o. daily carvedilol 25 mg p.o. twice daily given that her blood pressure is poorly controlled and are recommending titrating as needed for blood pressure control and maximizing GDMT -Her Imdur 60 mg p.o. 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   -Cardiology is going to interrogate ICD and recommending continuing Home Amiodarone 200 mg po Daily and are changing Metoprolol Succinate 50 mg po Daily to Carvedilol 25 mg po  BID   CKD Stage 3a Metabolic Acidosis -BUN/Cr Trend: Recent Labs  Lab 01/07/23 1605 01/08/23 0027  BUN 26* 26*  CREATININE 1.08* 1.12*  -Has a Mild Metabolic Acidosis with a CO2 of 20, Anion Gap of 14, Chloride level 105 -Avoid Nephrotoxic Medications, Contrast Dyes, Hypotension and Dehydration to Ensure Adequate Renal Perfusion and will need to Renally Adjust Meds -Continue to Monitor and Trend Renal Function carefully and repeat CMP in the AM   Hypokalemia -Patient's K+ Level Trend: Recent Labs  Lab 01/07/23 1605 01/08/23 0027  K 3.8 3.3*  -Replete with po KCL 40 mEQ BID x2 -Continue to Monitor and Replete as Necessary -Repeat CMP in the AM   Hyperlipidemia -Lipid panel done and showed a Total cholesterol/HDL ratio of 4.0, cholesterol level 267, HDL 66, LDL 161, triglycerides 199, VLDL 40 -C/w  Atorvastatin 80 mg p.o. nightly -Cardiology has added ezetimibe 10 mg p.o. daily and recommending repeating a fasting lipid panel and ALT in about 6 weeks  Obesity -Complicates overall prognosis and care -Estimated body mass index is 34.39 kg/m as calculated from the following:   Height as of this encounter: 5\' 1"  (1.549 m).   Weight as of this encounter: 82.6 kg.  -Weight Loss and Dietary Counseling given   DVT prophylaxis: Anticoagulated with enoxaparin subcu full dose    Code Status: Full Code Family Communication: Discussed with family at bedside  Disposition Plan:  Level of care: Telemetry Status is: Observation The patient will require care spanning > 2 midnights and should be moved to inpatient because: Needs further cardiac workup and clearance and will be undergoing a cardiac catheterization later today   Consultants:  Cardiology  Procedures:  Echocardiogram and left heart cath to be done later today  Antimicrobials:  Anti-infectives (From admission, onward)    None       Subjective: Seen and examined at bedside and states that she is having  intermittent chest pain but had no chest pain now and she is chest pain-free.  States it was a 2 out of 10 in severity earlier.  No nausea or vomiting but states that she had nausea and diaphoresis before she came in.  Denies any other concerns or complaints this time and stated that she did have a headache earlier to the nurse.  Did not mention anything to me.  Objective: Vitals:   01/08/23 0600 01/08/23 0615 01/08/23 0630 01/08/23 0645  BP: (!) 168/106 (!) 155/105 (!) 139/93 (!) 150/103  Pulse:      Resp: 14 14 15 18   Temp:      TempSrc:      SpO2:      Weight:      Height:        Intake/Output Summary (Last 24 hours) at 01/08/2023 1149 Last data filed at 01/08/2023 0600 Gross per 24 hour  Intake 331.58 ml  Output --  Net 331.58 ml   Filed Weights   01/07/23 1549  Weight: 82.6 kg   Examination: Physical Exam:  Constitutional: WN/WD obese African-American female who appears a little anxious Respiratory: Diminished to auscultation bilaterally, no wheezing, rales, rhonchi or crackles. Normal respiratory effort  and patient is not tachypenic. No accessory muscle use.  Unlabored breathing Cardiovascular: RRR, no murmurs / rubs / gallops. S1 and S2 auscultated. No extremity edema.  Abdomen: Soft, non-tender, distended secondary to body habitus. Bowel sounds positive.  GU: Deferred. Musculoskeletal: No clubbing / cyanosis of digits/nails. No joint deformity upper and lower extremities.  Skin: No rashes, lesions, ulcers limited skin evaluation. No induration; Warm and dry.  Neurologic: CN 2-12 grossly intact with no focal deficits. Romberg sign and cerebellar reflexes not assessed.  Psychiatric: Normal judgment and insight. Alert and oriented x 3.  She is little anxious mood and affect.  Data Reviewed: I have personally reviewed following labs and imaging studies  CBC: Recent Labs  Lab 01/07/23 1605 01/08/23 0028  WBC 7.8 7.5  HGB 14.7 13.6  HCT 47.6* 43.9  MCV 89.5 90.7   PLT 189 157   Basic Metabolic Panel: Recent Labs  Lab 01/07/23 1605 01/08/23 0027  NA 144 139  K 3.8 3.3*  CL 110 105  CO2 22 20*  GLUCOSE 98 115*  BUN 26* 26*  CREATININE 1.08* 1.12*  CALCIUM 9.7 9.3  MG  --  2.4  PHOS  --  2.7   GFR: Estimated Creatinine Clearance: 53.3 mL/min (A) (by C-G formula based on SCr of 1.12 mg/dL (H)). Liver Function Tests: Recent Labs  Lab 01/08/23 0027  AST 18  ALT 15  ALKPHOS 62  BILITOT 0.7  PROT 6.8  ALBUMIN 3.6   No results for input(s): "LIPASE", "AMYLASE" in the last 168 hours. No results for input(s): "AMMONIA" in the last 168 hours. Coagulation Profile: No results for input(s): "INR", "PROTIME" in the last 168 hours. Cardiac Enzymes: No results for input(s): "CKTOTAL", "CKMB", "CKMBINDEX", "TROPONINI" in the last 168 hours. BNP (last 3 results) No results for input(s): "PROBNP" in the last 8760 hours. HbA1C: No results for input(s): "HGBA1C" in the last 72 hours. CBG: No results for input(s): "GLUCAP" in the last 168 hours. Lipid Profile: Recent Labs    01/08/23 0028  CHOL 267*  HDL 66  LDLCALC 161*  TRIG 199*  CHOLHDL 4.0   Thyroid Function Tests: No results for input(s): "TSH", "T4TOTAL", "FREET4", "T3FREE", "THYROIDAB" in the last 72 hours. Anemia Panel: No results for input(s): "VITAMINB12", "FOLATE", "FERRITIN", "TIBC", "IRON", "RETICCTPCT" in the last 72 hours. Sepsis Labs: No results for input(s): "PROCALCITON", "LATICACIDVEN" in the last 168 hours.  No results found for this or any previous visit (from the past 240 hour(s)).   Radiology Studies: DG Chest Port 1 View  Result Date: 01/07/2023 CLINICAL DATA:  Chest pain. EXAM: PORTABLE CHEST 1 VIEW COMPARISON:  January 26, 2015 FINDINGS: There is a dual lead AICD with properly positioned lead wires noted. The heart size and mediastinal contours are within normal limits. Both lungs are clear. The visualized skeletal structures are unremarkable. IMPRESSION:  No active cardiopulmonary disease. Electronically Signed   By: Aram Candela M.D.   On: 01/07/2023 19:39    Scheduled Meds:  amiodarone  200 mg Oral Daily   aspirin EC  81 mg Oral Daily   atorvastatin  80 mg Oral QHS   [START ON 01/09/2023] carvedilol  25 mg Oral BID WC   dapagliflozin propanediol  10 mg Oral QAC breakfast   enoxaparin (LOVENOX) injection  80 mg Subcutaneous Q12H   ezetimibe  10 mg Oral Daily   ferrous sulfate  325 mg Oral q morning   furosemide  20 mg Oral Daily   gabapentin  600  mg Oral TID   influenza vac split trivalent PF  0.5 mL Intramuscular Tomorrow-1000   potassium chloride  40 mEq Oral BID   sacubitril-valsartan  1 tablet Oral Daily   venlafaxine  25 mg Oral Q1500   Continuous Infusions:  nitroGLYCERIN 50 mcg/min (01/08/23 0457)    LOS: 0 days   Marguerita Merles, DO [facility] Available via Epic secure chat 7am-7pm After these hours, please refer to coverage provider listed on [email or web address] 01/08/2023, 11:49 AM

## 2023-01-08 NOTE — H&P (View-Only) (Signed)
Cardiology Consultation   Patient ID: [name] MRN: [id number]; DOB: [birth year]  Admit date: 01/07/2023 Date of Consult: 01/08/2023  PCP:  Marie Parker, FNP   [facility] Providers Cardiologist:  [name], used to see [facility]   Patient Profile:   [name] is a 57 y.o. 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  History of Present Illness:   [name] with above PMH who presented to ER for chest pain last night.  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  She was resting during the day yesterday after working 2 jobs.  Around 2 PM, she experienced sudden onset of midsternal chest pain radiating to left side of the chest.  Pain was pressure-like, felt like someone is sitting on her chest.  She has not experienced similar symptom in the past.  She was in normal state of health until then.  She reports associated shortness of breath, diaphoresis, mild nausea without emesis, and left arm tingling sensation.  Symptom lasted for hours until she arrived to ER, she states symptoms resolved after she received nitroglycerin.  She is currently chest pain-free.  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  She denied any recent ICD shocks.  She smokes marijuana, denied any tobacco use or illicit drug use.  She drinks alcohol several times a week, average 2 shots of liquor each time.  She denied any history of major bleeding recently.  She is allergic to penicillin.  She reports that her grandparents both had heart attack in the past.  She reports no  significant weight gain recently, denied any orthopnea or lower extremity edema.  Admission labs showed Cr 1.08 and GFR 60. BNP 112. Hs trop 23 >26 >25. LDL 161, HDL 66, tri 199. CBC unremarkable.  Chest x-ray revealed no acute finding.  EKG with acute changes from previous EKG. Cardiology is consulted today for further evaluation of chest pain.  Per chart review, she used to follow [facility] cardiology but moved to [facility], recently established with [name] in September 10, 2022. She has long standing history of NICM with reduced LVEF dating back to 2018, felt due to HTN induced cardiomyopathy. Echo from 08/2016 with LVEF 25-30%. LHC from 09/15/16 showed no CAD. She suffered a witnessed cardiac arrest 05/20/22 due to monomorphic VT, hospitalized at [facility] 05/21/22 showed non-obstructive CAD (50% mid-RCA and 40% mid-Lcx). Echo 05/20/22 showed LVEF 25-30%, xtensive wall thinning and akinesis noted in the inferior-inferolateral and anterolateral segments. Cardiac MRI 05/21/22 showed severely dilated LV, with akinesis of the basal to mid inferolateral wall. LVEF 26 %. Normal RV.  LAE.No significant valvular abnormalities. There is basal anterior midwall stripe and basal inferoseptal midwall stripe which can be seen in prior myocarditis. There is base to mid inferolateral and lateral subendocardial LGE 25-50% likely from prior infarct in left circumflex territory.  Etiology of VT arrest was thought to be scar-mediated in the setting of NICM. She underwent ICD placement on 05/22/22, discharged on metoprolol XL and amiodarone.   She was last seen by [name] on 09/10/22 as  new patient, noted a significant discrepancy of her home meds list versus what she has been taking. She was taking metoprolol XL, losartan 50mg , farixga 5mg , amiodarone 200mg . She no longer takes spironolactone due to worsening renal index after 2 trial and was not taking Imdur/Coreg/nifedipine/lisinopril Interrogation of her device  09/10/22 showed that she has not had atrial or ventricular arrhythmias since device implantation. She has 33% atrial pacing and 99% effective biventricular pacing. All lead parameters were checked and they are within normal range. The left ventricular lead is programmed with an output of 1.75 V at 0.5 ms in the D1-M2 configuration, LV first by 60 ms. She was advised to increased metoprolol XL to 50mg  daily, increase farxiga to 10mg  daily, start entresto 49/51mg  BID, stop losartan, and reduce amiodarone to 200 mg daily.       Past Medical History:  Diagnosis Date   Abnormal TSH    10/2011 - additional hormones pending   Anxiety    Asthma    Atypical chest pain    recent low risk myoview   Coronary artery spasm (HCC)    a. NSTEMI 10/2011 with no CAD by cath but evidence of spasm when injecting left coronary artery.    Hyperlipidemia    Hypertension    LBBB (left bundle branch block)    Noncompliance    SVT (supraventricular tachycardia) (HCC) 10/22/10   wide complex tachycardia of same QRS as intinsic LBBB    Past Surgical History:  Procedure Laterality Date   LEFT HEART CATHETERIZATION WITH CORONARY ANGIOGRAM N/A 11/20/2011   Procedure: LEFT HEART CATHETERIZATION WITH CORONARY ANGIOGRAM;  Surgeon: Tonny Bollman, MD;  Location: [facility] CATH LAB;  Service: Cardiovascular;  Laterality: N/A;   TUBAL LIGATION       Home Medications:  Prior to Admission medications   Medication Sig Start Date End Date Taking? Authorizing Provider  solifenacin (VESICARE) 5 MG tablet Take 1 tablet by mouth daily. 12/23/22  Yes [provider]  acetaminophen (TYLENOL) 500 MG tablet Take 500 mg by mouth as needed. Patient not taking: Reported on 09/10/2022    [provider]  albuterol (PROVENTIL HFA;VENTOLIN HFA) 108 (90 BASE) MCG/ACT inhaler Inhale 2 puffs into the lungs every 6 (six) hours as needed. For wheezing    [provider]  amiodarone (PACERONE) 200 MG tablet Take 1 tablet (200  mg total) by mouth daily. 09/10/22   Croitoru, Mihai, MD  aspirin EC 81 MG tablet Take 81 mg by mouth daily. 04/22/22   [provider]  atorvastatin (LIPITOR) 10 MG tablet Take 1 tablet (10 mg total) by mouth daily. 03/09/15   Ward, Chase Picket, PA-C  cephALEXin (KEFLEX) 500 MG capsule Take 500 mg by mouth 2 (two) times daily. 05/23/22   [provider]  dapagliflozin propanediol (FARXIGA) 10 MG TABS tablet Take 1 tablet (10 mg total) by mouth daily before breakfast. 09/10/22   Croitoru, Mihai, MD  furosemide (LASIX) 20 MG tablet Take 20 mg by mouth daily.    [provider]  gabapentin (NEURONTIN) 600 MG tablet Take 600 mg by mouth 3 (three) times daily.    [provider]  ibuprofen (ADVIL,MOTRIN) 600 MG tablet Take 1 tablet (600 mg total) by mouth every 6 (six) hours as needed. 05/29/13   Nadara Mustard, MD  Iron 66 MG TABS Take 65 mg by mouth every morning.     [provider]  isosorbide mononitrate (IMDUR) 60 MG 24 hr tablet Take 1 tablet (60 mg total)  by mouth daily. 09/10/22   Croitoru, Mihai, MD  losartan (COZAAR) 50 MG tablet Take 50 mg by mouth daily.    [provider]  metoprolol succinate (TOPROL-XL) 50 MG 24 hr tablet Take 1 tablet (50 mg total) by mouth daily. Take with or immediately following a meal. 09/10/22   Croitoru, Mihai, MD  naphazoline-glycerin (CLEAR EYES) 0.012-0.2 % SOLN Place 1-2 drops into both eyes every 4 (four) hours as needed. For itchy eyes    [provider]  nitroGLYCERIN (NITROSTAT) 0.4 MG SL tablet Place 0.4 mg under the tongue every 5 (five) minutes as needed for chest pain.    [provider]  sacubitril-valsartan (ENTRESTO) 49-51 MG Take 1 tablet by mouth 2 (two) times daily. 09/10/22   Croitoru, Mihai, MD  venlafaxine (EFFEXOR) 25 MG tablet Take 25 mg by mouth daily in the afternoon. 08/24/20   [provider]    Inpatient Medications: Scheduled Meds:  amiodarone  200 mg Oral Daily    aspirin EC  81 mg Oral Daily   atorvastatin  10 mg Oral Daily   dapagliflozin propanediol  10 mg Oral QAC breakfast   enoxaparin (LOVENOX) injection  80 mg Subcutaneous Q12H   ferrous sulfate  325 mg Oral q morning   furosemide  20 mg Oral Daily   gabapentin  600 mg Oral TID   influenza vac split trivalent PF  0.5 mL Intramuscular Tomorrow-1000   isosorbide mononitrate  60 mg Oral Daily   metoprolol succinate  50 mg Oral Daily   sacubitril-valsartan  1 tablet Oral Daily   venlafaxine  25 mg Oral Q1500   Continuous Infusions:  nitroGLYCERIN 50 mcg/min (01/08/23 0457)   PRN Meds: acetaminophen, albuterol, nitroGLYCERIN, ondansetron (ZOFRAN) IV  Allergies:    Allergies  Allergen Reactions   Amoxicillin Hives   Penicillins Hives and Itching   Shellfish Allergy Other (See Comments)    "It makes my tongue swell and itch but I eat it anyway". She usually takes a Benadryl before eating it.    Social History:   Social History   Socioeconomic History   Marital status: Divorced    Spouse name: Not on file   Number of children: 3   Years of education: Not on file   Highest education level: Not on file  Occupational History    Comment: Unemployed  Tobacco Use   Smoking status: Every Day   Smokeless tobacco: Never   Tobacco comments:    Patient smokes 1 blunt daily  Substance and Sexual Activity   Alcohol use: Yes    Alcohol/week: 5.0 standard drinks of alcohol    Types: 5 Cans of beer per week    Comment: socially on weekends   Drug use: Yes    Types: Marijuana   Sexual activity: Yes    Birth control/protection: None  Other Topics Concern   Not on file  Social History Narrative   Lives in [place], unemployed.  Single.  3 children ages 77,24,28.         Social Determinants of Health   Financial Resource Strain: Not on File (11/18/2021)   Received from [employer]    Financial Resource Strain: 0  Food Insecurity: No Food Insecurity  (01/07/2023)   Hunger Vital Sign    Worried About Running Out of Food in the Last Year: Never true    Ran Out of Food in the Last Year: Never true  Transportation Needs: No Transportation Needs (01/07/2023)  PRAPARE - Administrator, Civil Service (Medical): No    Lack of Transportation (Non-Medical): No  Physical Activity: Not on File (11/18/2021)   Received from [facility]   Physical Activity    Physical Activity: 0  Recent Concern: Physical Activity - At Risk (11/18/2021)   Received from [facility] [place]   Physical Activity    Physical Activity: 2  Stress: Not on File (11/18/2021)   Received from [facility]   Stress    Stress: 0  Social Connections: Not on File (11/18/2021)   Received from [facility]   Social Connections    Connectedness: 0  Intimate Partner Violence: Not At Risk (01/07/2023)   Humiliation, Afraid, Rape, and Kick questionnaire    Fear of Current or Ex-Partner: No    Emotionally Abused: No    Physically Abused: No    Sexually Abused: No    Family History:    Family History  Problem Relation Age of Onset   Stroke Mother      ROS:  Constitutional: Denied fever, chills, malaise, night sweats Eyes: Denied vision change or loss Ears/Nose/Mouth/Throat: Denied ear ache, sore throat, coughing, sinus pain Cardiovascular: see HPI  Respiratory: see HPI  Gastrointestinal: Denied nausea, vomiting, abdominal pain, diarrhea Genital/Urinary: Denied dysuria, hematuria, urinary frequency/urgency Musculoskeletal: Denied muscle ache, joint pain, weakness Skin: Denied rash, wound Neuro: Denied headache, dizziness, syncope Psych: history of depression/anxiety  Endocrine: history of diabetes   Physical Exam/Data:   Vitals:   01/08/23 0600 01/08/23 0615 01/08/23 0630 01/08/23 0645  BP: (!) 168/106 (!) 155/105 (!) 139/93 (!) 150/103  Pulse:      Resp: 14 14 15 18   Temp:      TempSrc:      SpO2:      Weight:      Height:        Intake/Output Summary (Last 24 hours) at  01/08/2023 0737 Last data filed at 01/08/2023 0600 Gross per 24 hour  Intake 331.58 ml  Output --  Net 331.58 ml      01/07/2023    3:49 PM 09/10/2022    9:48 AM 05/23/2013   11:33 AM  Last 3 Weights  Weight (lbs) 182 lb 182 lb 6.4 oz 189 lb 9.5 oz  Weight (kg) 82.555 kg 82.736 kg 86 kg     Body mass index is 34.39 kg/m.  Vitals:  Vitals:   01/08/23 0630 01/08/23 0645  BP: (!) 139/93 (!) 150/103  Pulse:    Resp: 15 18  Temp:    SpO2:     General Appearance: In no apparent distress, laying in bed, obese HEENT: Normocephalic, atraumatic.  Neck: Supple, trachea midline, no JVDs Cardiovascular: Regular rate and rhythm, normal S1-S2,  no murmu Respiratory: Resting breathing unlabored, lungs sounds clear to auscultation bilaterally, no use of accessory muscles. On room air.  No wheezes, rales or rhonchi.   Gastrointestinal: Bowel sounds positive, abdomen soft, non-tender, non-distended.   Extremities: Able to move all extremities in bed without difficulty, no edema of BLE Musculoskeletal: Normal muscle bulk and tone Skin: Intact, warm, dry. No rashes or petechiae noted in exposed areas.  Neurologic: Alert, oriented to person, place and time. no gross focal neuro deficit Psychiatric: Normal affect. Mood is appropriate.    EKG:  The EKG was personally reviewed and demonstrates:    EKG from 01/07/23 showed sinus rhythm 61 bpm, first degree AVB, old LBBB, anterior Q, non-specific ST-T abnormalities; repeat EKGs from 01/07/23 without significant change   Telemetry:  Telemetry was personally reviewed and demonstrates:    Sinus rhythm, occasional PVCs   Relevant CV Studies:   Cardiac MRI 05/21/2022:  1.  LV is severely dilated with severe systolic dysfunction, with akinesis of the basal to mid inferolateral wall. LVEF 26 %  2.  RV normal size and systolic function.  3.  Left atrium is dilated.  4.  No significant valvular abnormalities.  5.  LGE: There is basal anterior  midwall stripe and basal inferoseptal midwall stripe which can be seen in prior myocarditis. There is base to mid inferolateral and lateral subendocardial LGE 25-50% likely from prior infarct in left circumflex territory. No evidence of active edema or inflammation.    Cardiac catheterization 09/15/16: Widely patent, although atretic, coronaries   Cardiac catheterization 05/21/2022 [facility]):   Non-obstructive coronary artery disease.  Coronary Angiography Findings  --LM  Minimal luminal irregularities  --LAD  Minimal luminal irregularities  --LCx  Mid 40%  --RCA  Mid 50%  --AV crossed  LVEDP  17 mmHg    TTE 04/23/2022: SUMMARY Mildly dilated LV with severe LV systolic dysfunction, EF 20-25%, with global hypokinesis except basal inferoposterior akinesis. Mild MR. Left atrial enlargement. Compared to prior, LVEF reduced.  TTE 05/31/2021: SUMMARY Low normal LV systolic function with EF50-55%. Basal inferoposterior hypokinesis. Diastolic dysfunction, stage 1. No significant valvular disease. Left atrial enlargement. No significant change from prior  NM myocardial perfusion stress test 05/31/2021: CONCLUSION: 1. No evidence of inducible ischemia. Favored attenuation artifact of the inferior wall. 2. Reduced left ventricular ejection fraction of 46%. 3. Mild global hypokinesia.   CRT-D implant 05/22/2022   Generator: Model Gallant HF  SN [id number]  RA lead: Model S475906  SN [id number]  RV lead: Model 7122Q  SN [id number]  LV lead: Model 1456Q  SN [id number]   RA: Threshold 0.9 V @ 1.0 ms; Impedance 447 ohms; P wave 4.8  mV  RV: Threshold 0.5 V @ 0.5 ms; Impedance 622 ohms; R wave 19.2 mV  LV: Threshold 1.7 V @ 0.5 ms; Impedance 1202 ohms; R wave 10.4 mV  (D1-M2, LV first 60 ms  Mode: DDD, 60-130  VT Monitor 150 bpm  VT-2 171 bpm, ATP x 4, 36, 40 J x 2  VF 214 bpm, ATP x 1, 36, 40 J x 5  Laboratory Data:  High Sensitivity Troponin:   Recent Labs  Lab 01/07/23 1605  01/07/23 2108 01/08/23 0028  TROPONINIHS 23* 26* 25*     Chemistry Recent Labs  Lab 01/07/23 1605  NA 144  K 3.8  CL 110  CO2 22  GLUCOSE 98  BUN 26*  CREATININE 1.08*  CALCIUM 9.7  GFRNONAA 60*  ANIONGAP 12    No results for input(s): "PROT", "ALBUMIN", "AST", "ALT", "ALKPHOS", "BILITOT" in the last 168 hours. Lipids  Recent Labs  Lab 01/08/23 0028  CHOL 267*  TRIG 199*  HDL 66  LDLCALC 161*  CHOLHDL 4.0    Hematology Recent Labs  Lab 01/07/23 1605 01/08/23 0028  WBC 7.8 7.5  RBC 5.32* 4.84  HGB 14.7 13.6  HCT 47.6* 43.9  MCV 89.5 90.7  MCH 27.6 28.1  MCHC 30.9 31.0  RDW 16.1* 15.9*  PLT 189 157   Thyroid No results for input(s): "TSH", "FREET4" in the last 168 hours.  BNP Recent Labs  Lab 01/07/23 1605  BNP 112.2*    DDimer No results for input(s): "DDIMER" in the last 168 hours.   Radiology/Studies:  [facility] Chest Port 1 View  Result  Date: 01/07/2023 CLINICAL DATA:  Chest pain. EXAM: PORTABLE CHEST 1 VIEW COMPARISON:  January 26, 2015 FINDINGS: There is a dual lead AICD with properly positioned lead wires noted. The heart size and mediastinal contours are within normal limits. Both lungs are clear. The visualized skeletal structures are unremarkable. IMPRESSION: No active cardiopulmonary disease. Electronically Signed   By: Aram Candela M.D.   On: 01/07/2023 19:39     Assessment and Plan:   Chest pain Non-obstructive CAD per LHC on 05/21/22  -Presented with new onset of resting chest pain on 01/07/2023 at 2 PM, pain relieved by nitroglycerin, associated with shortness of breath, diaphoresis, nausea, left arm paresthesia -High sensitive troponin flat at 20s  -EKG did not reveal acute changes -Echocardiogram is pending -LDL 161, triglyceride 199; A1C 6.1% on 11/04/22  -History concerning for unstable angina, although troponin trend is not impressive, clinical presentation can be due to hypertensive urgency versus coronary vasospasm as well, she  appears euvolemic on exam, agree with nitroglycerin gtt for now, if recurrent chest pain, please add heparin gtt - NPO except meds, potential LHC planned today (see attending addendum), stress Myoview can be another option although she is more leaning towards LHC  - Medical therapy: continue aspirin 81 mg daily, will increase Lipitor 10 mg to 80 mg daily, add Zetia 10 mg daily (LDL is not at goal), continue PTA metoprolol XL 50 mg daily, stop Imdur 60 mg daily as she is currently on nitroglycerin drip  NICM Chronic systolic heart failure  -Reports NYHA class I-II symptoms, overall stable -Will check BNP, chest x-ray admission without acute finding -Echocardiogram has been requested by primary team, pending -GDMT: Continue PTA metoprolol XL 50 mg daily, Entresto 49-51 mg twice daily, Farxiga 10 mg daily, will add spironolactone 12.5 mg daily today (history of worsening renal index with spironolactone in the past noted, will reattempt low dose, trend BMP)  CRT-D in-situ  Hx of monomorphic VT arrest 05/20/22  -Self reports no recent ICD shocks -Telemetry reveals sinus rhythm, no significant arrhythmia so far -Discussed with [employer], will interrogate ICD today  Hypertensive urgency  -Blood pressure remains elevated up to 180/120 ranges over the past 24 hours -Continue PTA metoprolol XL 50 mg daily, Entresto 49-51 mg twice daily, add spironolactone 12.5 mg daily, hold Imdur 60mg  daily, if remains elevated, would add amlodipine next (still on nitroglycerin gtt currently)  CKD IIIa -Renal index appears to be stable  Type 2 diabetes -Fair control according to labs from August 2024  Hyperlipidemia -Uncontrolled, increase Lipitor to 80 mg daily, add Zetia 10 mg daily       Risk Assessment/Risk Scores:    New York Heart Association (NYHA) Functional Class NYHA Class II        For questions or updates, please contact [facility] Please consult [email or web address]  for contact info under    Signed, Cyndi Bender, NP  01/08/2023 7:37 AM

## 2023-01-08 NOTE — Progress Notes (Signed)
LOCATION: right RADIAL  DEFLATED PER PROTOCOL: YES  TIME BAND OFF/DRESSING APPLIED: 1832, gauze with tegaderm  SITE UPON ARRIVAL: LEVEL 0  SITE AFTER BAND REMOVAL: LEVEL 0  CIRCULATION SENSATION AND MOVEMENT: +2 radial pulse, + movement  COMMENTS:

## 2023-01-08 NOTE — TOC Initial Note (Signed)
Transition of Care [facility]) - Initial/Assessment Note    Patient Details  Name: [name] MRN: [id number] Date of Birth: [birth year]  Transition of Care [facility]) CM/SW Contact:    Lanier Clam, RN Phone Number: 01/08/2023, 9:21 AM  Clinical Narrative:  d/c plan home.                 Expected Discharge Plan: Home/Self Care Barriers to Discharge: Continued Medical Work up   Patient Goals and CMS Choice Patient states their goals for this hospitalization and ongoing recovery are:: Home CMS Medicare.gov Compare Post Acute Care list provided to:: Patient Choice offered to / list presented to : Patient Prue ownership interest in [facility].provided to:: Patient    Expected Discharge Plan and Services   Discharge Planning Services: CM Consult   Living arrangements for the past 2 months: Single Family Home                                      Prior Living Arrangements/Services Living arrangements for the past 2 months: Single Family Home Lives with:: Relatives Patient language and need for interpreter reviewed:: Yes Do you feel safe going back to the place where you live?: Yes      Need for Family Participation in Patient Care: Yes (Comment) Care giver support system in place?: Yes (comment)   Criminal Activity/Legal Involvement Pertinent to Current Situation/Hospitalization: No - Comment as needed  Activities of Daily Living   ADL Screening (condition at time of admission) Independently performs ADLs?: Yes (appropriate for developmental age) Is the patient deaf or have difficulty hearing?: No Does the patient have difficulty seeing, even when wearing glasses/contacts?: No Does the patient have difficulty concentrating, remembering, or making decisions?: No  Permission Sought/Granted Permission sought to share information with : Case Manager Permission granted to share information with : Yes, Verbal Permission Granted  Share Information with  NAME: Case Manager           Emotional Assessment Appearance:: Appears stated age Attitude/Demeanor/Rapport: Gracious Affect (typically observed): Accepting Orientation: : Oriented to Self, Oriented to Place, Oriented to  Time, Oriented to Situation Alcohol / Substance Use: Not Applicable Psych Involvement: No (comment)  Admission diagnosis:  Unstable angina (HCC) [I20.0] Patient Active Problem List   Diagnosis Date Noted   Unstable angina (HCC) 01/07/2023   Chronic combined systolic and diastolic congestive heart failure (HCC) 09/10/2022   Ventricular tachycardia (HCC) 09/10/2022   Biventricular ICD (implantable cardioverter-defibrillator) in place 09/10/2022   Coronary artery disease involving native coronary artery of native heart with angina pectoris with documented spasm (HCC) 09/10/2022   Stage 3a chronic kidney disease (HCC) 09/10/2022   Type 2 diabetes mellitus with complication, without long-term current use of insulin (HCC) 09/10/2022   Hypercholesterolemia 09/10/2022   Cardiac arrest with ventricular fibrillation (HCC) 09/09/2022   Hypertensive emergency 04/22/2022   Anxiety and depression 12/16/2021   Spondylolisthesis of lumbar region 11/22/2020   Neurogenic claudication 10/28/2019   Lumbar radiculopathy 10/28/2019   Rotator cuff arthropathy of right shoulder 08/18/2018   Piriformis syndrome of right side 04/28/2018   Vitamin D deficiency 03/29/2018   Disc disease with myelopathy, lumbar 03/29/2018   Chronic right-sided low back pain with right-sided sciatica 05/25/2017   Mild intermittent asthma without complication 09/29/2016   Chest pain 05/23/2013   ETOH abuse 10/27/2011   Atypical chest pain 03/03/2011   SVT (supraventricular tachycardia) (HCC)  11/07/2010   Left bundle branch block 11/07/2010   TRICHINOSIS 01/22/2007   ANEMIA-IRON DEFICIENCY 01/22/2007   HYPERTENSION 01/22/2007   ASTHMA 01/22/2007   PELVIC INFLAMMATORY DISEASE 01/22/2007   PCP:  Corrin Parker, FNP Pharmacy:   [id number] - [place], [place] - [address] [place] [address] Phone: [phone] Fax: [phone]     Social Determinants of Health (SDOH) Social History: SDOH Screenings   Food Insecurity: No Food Insecurity (01/07/2023)  Housing: Low Risk  (01/07/2023)  Transportation Needs: No Transportation Needs (01/07/2023)  Utilities: Not At Risk (01/07/2023)  Financial Resource Strain: Not on File (11/18/2021)   Received from [facility]  Physical Activity: Not on File (11/18/2021)   Received from [facility]  Recent Concern: Physical Activity - At Risk (11/18/2021)   Received from [place], [place]  Social Connections: Not on File (11/18/2021)   Received from [facility]  Stress: Not on File (11/18/2021)   Received from [facility]  Tobacco Use: High Risk (01/07/2023)   SDOH Interventions:     Readmission Risk Interventions     No data to display

## 2023-01-08 NOTE — Hospital Course (Addendum)
The patient is an obese African-American female with past medical history significant for but not limited to a left bundle branch block, obesity, chronic CHF, coronary artery spasms, body disorder, asthma, hypertension, hyperlipidemia as well as other comorbidities presented to the ED with central chest pain that began on Friday.  She rated as a 9 out of 10 in initial severity and responded well to nitroglycerin as well as morphine.  Chest pain is now gone and she is still pain-free but states it is very intermittent.  EKG was nondiagnostic and the case was discussed with cardiology who evaluated and recommended admission with heparin and nitroglycerin however she is placed on Lovenox instead.  Cardiology has been consulted and they have made some medication adjustments and now recommending a left heart catheterization to define her anatomy and have initiated her on GDMT and have ordered a repeat echocardiogram.  Left heart catheterization was done and only showed that the proximal RCA lesion was 40% stenosis in the mid circumflex lesion was 40% stenosed.  The mild coronary artery disease was unchanged from previous study.  Cardiology recommended medical therapy.  Echocardiogram showed an EF of 35 to 40% and grade 2 diastolic dysfunction.  Cardiology noted the changes from her prior study and felt that the left ventricle is less dilated with wall motion abnormalities being similar.  Recommended aggressive blood pressure control and felt that that her chest pain and elevated troponins were in the setting of her hypertensive urgency.  They are recommending continue aggressive blood pressure control continue GDMT for dilated cardiomyopathy.  She was put back on her medications and weaned off of the nitro drip and stabilized for discharge.  Cardiology signed off and they will need to follow-up with her within 1 to 2 weeks and repeat CMP and fasting lipid panel ALT in 8 weeks.  Assessment and Plan:  Chest Pain r/o  ACS  -Patient has nonobstructive coronary artery disease from 2013 cardiac cath.   -She has intermittent chest pain and she does have risk factors for Coronary Artery Disease.   -Troponin Trend Relatively Flat and went from 23 -> 26 -> 25 -Admitted the patient for observation.  -She has been placed on a nitroglycerin drip and cardiology feels that her symptoms were concerning enough given the they are resolved in setting of nitro drip.  They suspected demand ischemia in setting of hypertensive urgency to coronary artery vasospasm spasm and not ACS but recommending to proceed with left heart catheter to redefine her coronary artery to make sure her RCA lesion has not progressed  -C/w ASA 81 mg po daily, IV Nitro gtt. Full dose Lovenox subcutaneous, Atorvastatin 80 mg po daily -Cardiology changing Metoprolol Succinate 50 mg po Daily to Carvedilol 25 mg po BID -ECHOCardiogram also being ordered -Further care per Cardiology and she will go for a Left Heart Cath which showed no change in CAD from the prior study and the recommendation was for medical therapy and follow-up with outpatient cardiology   Chronic Combined Systolic and Diastolic Heart Failure and Hx of Nonischemic Cardiomyopathy -Per Cardiology she has a NYHA Class I-II -Appears compensated and Euvolemic at this time.   -C/w Furosemide 20 mg po Daily, Entresto 49-51 mg po BID, and Farxiga 10 mg po Daily -Repeat echocardiogram done and showed an EF of 35 to 40% with grade 2 diastolic dysfunction -BNP was 295.6 and repeat was 156. -Cardiology changing Metoprolol Succinate 50 mg po Daily to Carvedilol 25 mg po BID -Strict I's and O's  and Daily Weights; No intake or output data in the 24 hours ending 01/11/23 2121 -Cardiology ordering ECHOCardiogram -Cardiology not going to start her on Spironolactone given that she has been tried in the past with both occasions causing worsening of her Renal Fxn -Continue to Monitor for S/Sx of Volume  Overload -Per Cardiology she may need an FDG PET scan in the future however they are just recommending GDMT and blood pressure control outpatient setting   Essential Hypertension and Hypertensive Urgency  -Currently blood pressure is markedly elevated.   -Patient on nitroglycerin drip and will continue -Cardiology has been consulted and they are recommending continue Entresto 49 to 51 mg p.o. twice daily -Cardiology has now also changed to metoprolol succinate 50 mg p.o. daily carvedilol 25 mg p.o. twice daily given that her blood pressure is poorly controlled and are recommending titrating as needed for blood pressure control and maximizing GDMT -Her Imdur 60 mg p.o. 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   -Cardiology is going to interrogate ICD and recommending continuing Home Amiodarone 200 mg po Daily and are changing Metoprolol Succinate 50 mg po Daily to Carvedilol 25 mg po BID   CKD Stage 3a Metabolic Acidosis -BUN/Cr Trend: Recent Labs  Lab 01/07/23 1605 01/08/23 0027  BUN 26* 26*  CREATININE 1.08* 1.12*  -Has a Mild Metabolic Acidosis with a CO2 of 20, Anion Gap of 14, Chloride level 105 -Avoid Nephrotoxic Medications, Contrast Dyes, Hypotension and Dehydration to Ensure Adequate Renal Perfusion and will need to Renally Adjust Meds -Continue to Monitor and Trend Renal Function carefully and repeat within 1 week  Hypokalemia -Patient's K+ Level Trend: Recent Labs  Lab 01/07/23 1605 01/08/23 0027 01/08/23 1535 01/08/23 1536 01/08/23 1539  K 3.8 3.3* 3.5 3.4* 3.3*  -Replete with po KCL 40 mEQ BID x2 -Continue to Monitor and Replete as  Necessary -Repeat CMP in the AM   Hyperlipidemia -Lipid panel done and showed a Total cholesterol/HDL ratio of 4.0, cholesterol level 267, HDL 66, LDL 161, triglycerides 199, VLDL 40 -C/w  Atorvastatin 80 mg p.o. nightly -Cardiology has added ezetimibe 10 mg p.o. daily and recommending repeating a fasting lipid panel and ALT in about 6 weeks  Obesity -Complicates overall prognosis and care -Estimated body mass index is 34.39 kg/m as calculated from the following:   Height as of this encounter: 5\' 1"  (1.549 m).   Weight as of this encounter: 82.6 kg.  -Weight Loss and Dietary Counseling given

## 2023-01-08 NOTE — Progress Notes (Signed)
Pt arrive back to unit from [facility] cath lab via [facility]

## 2023-01-08 NOTE — Consult Note (Addendum)
Cardiology Consultation   Patient ID: [name] MRN: [id number]; DOB: [birth year]  Admit date: 01/07/2023 Date of Consult: 01/08/2023  PCP:  Marie Parker, FNP   [facility] Providers Cardiologist:  Dr Royann Shivers, used to see [facility]   Patient Profile:   [name] is a 57 y.o. 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  History of Present Illness:   [name] with above PMH who presented to ER for chest pain last night.  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  She was resting during the day yesterday after working 2 jobs.  Around 2 PM, she experienced sudden onset of midsternal chest pain radiating to left side of the chest.  Pain was pressure-like, felt like someone is sitting on her chest.  She has not experienced similar symptom in the past.  She was in normal state of health until then.  She reports associated shortness of breath, diaphoresis, mild nausea without emesis, and left arm tingling sensation.  Symptom lasted for hours until she arrived to ER, she states symptoms resolved after she received nitroglycerin.  She is currently chest pain-free.  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  She denied any recent ICD shocks.  She smokes marijuana, denied any tobacco use or illicit drug use.  She drinks alcohol several times a week, average 2 shots of liquor each time.  She denied any history of major bleeding recently.  She is allergic to penicillin.  She reports that her grandparents both had heart attack in the past.  She reports no  significant weight gain recently, denied any orthopnea or lower extremity edema.  Admission labs showed Cr 1.08 and GFR 60. BNP 112. Hs trop 23 >26 >25. LDL 161, HDL 66, tri 199. CBC unremarkable.  Chest x-ray revealed no acute finding.  EKG with acute changes from previous EKG. Cardiology is consulted today for further evaluation of chest pain.  Per chart review, she used to follow [facility] cardiology but moved to [facility], recently established with Dr Royann Shivers in September 10, 2022. She has long standing history of NICM with reduced LVEF dating back to 2018, felt due to HTN induced cardiomyopathy. Echo from 08/2016 with LVEF 25-30%. LHC from 09/15/16 showed no CAD. She suffered a witnessed cardiac arrest 05/20/22 due to monomorphic VT, hospitalized at [facility] 05/21/22 showed non-obstructive CAD (50% mid-RCA and 40% mid-Lcx). Echo 05/20/22 showed LVEF 25-30%, xtensive wall thinning and akinesis noted in the inferior-inferolateral and anterolateral segments. Cardiac MRI 05/21/22 showed severely dilated LV, with akinesis of the basal to mid inferolateral wall. LVEF 26 %. Normal RV.  LAE.No significant valvular abnormalities. There is basal anterior midwall stripe and basal inferoseptal midwall stripe which can be seen in prior myocarditis. There is base to mid inferolateral and lateral subendocardial LGE 25-50% likely from prior infarct in left circumflex territory.  Etiology of VT arrest was thought to be scar-mediated in the setting of NICM. She underwent ICD placement on 05/22/22, discharged on metoprolol XL and amiodarone.   She was last seen by Dr Royann Shivers on 09/10/22 as  new patient, noted a significant discrepancy of her home meds list versus what she has been taking. She was taking metoprolol XL, losartan 50mg , farixga 5mg , amiodarone 200mg . She no longer takes spironolactone due to worsening renal index after 2 trial and was not taking Imdur/Coreg/nifedipine/lisinopril Interrogation of her device  09/10/22 showed that she has not had atrial or ventricular arrhythmias since device implantation. She has 33% atrial pacing and 99% effective biventricular pacing. All lead parameters were checked and they are within normal range. The left ventricular lead is programmed with an output of 1.75 V at 0.5 ms in the D1-M2 configuration, LV first by 60 ms. She was advised to increased metoprolol XL to 50mg  daily, increase farxiga to 10mg  daily, start entresto 49/51mg  BID, stop losartan, and reduce amiodarone to 200 mg daily.       Past Medical History:  Diagnosis Date   Abnormal TSH    10/2011 - additional hormones pending   Anxiety    Asthma    Atypical chest pain    recent low risk myoview   Coronary artery spasm (HCC)    a. NSTEMI 10/2011 with no CAD by cath but evidence of spasm when injecting left coronary artery.    Hyperlipidemia    Hypertension    LBBB (left bundle branch block)    Noncompliance    SVT (supraventricular tachycardia) (HCC) 10/22/10   wide complex tachycardia of same QRS as intinsic LBBB    Past Surgical History:  Procedure Laterality Date   LEFT HEART CATHETERIZATION WITH CORONARY ANGIOGRAM N/A 11/20/2011   Procedure: LEFT HEART CATHETERIZATION WITH CORONARY ANGIOGRAM;  Surgeon: Tonny Bollman, MD;  Location: [facility] CATH LAB;  Service: Cardiovascular;  Laterality: N/A;   TUBAL LIGATION       Home Medications:  Prior to Admission medications   Medication Sig Start Date End Date Taking? Authorizing Provider  solifenacin (VESICARE) 5 MG tablet Take 1 tablet by mouth daily. 12/23/22  Yes [provider]  acetaminophen (TYLENOL) 500 MG tablet Take 500 mg by mouth as needed. Patient not taking: Reported on 09/10/2022    [provider]  albuterol (PROVENTIL HFA;VENTOLIN HFA) 108 (90 BASE) MCG/ACT inhaler Inhale 2 puffs into the lungs every 6 (six) hours as needed. For wheezing    [provider]  amiodarone (PACERONE) 200 MG tablet Take 1 tablet (200  mg total) by mouth daily. 09/10/22   Croitoru, Mihai, MD  aspirin EC 81 MG tablet Take 81 mg by mouth daily. 04/22/22   [provider]  atorvastatin (LIPITOR) 10 MG tablet Take 1 tablet (10 mg total) by mouth daily. 03/09/15   Ward, Chase Picket, PA-C  cephALEXin (KEFLEX) 500 MG capsule Take 500 mg by mouth 2 (two) times daily. 05/23/22   [provider]  dapagliflozin propanediol (FARXIGA) 10 MG TABS tablet Take 1 tablet (10 mg total) by mouth daily before breakfast. 09/10/22   Croitoru, Mihai, MD  furosemide (LASIX) 20 MG tablet Take 20 mg by mouth daily.    [provider]  gabapentin (NEURONTIN) 600 MG tablet Take 600 mg by mouth 3 (three) times daily.    [provider]  ibuprofen (ADVIL,MOTRIN) 600 MG tablet Take 1 tablet (600 mg total) by mouth every 6 (six) hours as needed. 05/29/13   Nadara Mustard, MD  Iron 66 MG TABS Take 65 mg by mouth every morning.     [provider]  isosorbide mononitrate (IMDUR) 60 MG 24 hr tablet Take 1 tablet (60 mg total)  by mouth daily. 09/10/22   Croitoru, Mihai, MD  losartan (COZAAR) 50 MG tablet Take 50 mg by mouth daily.    [provider]  metoprolol succinate (TOPROL-XL) 50 MG 24 hr tablet Take 1 tablet (50 mg total) by mouth daily. Take with or immediately following a meal. 09/10/22   Croitoru, Mihai, MD  naphazoline-glycerin (CLEAR EYES) 0.012-0.2 % SOLN Place 1-2 drops into both eyes every 4 (four) hours as needed. For itchy eyes    [provider]  nitroGLYCERIN (NITROSTAT) 0.4 MG SL tablet Place 0.4 mg under the tongue every 5 (five) minutes as needed for chest pain.    [provider]  sacubitril-valsartan (ENTRESTO) 49-51 MG Take 1 tablet by mouth 2 (two) times daily. 09/10/22   Croitoru, Mihai, MD  venlafaxine (EFFEXOR) 25 MG tablet Take 25 mg by mouth daily in the afternoon. 08/24/20   [provider]    Inpatient Medications: Scheduled Meds:  amiodarone  200 mg Oral Daily    aspirin EC  81 mg Oral Daily   atorvastatin  10 mg Oral Daily   dapagliflozin propanediol  10 mg Oral QAC breakfast   enoxaparin (LOVENOX) injection  80 mg Subcutaneous Q12H   ferrous sulfate  325 mg Oral q morning   furosemide  20 mg Oral Daily   gabapentin  600 mg Oral TID   influenza vac split trivalent PF  0.5 mL Intramuscular Tomorrow-1000   isosorbide mononitrate  60 mg Oral Daily   metoprolol succinate  50 mg Oral Daily   sacubitril-valsartan  1 tablet Oral Daily   venlafaxine  25 mg Oral Q1500   Continuous Infusions:  nitroGLYCERIN 50 mcg/min (01/08/23 0457)   PRN Meds: acetaminophen, albuterol, nitroGLYCERIN, ondansetron (ZOFRAN) IV  Allergies:    Allergies  Allergen Reactions   Amoxicillin Hives   Penicillins Hives and Itching   Shellfish Allergy Other (See Comments)    "It makes my tongue swell and itch but I eat it anyway". She usually takes a Benadryl before eating it.    Social History:   Social History   Socioeconomic History   Marital status: Divorced    Spouse name: Not on file   Number of children: 3   Years of education: Not on file   Highest education level: Not on file  Occupational History    Comment: Unemployed  Tobacco Use   Smoking status: Every Day   Smokeless tobacco: Never   Tobacco comments:    Patient smokes 1 blunt daily  Substance and Sexual Activity   Alcohol use: Yes    Alcohol/week: 5.0 standard drinks of alcohol    Types: 5 Cans of beer per week    Comment: socially on weekends   Drug use: Yes    Types: Marijuana   Sexual activity: Yes    Birth control/protection: None  Other Topics Concern   Not on file  Social History Narrative   Lives in [place], unemployed.  Single.  3 children ages 77,24,28.         Social Determinants of Health   Financial Resource Strain: Not on File (11/18/2021)   Received from [employer]    Financial Resource Strain: 0  Food Insecurity: No Food Insecurity  (01/07/2023)   Hunger Vital Sign    Worried About Running Out of Food in the Last Year: Never true    Ran Out of Food in the Last Year: Never true  Transportation Needs: No Transportation Needs (01/07/2023)  PRAPARE - Administrator, Civil Service (Medical): No    Lack of Transportation (Non-Medical): No  Physical Activity: Not on File (11/18/2021)   Received from [facility]   Physical Activity    Physical Activity: 0  Recent Concern: Physical Activity - At Risk (11/18/2021)   Received from [facility] [place]   Physical Activity    Physical Activity: 2  Stress: Not on File (11/18/2021)   Received from [facility]   Stress    Stress: 0  Social Connections: Not on File (11/18/2021)   Received from [facility]   Social Connections    Connectedness: 0  Intimate Partner Violence: Not At Risk (01/07/2023)   Humiliation, Afraid, Rape, and Kick questionnaire    Fear of Current or Ex-Partner: No    Emotionally Abused: No    Physically Abused: No    Sexually Abused: No    Family History:    Family History  Problem Relation Age of Onset   Stroke Mother      ROS:  Constitutional: Denied fever, chills, malaise, night sweats Eyes: Denied vision change or loss Ears/Nose/Mouth/Throat: Denied ear ache, sore throat, coughing, sinus pain Cardiovascular: see HPI  Respiratory: see HPI  Gastrointestinal: Denied nausea, vomiting, abdominal pain, diarrhea Genital/Urinary: Denied dysuria, hematuria, urinary frequency/urgency Musculoskeletal: Denied muscle ache, joint pain, weakness Skin: Denied rash, wound Neuro: Denied headache, dizziness, syncope Psych: history of depression/anxiety  Endocrine: history of diabetes   Physical Exam/Data:   Vitals:   01/08/23 0600 01/08/23 0615 01/08/23 0630 01/08/23 0645  BP: (!) 168/106 (!) 155/105 (!) 139/93 (!) 150/103  Pulse:      Resp: 14 14 15 18   Temp:      TempSrc:      SpO2:      Weight:      Height:        Intake/Output Summary (Last 24 hours) at  01/08/2023 0737 Last data filed at 01/08/2023 0600 Gross per 24 hour  Intake 331.58 ml  Output --  Net 331.58 ml      01/07/2023    3:49 PM 09/10/2022    9:48 AM 05/23/2013   11:33 AM  Last 3 Weights  Weight (lbs) 182 lb 182 lb 6.4 oz 189 lb 9.5 oz  Weight (kg) 82.555 kg 82.736 kg 86 kg     Body mass index is 34.39 kg/m.  Vitals:  Vitals:   01/08/23 0630 01/08/23 0645  BP: (!) 139/93 (!) 150/103  Pulse:    Resp: 15 18  Temp:    SpO2:     General Appearance: In no apparent distress, laying in bed, obese HEENT: Normocephalic, atraumatic.  Neck: Supple, trachea midline, no JVDs Cardiovascular: Regular rate and rhythm, normal S1-S2,  no murmu Respiratory: Resting breathing unlabored, lungs sounds clear to auscultation bilaterally, no use of accessory muscles. On room air.  No wheezes, rales or rhonchi.   Gastrointestinal: Bowel sounds positive, abdomen soft, non-tender, non-distended.   Extremities: Able to move all extremities in bed without difficulty, no edema of BLE Musculoskeletal: Normal muscle bulk and tone Skin: Intact, warm, dry. No rashes or petechiae noted in exposed areas.  Neurologic: Alert, oriented to person, place and time. no gross focal neuro deficit Psychiatric: Normal affect. Mood is appropriate.    EKG:  The EKG was personally reviewed and demonstrates:    EKG from 01/07/23 showed sinus rhythm 61 bpm, first degree AVB, old LBBB, anterior Q, non-specific ST-T abnormalities; repeat EKGs from 01/07/23 without significant change   Telemetry:  Telemetry was personally reviewed and demonstrates:    Sinus rhythm, occasional PVCs   Relevant CV Studies:   Cardiac MRI 05/21/2022:  1.  LV is severely dilated with severe systolic dysfunction, with akinesis of the basal to mid inferolateral wall. LVEF 26 %  2.  RV normal size and systolic function.  3.  Left atrium is dilated.  4.  No significant valvular abnormalities.  5.  LGE: There is basal anterior  midwall stripe and basal inferoseptal midwall stripe which can be seen in prior myocarditis. There is base to mid inferolateral and lateral subendocardial LGE 25-50% likely from prior infarct in left circumflex territory. No evidence of active edema or inflammation.    Cardiac catheterization 09/15/16: Widely patent, although atretic, coronaries   Cardiac catheterization 05/21/2022 [facility]):   Non-obstructive coronary artery disease.  Coronary Angiography Findings  --LM  Minimal luminal irregularities  --LAD  Minimal luminal irregularities  --LCx  Mid 40%  --RCA  Mid 50%  --AV crossed  LVEDP  17 mmHg    TTE 04/23/2022: SUMMARY Mildly dilated LV with severe LV systolic dysfunction, EF 20-25%, with global hypokinesis except basal inferoposterior akinesis. Mild MR. Left atrial enlargement. Compared to prior, LVEF reduced.  TTE 05/31/2021: SUMMARY Low normal LV systolic function with EF50-55%. Basal inferoposterior hypokinesis. Diastolic dysfunction, stage 1. No significant valvular disease. Left atrial enlargement. No significant change from prior  NM myocardial perfusion stress test 05/31/2021: CONCLUSION: 1. No evidence of inducible ischemia. Favored attenuation artifact of the inferior wall. 2. Reduced left ventricular ejection fraction of 46%. 3. Mild global hypokinesia.   CRT-D implant 05/22/2022   Generator: Model Gallant HF  SN [id number]  RA lead: Model S475906  SN [id number]  RV lead: Model 7122Q  SN [id number]  LV lead: Model 1456Q  SN [id number]   RA: Threshold 0.9 V @ 1.0 ms; Impedance 447 ohms; P wave 4.8  mV  RV: Threshold 0.5 V @ 0.5 ms; Impedance 622 ohms; R wave 19.2 mV  LV: Threshold 1.7 V @ 0.5 ms; Impedance 1202 ohms; R wave 10.4 mV  (D1-M2, LV first 60 ms  Mode: DDD, 60-130  VT Monitor 150 bpm  VT-2 171 bpm, ATP x 4, 36, 40 J x 2  VF 214 bpm, ATP x 1, 36, 40 J x 5  Laboratory Data:  High Sensitivity Troponin:   Recent Labs  Lab 01/07/23 1605  01/07/23 2108 01/08/23 0028  TROPONINIHS 23* 26* 25*     Chemistry Recent Labs  Lab 01/07/23 1605  NA 144  K 3.8  CL 110  CO2 22  GLUCOSE 98  BUN 26*  CREATININE 1.08*  CALCIUM 9.7  GFRNONAA 60*  ANIONGAP 12    No results for input(s): "PROT", "ALBUMIN", "AST", "ALT", "ALKPHOS", "BILITOT" in the last 168 hours. Lipids  Recent Labs  Lab 01/08/23 0028  CHOL 267*  TRIG 199*  HDL 66  LDLCALC 161*  CHOLHDL 4.0    Hematology Recent Labs  Lab 01/07/23 1605 01/08/23 0028  WBC 7.8 7.5  RBC 5.32* 4.84  HGB 14.7 13.6  HCT 47.6* 43.9  MCV 89.5 90.7  MCH 27.6 28.1  MCHC 30.9 31.0  RDW 16.1* 15.9*  PLT 189 157   Thyroid No results for input(s): "TSH", "FREET4" in the last 168 hours.  BNP Recent Labs  Lab 01/07/23 1605  BNP 112.2*    DDimer No results for input(s): "DDIMER" in the last 168 hours.   Radiology/Studies:  [facility] Chest Port 1 View  Result  Date: 01/07/2023 CLINICAL DATA:  Chest pain. EXAM: PORTABLE CHEST 1 VIEW COMPARISON:  January 26, 2015 FINDINGS: There is a dual lead AICD with properly positioned lead wires noted. The heart size and mediastinal contours are within normal limits. Both lungs are clear. The visualized skeletal structures are unremarkable. IMPRESSION: No active cardiopulmonary disease. Electronically Signed   By: Aram Candela M.D.   On: 01/07/2023 19:39     Assessment and Plan:   Chest pain Non-obstructive CAD per LHC on 05/21/22  -Presented with new onset of resting chest pain on 01/07/2023 at 2 PM, pain relieved by nitroglycerin, associated with shortness of breath, diaphoresis, nausea, left arm paresthesia -High sensitive troponin flat at 20s  -EKG did not reveal acute changes -Echocardiogram is pending -LDL 161, triglyceride 199; A1C 6.1% on 11/04/22  -History concerning for unstable angina, although troponin trend is not impressive, clinical presentation can be due to hypertensive urgency versus coronary vasospasm as well, she  appears euvolemic on exam, agree with nitroglycerin gtt for now, if recurrent chest pain, please add heparin gtt - NPO except meds, potential LHC planned today (see attending addendum), stress Myoview can be another option although she is more leaning towards LHC  - Medical therapy: continue aspirin 81 mg daily, will increase Lipitor 10 mg to 80 mg daily, add Zetia 10 mg daily (LDL is not at goal), continue PTA metoprolol XL 50 mg daily, stop Imdur 60 mg daily as she is currently on nitroglycerin drip  NICM Chronic systolic heart failure  -Reports NYHA class I-II symptoms, overall stable -Will check BNP, chest x-ray admission without acute finding -Echocardiogram has been requested by primary team, pending -GDMT: Continue PTA metoprolol XL 50 mg daily, Entresto 49-51 mg twice daily, Farxiga 10 mg daily, will add spironolactone 12.5 mg daily today (history of worsening renal index with spironolactone in the past noted, will reattempt low dose, trend BMP)  CRT-D in-situ  Hx of monomorphic VT arrest 05/20/22  -Self reports no recent ICD shocks -Telemetry reveals sinus rhythm, no significant arrhythmia so far -Discussed with [employer], will interrogate ICD today  Hypertensive urgency  -Blood pressure remains elevated up to 180/120 ranges over the past 24 hours -Continue PTA metoprolol XL 50 mg daily, Entresto 49-51 mg twice daily, add spironolactone 12.5 mg daily, hold Imdur 60mg  daily, if remains elevated, would add amlodipine next (still on nitroglycerin gtt currently)  CKD IIIa -Renal index appears to be stable  Type 2 diabetes -Fair control according to labs from August 2024  Hyperlipidemia -Uncontrolled, increase Lipitor to 80 mg daily, add Zetia 10 mg daily       Risk Assessment/Risk Scores:    New York Heart Association (NYHA) Functional Class NYHA Class II        For questions or updates, please contact [facility] Please consult [email or web address]  for contact info under    Signed, Cyndi Bender, NP  01/08/2023 7:37 AM

## 2023-01-09 DIAGNOSIS — Z95810 Presence of automatic (implantable) cardiac defibrillator: Secondary | ICD-10-CM | POA: Diagnosis not present

## 2023-01-09 DIAGNOSIS — I5042 Chronic combined systolic (congestive) and diastolic (congestive) heart failure: Secondary | ICD-10-CM | POA: Diagnosis not present

## 2023-01-09 DIAGNOSIS — E7800 Pure hypercholesterolemia, unspecified: Secondary | ICD-10-CM | POA: Diagnosis not present

## 2023-01-09 DIAGNOSIS — I200 Unstable angina: Secondary | ICD-10-CM | POA: Diagnosis not present

## 2023-01-09 MED ORDER — HYDRALAZINE HCL 20 MG/ML IJ SOLN: 10.0000 mg | Freq: Four times a day (QID) | INTRAMUSCULAR | Status: DC | PRN

## 2023-01-09 MED ORDER — DAPAGLIFLOZIN PROPANEDIOL 10 MG PO TABS
10.0000 mg | ORAL_TABLET | Freq: Every day | ORAL | 0 refills | Status: AC
Start: 2023-01-09 — End: ?

## 2023-01-09 MED ORDER — HYDRALAZINE HCL 20 MG/ML IJ SOLN
5.0000 mg | Freq: Four times a day (QID) | INTRAMUSCULAR | Status: DC | PRN
  Administered 2023-01-09: 5 mg via INTRAVENOUS
  Filled 2023-01-09: qty 1

## 2023-01-09 MED ORDER — FERROUS SULFATE 325 (65 FE) MG PO TABS: 325.0000 mg | ORAL_TABLET | Freq: Every morning | ORAL | 0 refills | Status: AC

## 2023-01-09 MED ORDER — EZETIMIBE 10 MG PO TABS: 10.0000 mg | ORAL_TABLET | Freq: Every day | ORAL | 0 refills | Status: AC

## 2023-01-09 MED ORDER — CARVEDILOL 25 MG PO TABS: 25.0000 mg | ORAL_TABLET | Freq: Two times a day (BID) | ORAL | 0 refills | Status: DC

## 2023-01-09 MED ORDER — ISOSORBIDE MONONITRATE ER 60 MG PO TB24: 60.0000 mg | ORAL_TABLET | Freq: Every day | ORAL | 0 refills | Status: AC

## 2023-01-09 NOTE — Progress Notes (Signed)
Receive report from Konrad Penta, RN. Nitroglycerin infusing at 20 mcg/min, verified with outgoing RN.

## 2023-01-09 NOTE — Progress Notes (Signed)
Heart Failure Navigator Progress Note  Assessed for Heart & Vascular TOC clinic readiness.  Patient per MD admitted with Hypertensive urgency, EF 35-40%, BNP 156, Has a scheduled [facility] follow up on 01/19/2023. .   Navigator will sign off at this time.   Rhae Hammock, BSN, Scientist, clinical (histocompatibility and immunogenetics) Only

## 2023-01-09 NOTE — Plan of Care (Signed)
  Problem: Education: Goal: Understanding of cardiac disease, CV risk reduction, and recovery process will improve Outcome: Progressing   Problem: Clinical Measurements: Goal: Cardiovascular complication will be avoided Outcome: Progressing   Problem: Activity: Goal: Risk for activity intolerance will decrease Outcome: Progressing   Problem: Coping: Goal: Level of anxiety will decrease Outcome: Progressing   Problem: Pain Managment: Goal: General experience of comfort will improve Outcome: Progressing

## 2023-01-09 NOTE — Discharge Summary (Signed)
Discharge Exam: Filed Weights   01/07/23 1549  Weight: 82.6 kg   Vitals:   01/09/23 0633 01/09/23 0850  BP: (!) 167/109   Pulse:    Resp:    Temp:  98.2 F (36.8 C)  SpO2:     Examination: Physical Exam:  Constitutional: WN/WD African-American female in no acute distress Respiratory: Diminished to auscultation bilaterally, no wheezing, rales, rhonchi or crackles. Normal respiratory effort and patient is not tachypenic. No accessory muscle use.  Unlabored breathing Cardiovascular: RRR, no murmurs / rubs / gallops. S1 and S2 auscultated.  Minimal extremity edema Abdomen: Soft, non-tender, distended secondary to body habitus. Bowel sounds positive.  GU: Deferred. Musculoskeletal: No clubbing / cyanosis of digits/nails. No joint deformity upper and lower extremities.  Skin: No rashes, lesions, ulcers on limited skin evaluation. No induration; Warm and dry.  Neurologic: CN 2-12 grossly intact with no focal deficits. Romberg sign and cerebellar reflexes not assessed.  Psychiatric: Normal judgment and insight. Alert and oriented x 3. Normal mood and appropriate affect.   Condition at discharge: stable  The results of significant diagnostics from this hospitalization (including imaging, microbiology, ancillary and laboratory) are listed below for reference.    Imaging Studies: CARDIAC CATHETERIZATION  Result Date: 01/08/2023   Prox RCA lesion is 40% stenosed.   Mid Cx lesion is 40% stenosed. 1.  Right radial arterial loop requiring balloon assisted tracking to negotiate. 2.  Mild coronary artery disease unchanged from previous study (at [facility]). 3.  Fick cardiac output of 4.8 L/min and Fick cardiac index of 2.7 liters per minute per meter squared with the following hemodynamics:  Right atrial pressure mean of 3 mmHg  Right ventricular pressure 31/0 with an end-diastolic pressure of 8 mmHg  Wedge pressure mean of 10 mmHg  Pulmonary artery pressure of 33/12 with a mean of 18 mmHg  PVR of 2.9 Woods units  PA pulsatility index of 6.3 Recommendation: Medical therapy   ECHOCARDIOGRAM COMPLETE  Result Date: 01/08/2023    ECHOCARDIOGRAM REPORT   Patient Name:   [name] Date of Exam: 01/08/2023 Medical Rec #:  [id number]         Height:       61.0 in Accession #:    [id number]        Weight:       182.0 lb Date of Birth:  1965/07/31         BSA:          1.815 m Patient Age:    57 years          BP:           130/104 mmHg Patient Gender: F                 HR:           63 bpm. Exam Location:  Inpatient Procedure: 2D Echo, Color Doppler and Cardiac Doppler Indications:    Chest Pain R07.9  History:        Patient has prior history of Echocardiogram examinations, most                 recent 10/28/2022. Arrythmias:SVT; Risk Factors:Hypertension and                 Dyslipidemia.  Sonographer:    Harriette Bouillon RDCS Referring Phys: [id number] MOHAMMAD L GARBA IMPRESSIONS  1. Left ventricular ejection fraction, by estimation, is 35 to 40%. The left ventricle has mild to moderately decreased function. The  Physician Discharge Summary   Patient: [name] MRN: [id number] DOB: Aug 13, 1965  Admit date:     01/07/2023  Discharge date: 01/09/23  Discharge Physician: Marguerita Merles, DO   PCP: Corrin Parker, FNP   Recommendations at discharge:   Follow-up with PCP within 1 to 2 weeks and repeat CBC, CMP, mag, Phos within 1 week Follow-up with Cardiology in outpatient setting with Dr. Royann Shivers  Discharge Diagnoses: Principal Problem:   Unstable angina (HCC) Active Problems:   HYPERTENSION   Chronic combined systolic and diastolic congestive heart failure (HCC)   Biventricular ICD (implantable cardioverter-defibrillator) in place   Stage 3a chronic kidney disease (HCC)   Hypercholesterolemia  Resolved Problems:   * No resolved hospital problems. [facility] Course: The patient is an obese African-American female with past medical history significant for but not limited to a left bundle branch block, obesity, chronic CHF, coronary artery spasms, body disorder, asthma, hypertension, hyperlipidemia as well as other comorbidities presented to the ED with central chest pain that began on Friday.  She rated as a 9 out of 10 in initial severity and responded well to nitroglycerin as well as morphine.  Chest pain is now gone and she is still pain-free but states it is very intermittent.  EKG was nondiagnostic and the case was discussed with cardiology who evaluated and recommended admission with heparin and nitroglycerin however she is placed on Lovenox instead.  Cardiology has been consulted and they have made some medication adjustments and now recommending a left heart catheterization to define her anatomy and have initiated her on GDMT and have ordered a repeat echocardiogram.  Left heart catheterization was done and only showed that the proximal RCA lesion was 40% stenosis in the mid circumflex lesion was 40% stenosed.  The mild coronary artery disease was unchanged from previous study.   Cardiology recommended medical therapy.  Echocardiogram showed an EF of 35 to 40% and grade 2 diastolic dysfunction.  Cardiology noted the changes from her prior study and felt that the left ventricle is less dilated with wall motion abnormalities being similar.  Recommended aggressive blood pressure control and felt that that her chest pain and elevated troponins were in the setting of her hypertensive urgency.  They are recommending continue aggressive blood pressure control continue GDMT for dilated cardiomyopathy.  She was put back on her medications and weaned off of the nitro drip and stabilized for discharge.  Cardiology signed off and they will need to follow-up with her within 1 to 2 weeks and repeat CMP and fasting lipid panel ALT in 8 weeks.  Assessment and Plan:  Chest Pain r/o ACS  -Patient has nonobstructive coronary artery disease from 2013 cardiac cath.   -She has intermittent chest pain and she does have risk factors for Coronary Artery Disease.   -Troponin Trend Relatively Flat and went from 23 -> 26 -> 25 -Admitted the patient for observation.  -She has been placed on a nitroglycerin drip and cardiology feels that her symptoms were concerning enough given the they are resolved in setting of nitro drip.  They suspected demand ischemia in setting of hypertensive urgency to coronary artery vasospasm spasm and not ACS but recommending to proceed with left heart catheter to redefine her coronary artery to make sure her RCA lesion has not progressed  -C/w ASA 81 mg po daily, IV Nitro gtt. Full dose Lovenox subcutaneous, Atorvastatin 80 mg po daily -Cardiology changing Metoprolol Succinate 50 mg po Daily to Carvedilol 25 mg po  Physician Discharge Summary   Patient: [name] MRN: [id number] DOB: Aug 13, 1965  Admit date:     01/07/2023  Discharge date: 01/09/23  Discharge Physician: Marguerita Merles, DO   PCP: Corrin Parker, FNP   Recommendations at discharge:   Follow-up with PCP within 1 to 2 weeks and repeat CBC, CMP, mag, Phos within 1 week Follow-up with Cardiology in outpatient setting with Dr. Royann Shivers  Discharge Diagnoses: Principal Problem:   Unstable angina (HCC) Active Problems:   HYPERTENSION   Chronic combined systolic and diastolic congestive heart failure (HCC)   Biventricular ICD (implantable cardioverter-defibrillator) in place   Stage 3a chronic kidney disease (HCC)   Hypercholesterolemia  Resolved Problems:   * No resolved hospital problems. [facility] Course: The patient is an obese African-American female with past medical history significant for but not limited to a left bundle branch block, obesity, chronic CHF, coronary artery spasms, body disorder, asthma, hypertension, hyperlipidemia as well as other comorbidities presented to the ED with central chest pain that began on Friday.  She rated as a 9 out of 10 in initial severity and responded well to nitroglycerin as well as morphine.  Chest pain is now gone and she is still pain-free but states it is very intermittent.  EKG was nondiagnostic and the case was discussed with cardiology who evaluated and recommended admission with heparin and nitroglycerin however she is placed on Lovenox instead.  Cardiology has been consulted and they have made some medication adjustments and now recommending a left heart catheterization to define her anatomy and have initiated her on GDMT and have ordered a repeat echocardiogram.  Left heart catheterization was done and only showed that the proximal RCA lesion was 40% stenosis in the mid circumflex lesion was 40% stenosed.  The mild coronary artery disease was unchanged from previous study.   Cardiology recommended medical therapy.  Echocardiogram showed an EF of 35 to 40% and grade 2 diastolic dysfunction.  Cardiology noted the changes from her prior study and felt that the left ventricle is less dilated with wall motion abnormalities being similar.  Recommended aggressive blood pressure control and felt that that her chest pain and elevated troponins were in the setting of her hypertensive urgency.  They are recommending continue aggressive blood pressure control continue GDMT for dilated cardiomyopathy.  She was put back on her medications and weaned off of the nitro drip and stabilized for discharge.  Cardiology signed off and they will need to follow-up with her within 1 to 2 weeks and repeat CMP and fasting lipid panel ALT in 8 weeks.  Assessment and Plan:  Chest Pain r/o ACS  -Patient has nonobstructive coronary artery disease from 2013 cardiac cath.   -She has intermittent chest pain and she does have risk factors for Coronary Artery Disease.   -Troponin Trend Relatively Flat and went from 23 -> 26 -> 25 -Admitted the patient for observation.  -She has been placed on a nitroglycerin drip and cardiology feels that her symptoms were concerning enough given the they are resolved in setting of nitro drip.  They suspected demand ischemia in setting of hypertensive urgency to coronary artery vasospasm spasm and not ACS but recommending to proceed with left heart catheter to redefine her coronary artery to make sure her RCA lesion has not progressed  -C/w ASA 81 mg po daily, IV Nitro gtt. Full dose Lovenox subcutaneous, Atorvastatin 80 mg po daily -Cardiology changing Metoprolol Succinate 50 mg po Daily to Carvedilol 25 mg po  Discharge Exam: Filed Weights   01/07/23 1549  Weight: 82.6 kg   Vitals:   01/09/23 0633 01/09/23 0850  BP: (!) 167/109   Pulse:    Resp:    Temp:  98.2 F (36.8 C)  SpO2:     Examination: Physical Exam:  Constitutional: WN/WD African-American female in no acute distress Respiratory: Diminished to auscultation bilaterally, no wheezing, rales, rhonchi or crackles. Normal respiratory effort and patient is not tachypenic. No accessory muscle use.  Unlabored breathing Cardiovascular: RRR, no murmurs / rubs / gallops. S1 and S2 auscultated.  Minimal extremity edema Abdomen: Soft, non-tender, distended secondary to body habitus. Bowel sounds positive.  GU: Deferred. Musculoskeletal: No clubbing / cyanosis of digits/nails. No joint deformity upper and lower extremities.  Skin: No rashes, lesions, ulcers on limited skin evaluation. No induration; Warm and dry.  Neurologic: CN 2-12 grossly intact with no focal deficits. Romberg sign and cerebellar reflexes not assessed.  Psychiatric: Normal judgment and insight. Alert and oriented x 3. Normal mood and appropriate affect.   Condition at discharge: stable  The results of significant diagnostics from this hospitalization (including imaging, microbiology, ancillary and laboratory) are listed below for reference.    Imaging Studies: CARDIAC CATHETERIZATION  Result Date: 01/08/2023   Prox RCA lesion is 40% stenosed.   Mid Cx lesion is 40% stenosed. 1.  Right radial arterial loop requiring balloon assisted tracking to negotiate. 2.  Mild coronary artery disease unchanged from previous study (at [facility]). 3.  Fick cardiac output of 4.8 L/min and Fick cardiac index of 2.7 liters per minute per meter squared with the following hemodynamics:  Right atrial pressure mean of 3 mmHg  Right ventricular pressure 31/0 with an end-diastolic pressure of 8 mmHg  Wedge pressure mean of 10 mmHg  Pulmonary artery pressure of 33/12 with a mean of 18 mmHg  PVR of 2.9 Woods units  PA pulsatility index of 6.3 Recommendation: Medical therapy   ECHOCARDIOGRAM COMPLETE  Result Date: 01/08/2023    ECHOCARDIOGRAM REPORT   Patient Name:   [name] Date of Exam: 01/08/2023 Medical Rec #:  [id number]         Height:       61.0 in Accession #:    [id number]        Weight:       182.0 lb Date of Birth:  1965/07/31         BSA:          1.815 m Patient Age:    57 years          BP:           130/104 mmHg Patient Gender: F                 HR:           63 bpm. Exam Location:  Inpatient Procedure: 2D Echo, Color Doppler and Cardiac Doppler Indications:    Chest Pain R07.9  History:        Patient has prior history of Echocardiogram examinations, most                 recent 10/28/2022. Arrythmias:SVT; Risk Factors:Hypertension and                 Dyslipidemia.  Sonographer:    Harriette Bouillon RDCS Referring Phys: [id number] MOHAMMAD L GARBA IMPRESSIONS  1. Left ventricular ejection fraction, by estimation, is 35 to 40%. The left ventricle has mild to moderately decreased function. The  Physician Discharge Summary   Patient: [name] MRN: [id number] DOB: Aug 13, 1965  Admit date:     01/07/2023  Discharge date: 01/09/23  Discharge Physician: Marguerita Merles, DO   PCP: Corrin Parker, FNP   Recommendations at discharge:   Follow-up with PCP within 1 to 2 weeks and repeat CBC, CMP, mag, Phos within 1 week Follow-up with Cardiology in outpatient setting with Dr. Royann Shivers  Discharge Diagnoses: Principal Problem:   Unstable angina (HCC) Active Problems:   HYPERTENSION   Chronic combined systolic and diastolic congestive heart failure (HCC)   Biventricular ICD (implantable cardioverter-defibrillator) in place   Stage 3a chronic kidney disease (HCC)   Hypercholesterolemia  Resolved Problems:   * No resolved hospital problems. [facility] Course: The patient is an obese African-American female with past medical history significant for but not limited to a left bundle branch block, obesity, chronic CHF, coronary artery spasms, body disorder, asthma, hypertension, hyperlipidemia as well as other comorbidities presented to the ED with central chest pain that began on Friday.  She rated as a 9 out of 10 in initial severity and responded well to nitroglycerin as well as morphine.  Chest pain is now gone and she is still pain-free but states it is very intermittent.  EKG was nondiagnostic and the case was discussed with cardiology who evaluated and recommended admission with heparin and nitroglycerin however she is placed on Lovenox instead.  Cardiology has been consulted and they have made some medication adjustments and now recommending a left heart catheterization to define her anatomy and have initiated her on GDMT and have ordered a repeat echocardiogram.  Left heart catheterization was done and only showed that the proximal RCA lesion was 40% stenosis in the mid circumflex lesion was 40% stenosed.  The mild coronary artery disease was unchanged from previous study.   Cardiology recommended medical therapy.  Echocardiogram showed an EF of 35 to 40% and grade 2 diastolic dysfunction.  Cardiology noted the changes from her prior study and felt that the left ventricle is less dilated with wall motion abnormalities being similar.  Recommended aggressive blood pressure control and felt that that her chest pain and elevated troponins were in the setting of her hypertensive urgency.  They are recommending continue aggressive blood pressure control continue GDMT for dilated cardiomyopathy.  She was put back on her medications and weaned off of the nitro drip and stabilized for discharge.  Cardiology signed off and they will need to follow-up with her within 1 to 2 weeks and repeat CMP and fasting lipid panel ALT in 8 weeks.  Assessment and Plan:  Chest Pain r/o ACS  -Patient has nonobstructive coronary artery disease from 2013 cardiac cath.   -She has intermittent chest pain and she does have risk factors for Coronary Artery Disease.   -Troponin Trend Relatively Flat and went from 23 -> 26 -> 25 -Admitted the patient for observation.  -She has been placed on a nitroglycerin drip and cardiology feels that her symptoms were concerning enough given the they are resolved in setting of nitro drip.  They suspected demand ischemia in setting of hypertensive urgency to coronary artery vasospasm spasm and not ACS but recommending to proceed with left heart catheter to redefine her coronary artery to make sure her RCA lesion has not progressed  -C/w ASA 81 mg po daily, IV Nitro gtt. Full dose Lovenox subcutaneous, Atorvastatin 80 mg po daily -Cardiology changing Metoprolol Succinate 50 mg po Daily to Carvedilol 25 mg po  Discharge Exam: Filed Weights   01/07/23 1549  Weight: 82.6 kg   Vitals:   01/09/23 0633 01/09/23 0850  BP: (!) 167/109   Pulse:    Resp:    Temp:  98.2 F (36.8 C)  SpO2:     Examination: Physical Exam:  Constitutional: WN/WD African-American female in no acute distress Respiratory: Diminished to auscultation bilaterally, no wheezing, rales, rhonchi or crackles. Normal respiratory effort and patient is not tachypenic. No accessory muscle use.  Unlabored breathing Cardiovascular: RRR, no murmurs / rubs / gallops. S1 and S2 auscultated.  Minimal extremity edema Abdomen: Soft, non-tender, distended secondary to body habitus. Bowel sounds positive.  GU: Deferred. Musculoskeletal: No clubbing / cyanosis of digits/nails. No joint deformity upper and lower extremities.  Skin: No rashes, lesions, ulcers on limited skin evaluation. No induration; Warm and dry.  Neurologic: CN 2-12 grossly intact with no focal deficits. Romberg sign and cerebellar reflexes not assessed.  Psychiatric: Normal judgment and insight. Alert and oriented x 3. Normal mood and appropriate affect.   Condition at discharge: stable  The results of significant diagnostics from this hospitalization (including imaging, microbiology, ancillary and laboratory) are listed below for reference.    Imaging Studies: CARDIAC CATHETERIZATION  Result Date: 01/08/2023   Prox RCA lesion is 40% stenosed.   Mid Cx lesion is 40% stenosed. 1.  Right radial arterial loop requiring balloon assisted tracking to negotiate. 2.  Mild coronary artery disease unchanged from previous study (at [facility]). 3.  Fick cardiac output of 4.8 L/min and Fick cardiac index of 2.7 liters per minute per meter squared with the following hemodynamics:  Right atrial pressure mean of 3 mmHg  Right ventricular pressure 31/0 with an end-diastolic pressure of 8 mmHg  Wedge pressure mean of 10 mmHg  Pulmonary artery pressure of 33/12 with a mean of 18 mmHg  PVR of 2.9 Woods units  PA pulsatility index of 6.3 Recommendation: Medical therapy   ECHOCARDIOGRAM COMPLETE  Result Date: 01/08/2023    ECHOCARDIOGRAM REPORT   Patient Name:   [name] Date of Exam: 01/08/2023 Medical Rec #:  [id number]         Height:       61.0 in Accession #:    [id number]        Weight:       182.0 lb Date of Birth:  1965/07/31         BSA:          1.815 m Patient Age:    57 years          BP:           130/104 mmHg Patient Gender: F                 HR:           63 bpm. Exam Location:  Inpatient Procedure: 2D Echo, Color Doppler and Cardiac Doppler Indications:    Chest Pain R07.9  History:        Patient has prior history of Echocardiogram examinations, most                 recent 10/28/2022. Arrythmias:SVT; Risk Factors:Hypertension and                 Dyslipidemia.  Sonographer:    Harriette Bouillon RDCS Referring Phys: [id number] MOHAMMAD L GARBA IMPRESSIONS  1. Left ventricular ejection fraction, by estimation, is 35 to 40%. The left ventricle has mild to moderately decreased function. The

## 2023-01-10 LAB — LIPOPROTEIN A (LPA): Lipoprotein (a): 189.8 nmol/L — ABNORMAL HIGH (ref <75.0)

## 2023-01-12 NOTE — Plan of Care (Signed)
[facility] Tonsillectomy/Adenoidectomy, Postoperative PEDS care plan entered in error.

## 2023-01-18 ENCOUNTER — Emergency Department (HOSPITAL_BASED_OUTPATIENT_CLINIC_OR_DEPARTMENT_OTHER): Payer: Medicaid Other

## 2023-01-18 ENCOUNTER — Encounter (HOSPITAL_COMMUNITY): Payer: Self-pay

## 2023-01-18 ENCOUNTER — Emergency Department (HOSPITAL_COMMUNITY)
Admission: EM | Admit: 2023-01-18 | Discharge: 2023-01-18 | Disposition: A | Discharge status: Home / Self Care | Payer: Medicaid Other | Attending: Emergency Medicine | Admitting: Emergency Medicine

## 2023-01-18 ENCOUNTER — Emergency Department (HOSPITAL_COMMUNITY): Payer: Medicaid Other

## 2023-01-18 ENCOUNTER — Other Ambulatory Visit: Payer: Self-pay

## 2023-01-18 DIAGNOSIS — M79661 Pain in right lower leg: Secondary | ICD-10-CM

## 2023-01-18 DIAGNOSIS — I2510 Atherosclerotic heart disease of native coronary artery without angina pectoris: Secondary | ICD-10-CM | POA: Insufficient documentation

## 2023-01-18 DIAGNOSIS — J45909 Unspecified asthma, uncomplicated: Secondary | ICD-10-CM | POA: Insufficient documentation

## 2023-01-18 DIAGNOSIS — I509 Heart failure, unspecified: Secondary | ICD-10-CM | POA: Insufficient documentation

## 2023-01-18 DIAGNOSIS — I110 Hypertensive heart disease with heart failure: Secondary | ICD-10-CM | POA: Insufficient documentation

## 2023-01-18 DIAGNOSIS — M25561 Pain in right knee: Secondary | ICD-10-CM | POA: Insufficient documentation

## 2023-01-18 DIAGNOSIS — M79604 Pain in right leg: Secondary | ICD-10-CM | POA: Insufficient documentation

## 2023-01-18 DIAGNOSIS — Z7982 Long term (current) use of aspirin: Secondary | ICD-10-CM | POA: Diagnosis not present

## 2023-01-18 DIAGNOSIS — Z79899 Other long term (current) drug therapy: Secondary | ICD-10-CM | POA: Diagnosis not present

## 2023-01-18 MED ORDER — METHYLPREDNISOLONE 4 MG PO TBPK: ORAL_TABLET | ORAL | 0 refills | Status: DC

## 2023-01-18 NOTE — Progress Notes (Signed)
Right lower ext venous  has been completed. Refer to [facility] under chart review to view preliminary results.   01/18/2023  11:02 AM [name], Gerarda Gunther

## 2023-01-18 NOTE — ED Triage Notes (Signed)
Pt arrived POV d.t right knee pain for 2 days atraumatic. [name]  Pt is up all day on her feet at work.

## 2023-01-18 NOTE — ED Provider Notes (Signed)
[place] EMERGENCY DEPARTMENT AT [facility] Provider Note   CSN: [id number] Arrival date & time: 01/18/23  1478     History  Chief Complaint  Patient presents with   Knee Pain    [name] is a 58 y.o. female with history of anxiety, hypertension, SVT, left bundle branch block, hyperlipidemia, asthma, coronary artery spasm, NSTEMI, who presents the emergency department complaining of right knee and leg pain for the past 2 days.  Patient denies any injury to the area.  States that pain mainly started in the front of her knee, but now she is feeling upper thigh and her calf.  Her knee has been very swollen, but this has improved a little bit. She has taken tylenol and gabapentin without relief. 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  Denies any recent long travel or periods of immobilization.  She does not smoke.  No history of blood clots.  No chest pain or shortness of breath.   Knee Pain      Home Medications Prior to Admission medications   Medication Sig Start Date End Date Taking? Authorizing Provider  methylPREDNISolone (MEDROL DOSEPAK) 4 MG TBPK tablet Take per package instructions 01/18/23  Yes Wynee Matarazzo T, PA-C  albuterol (PROVENTIL HFA;VENTOLIN HFA) 108 (90 BASE) MCG/ACT inhaler Inhale 2 puffs into the lungs every 6 (six) hours as needed for wheezing or shortness of breath. For wheezing    [provider]  amiodarone (PACERONE) 200 MG tablet Take 1 tablet (200 mg total) by mouth daily. Patient taking differently: Take 200 mg by mouth 2 (two) times daily. 09/10/22   Croitoru, Mihai, MD  aspirin EC 81 MG tablet Take 81 mg by mouth daily. 04/22/22   [provider]  atorvastatin (LIPITOR) 80 MG tablet Take 80 mg by mouth at bedtime.    [provider]  carvedilol (COREG) 25 MG tablet Take 1 tablet (25 mg total) by mouth 2 (two) times daily with a meal. 01/09/23   Sheikh, Kateri Mc Latif, DO  dapagliflozin  propanediol (FARXIGA) 10 MG TABS tablet Take 1 tablet (10 mg total) by mouth daily before breakfast. 01/09/23   Marguerita Merles Latif, DO  ezetimibe (ZETIA) 10 MG tablet Take 1 tablet (10 mg total) by mouth daily. 01/10/23   Marguerita Merles Latif, DO  ferrous sulfate 325 (65 FE) MG tablet Take 1 tablet (325 mg total) by mouth every morning. 01/10/23   Marguerita Merles Latif, DO  furosemide (LASIX) 20 MG tablet Take 20 mg by mouth daily.    [provider]  gabapentin (NEURONTIN) 600 MG tablet Take 600 mg by mouth 2 (two) times daily.    [provider]  isosorbide mononitrate (IMDUR) 60 MG 24 hr tablet Take 1 tablet (60 mg total) by mouth daily. 01/09/23   Marguerita Merles Latif, DO  nitroGLYCERIN (NITROSTAT) 0.4 MG SL tablet Place 0.4 mg under the tongue every 5 (five) minutes as needed for chest pain.    [provider]  sacubitril-valsartan (ENTRESTO) 49-51 MG Take 1 tablet by mouth 2 (two) times daily. 09/10/22   Croitoru, Mihai, MD  solifenacin (VESICARE) 5 MG tablet Take 1 tablet by mouth daily. 12/23/22   [provider]      Allergies    Amoxicillin, Penicillins, and Shellfish allergy    Review of Systems   Review of Systems  Musculoskeletal:  Positive for arthralgias, joint swelling and myalgias.  All other systems reviewed and are negative.  Physical Exam Updated Vital Signs BP (!) 136/91   Pulse 61   Temp 97.9 F (36.6 C)   Resp 16   Ht 5\' 1"  (1.549 m)   Wt 82.6 kg   SpO2 100%   BMI 34.39 kg/m  Physical Exam Vitals and nursing note reviewed.  Constitutional:      Appearance: Normal appearance.  HENT:     Head: Normocephalic and atraumatic.  Eyes:     Conjunctiva/sclera: Conjunctivae normal.  Pulmonary:     Effort: Pulmonary effort is normal. No respiratory distress.  Musculoskeletal:     Comments: Tenderness to palpation of the anterior right knee and right calf.  No appreciable swelling.  Compartments are soft.  No overlying skin  changes.  Skin:    General: Skin is warm and dry.  Neurological:     Mental Status: She is alert.  Psychiatric:        Mood and Affect: Mood normal.        Behavior: Behavior normal.     ED Results / Procedures / Treatments   Labs (all labs ordered are listed, but only abnormal results are displayed) Labs Reviewed - No data to display  EKG None  Radiology VAS [place] LOWER EXTREMITY VENOUS (DVT) (7a-7p)  Result Date: 01/18/2023  Lower Venous DVT Study Patient Name:  [name]  Date of Exam:   01/18/2023 Medical Rec #: [id number]          Accession #:    [id number] Date of Birth: [birth year]          Patient Gender: F Patient Age:   55 years Exam Location:  [facility] Procedure:      VAS [place] LOWER EXTREMITY VENOUS (DVT) Referring Phys: Griffin Basil Oluwademilade Kellett --------------------------------------------------------------------------------  Indications: Pain in right leg and knee for 2 days.  Comparison Study: No priors. Performing Technologist: Marilynne Halsted RDMS, RVT  Examination Guidelines: A complete evaluation includes B-mode imaging, spectral Doppler, color Doppler, and power Doppler as needed of all accessible portions of each vessel. Bilateral testing is considered an integral part of a complete examination. Limited examinations for reoccurring indications may be performed as noted. The reflux portion of the exam is performed with the patient in reverse Trendelenburg.  +---------+---------------+---------+-----------+----------+--------------+ RIGHT    CompressibilityPhasicitySpontaneityPropertiesThrombus Aging +---------+---------------+---------+-----------+----------+--------------+ CFV      Full           Yes      Yes                                 +---------+---------------+---------+-----------+----------+--------------+ SFJ      Full                                                         +---------+---------------+---------+-----------+----------+--------------+ FV Prox  Full                                                        +---------+---------------+---------+-----------+----------+--------------+ FV Mid   Full                                                        +---------+---------------+---------+-----------+----------+--------------+  FV DistalFull                                                        +---------+---------------+---------+-----------+----------+--------------+ PFV      Full                                                        +---------+---------------+---------+-----------+----------+--------------+ POP      Full           Yes      Yes                                 +---------+---------------+---------+-----------+----------+--------------+ PTV      Full                                                        +---------+---------------+---------+-----------+----------+--------------+ PERO     Full                                                        +---------+---------------+---------+-----------+----------+--------------+   +----+---------------+---------+-----------+----------+--------------+ LEFTCompressibilityPhasicitySpontaneityPropertiesThrombus Aging +----+---------------+---------+-----------+----------+--------------+ CFV Full           Yes      Yes                                 +----+---------------+---------+-----------+----------+--------------+ SFJ Full                                                        +----+---------------+---------+-----------+----------+--------------+     Summary: RIGHT: - There is no evidence of deep vein thrombosis in the lower extremity.  - No cystic structure found in the popliteal fossa.  LEFT: - No evidence of common femoral vein obstruction.   *See table(s) above for measurements and observations.    Preliminary    DG Knee 2 Views Right  Result  Date: 01/18/2023 CLINICAL DATA:  57 year old female with right knee pain and swelling for 2 days. EXAM: RIGHT KNEE - 1-2 VIEW COMPARISON:  None Available. FINDINGS: Two views at 0724 hours. Bone mineralization is within normal limits. Normal joint spaces and alignment for age. Patella appears intact. No acute osseous abnormality identified. Evidence of a small suprapatellar joint effusion. Questionable medial and anterior soft tissue swelling. No soft tissue gas. No radiopaque foreign body identified. IMPRESSION: No osseous abnormality identified about the right knee. Possible small joint effusion and soft tissue swelling. Electronically Signed   By: Odessa Fleming M.D.   On: 01/18/2023 07:45    Procedures Procedures    Medications Ordered in  ED Medications - No data to display  ED Course/ Medical Decision Making/ A&P                                 Medical Decision Making Amount and/or Complexity of Data Reviewed Radiology: ordered.  Risk Prescription drug management.   This patient is a 57 y.o. female  who presents to the ED for concern of right knee and leg pain.   Differential diagnoses prior to evaluation: The emergent differential diagnosis includes, but is not limited to, fracture, dislocation, ligamentous injury, septic joint, DVT. This is not an exhaustive differential.   Past Medical History / Co-morbidities / Social History: anxiety, hypertension, SVT s/p ICD, left bundle branch block, hyperlipidemia, asthma, coronary artery spasm, NSTEMI, CHF (LVEF 35 to 40%, echo October 2024)  Additional history: Chart reviewed. Pertinent results include: Patient was recently admitted to the hospital for 2 days, was discharged on 10/18 in setting of unstable angina.  Left heart cath showed no change in CAD from prior study.  Physical Exam: Physical exam performed. The pertinent findings include: Mildly hypertensive, otherwise normal vital signs.  No acute distress.  Right anterior knee  tenderness to palpation without other deformity.  Tenderness of the right calf which increases my suspicion for possible DVT in the setting of atraumatic knee/leg pain.  Lab Tests/Imaging studies: I personally interpreted labs/imaging and the pertinent results include: Right knee x-ray shows no osseous abnormality, possible small joint effusion. I agree with the radiologist interpretation.  Right lower extremity ultrasound shows no evidence of DVT.   Disposition: After consideration of the diagnostic results and the patients response to treatment, I feel that emergency department workup does not suggest an emergent condition requiring admission or immediate intervention beyond what has been performed at this time. The plan is: Discharge to home with treatment of right knee pain.  No emergent etiology found for symptoms today.  No evidence of DVT on ultrasound.  As patient is not post take NSAIDs with her history of chronic kidney disease, will send prescription for steroids instead.  Encouraged her to continue Tylenol as needed for pain.  Given orthopedic follow-up if symptoms do not improve.. The patient is safe for discharge and has been instructed to return immediately for worsening symptoms, change in symptoms or any other concerns.  Final Clinical Impression(s) / ED Diagnoses Final diagnoses:  Acute pain of right knee    Rx / DC Orders ED Discharge Orders          Ordered    methylPREDNISolone (MEDROL DOSEPAK) 4 MG TBPK tablet        01/18/23 1118           Portions of this report may have been transcribed using voice recognition software. Every effort was made to ensure accuracy; however, inadvertent computerized transcription errors may be present.    Su Monks, PA-C 01/18/23 1121    Horton, Clabe Seal, DO 01/18/23 1552

## 2023-01-18 NOTE — Discharge Instructions (Signed)
You were seen in the emergency department for knee pain.  As we discussed your ultrasound was negative for a blood clot.  I think that your symptoms are likely coming from some wear-and-tear on the joint.  I have sent you a course of some steroid medication, you can take this in addition to the Tylenol and gabapentin that you were taking.  I have also attached the contact information for the orthopedist that I recommend following up with if your symptoms do not improve.  You can use topical creams, ice as needed for swelling, and elevate the extremity as needed.  Continue to monitor how you're doing and return to the ER for new or worsening symptoms.

## 2023-01-19 ENCOUNTER — Ambulatory Visit: Payer: Medicaid Other | Attending: Cardiovascular Disease | Admitting: Cardiovascular Disease

## 2023-01-19 ENCOUNTER — Encounter: Payer: Self-pay | Admitting: Cardiovascular Disease

## 2023-01-19 VITALS — BP 145/96 | HR 60 | Ht 61.0 in | Wt 195.0 lb

## 2023-01-19 DIAGNOSIS — Z95810 Presence of automatic (implantable) cardiac defibrillator: Secondary | ICD-10-CM

## 2023-01-19 DIAGNOSIS — I428 Other cardiomyopathies: Secondary | ICD-10-CM

## 2023-01-19 DIAGNOSIS — I471 Supraventricular tachycardia, unspecified: Secondary | ICD-10-CM | POA: Diagnosis not present

## 2023-01-19 DIAGNOSIS — I10 Essential (primary) hypertension: Secondary | ICD-10-CM

## 2023-01-19 DIAGNOSIS — I447 Left bundle-branch block, unspecified: Secondary | ICD-10-CM | POA: Diagnosis not present

## 2023-01-19 DIAGNOSIS — E876 Hypokalemia: Secondary | ICD-10-CM

## 2023-01-19 DIAGNOSIS — Z79899 Other long term (current) drug therapy: Secondary | ICD-10-CM

## 2023-01-19 DIAGNOSIS — E7800 Pure hypercholesterolemia, unspecified: Secondary | ICD-10-CM

## 2023-01-19 DIAGNOSIS — I5042 Chronic combined systolic (congestive) and diastolic (congestive) heart failure: Secondary | ICD-10-CM

## 2023-01-19 MED ORDER — SACUBITRIL-VALSARTAN 97-103 MG PO TABS: 1.0000 | ORAL_TABLET | Freq: Two times a day (BID) | ORAL | 3 refills | Status: DC

## 2023-01-19 NOTE — Patient Instructions (Addendum)
Medication Instructions:  Increase Entresto to 97-103 two times a day  AFTER BEING ON ENTRESTO 97-103 FOR ONE WEEK= STOP LASIX  KEEP A  DAILY LOG OF YOUR WEIGHT. NOTIFY us IF YOUR WEIGHT INCREASES 3 LBS IN A DAY AND/OR 5 LBS IN A WEEK *If you need a refill on your cardiac medications before your next appointment, please call your pharmacy*   Lab Work: BMP and TSH TODAY  3 MONTHS= Lipid Panel- fasting- Please return for Blood Work in 3 months. No appointment needed, lab here at the office is open Monday-Friday from 8AM to 4PM and closed daily for lunch from 12:45-1:45.   If you have labs (blood work) drawn today and your tests are completely normal, you will receive your results only by: MyChart Message (if you have MyChart) OR A paper copy in the mail If you have any lab test that is abnormal or we need to change your treatment, we will call you to review the results.   Testing/Procedures: Your physician has requested that you have an echocardiogram in JANUARY 2025. Echocardiography is a painless test that uses sound waves to create images of your heart. It provides your doctor with information about the size and shape of your heart and how well your heart's chambers and valves are working. This procedure takes approximately one hour. There are no restrictions for this procedure. Please do NOT wear cologne, perfume, aftershave, or lotions (deodorant is allowed). Please arrive 15 minutes prior to your appointment time.    Follow-Up: At [facility], you and your health needs are our priority.  As part of our continuing mission to provide you with exceptional heart care, we have created designated Provider Care Teams.  These Care Teams include your primary Cardiologist (physician) and Advanced Practice Providers (APPs -  Physician Assistants and Nurse Practitioners) who all work together to provide you with the care you need, when you need it.  We recommend signing up for the  patient portal called "MyChart".  Sign up information is provided on this After Visit Summary.  MyChart is used to connect with patients for Virtual Visits (Telemedicine).  Patients are able to view lab/test results, encounter notes, upcoming appointments, etc.  Non-urgent messages can be sent to your provider as well.   To learn more about what you can do with MyChart, go to [email or web address].    Your next appointment:   3-4 month(s)- ON A DEVICE DAY- PHYS PACER CHECK  Provider:   Dr Royann Shivers

## 2023-01-19 NOTE — Progress Notes (Signed)
Cardiology Office Note:  .   Date:  01/20/2023  ID:  [name], DOB 02-12-1966, MRN [id number] PCP: Corrin Parker, FNP  [facility] Providers Cardiologist:  None    History of Present Illness: [name]   [name] is a 57 y.o. 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  Had a presentation with episode of non-STEMI in 2013 with chest pain, estimated to be due to coronary spasm, as witnessed during angiography.  She was hospitalized 01/08/2023 when she presented with chest pain.  She underwent repeat coronary angiography that did not show any worsening of her coronary stenoses, which remain moderate or mild.  She had normal cardiac index and normal filling pressures (RAP 3 mmHg, PAP 33/12, PAWP 10 mmHg, CI 2.7 L/min/m).  Echo showed LVEF 35-40% with a mild-moderately dilated LV and mild LVH, moderately dilated left atrial.  Mitral inflow showed an impaired laxation pattern (E/a 0.7, E/e' was borderline high at 14-16).  We did not interrogate her device today.  Has not had syncope or defibrillator shocks.  Appointment, interrogation of her device today shows that she has not had atrial or ventricular arrhythmias since device implantation.  She has 33% atrial pacing and 99% effective biventricular pacing.  All lead parameters were checked and they are within normal range.  The left ventricular lead is programmed with an  output of 1.75 V at 0.5 ms in the D1-M2 configuration, LV first by 60 ms.  Once again her ECG does not show clear evidence of effective resynchronization with a QRS duration of 166 ms and with only a very tiny initial R wave in lead V1.  At the time of her hospital discharge potassium was 3.3 and the creatinine was 1.12 (baseline 0.8-0.9).  Liver function tests were normal. Note that during her hospital admission her BNP was only very mildly elevated at 112-157, probably her baseline.  Despite reported compliance with atorvastatin her cholesterol was high 267 and LDL 161.  Triglycerides also elevated to 199.  Ezetimibe and carvedilol (instead of metoprolol) were started.  She is currently feeling well.  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  She only gets short of breath if she really rushes.  She has not had lower extremity edema, orthopnea or PND.  She has not had any episodes of chest pain.  Denies dizziness or syncope.  She states that if she is really really relax her blood pressure may be as low as 127/86.   Studies Reviewed: [name]   EKG Interpretation Date/Time:  Monday January 19 2023 15:15:02 EDT Ventricular Rate:  60 PR Interval:  166 QRS Duration:  162 QT Interval:  534 QTC Calculation: 534 R Axis:   53  Text Interpretation: Atrial-sensed ventricular-paced rhythm with frequent AV dual-paced complexes When compared with ECG of 07-Jan-2023 17:32, No significant change since last tracing Confirmed by Rama Sorci [id number]) on 01/19/2023 5:44:29 PMCardiac MRI 05/21/2022:  1.  LV  is severely dilated with severe systolic dysfunction, with akinesis of the basal to mid inferolateral wall. LVEF 26 %  2.  RV normal size and systolic function.  3.  Left atrium is dilated.  4.  No significant valvular abnormalities.  5.  LGE: There is basal anterior midwall stripe and basal inferoseptal midwall stripe which can be seen in  prior myocarditis. There is base to mid inferolateral and lateral subendocardial LGE 25-50% likely from prior infarct in left circumflex territory. No evidence of active edema or inflammation.   Echocardiogram 01/08/2023:  1. Left ventricular ejection fraction, by estimation, is 35 to 40%. The  left ventricle has mild to moderately decreased function. The left  ventricle demonstrates regional wall motion abnormalities (see scoring  diagram/findings for description). The left   ventricular internal cavity size was mildly to moderately dilated. There  is mild concentric left ventricular hypertrophy. Left ventricular  diastolic parameters are consistent with Grade II diastolic dysfunction  (pseudonormalization).   2. Right ventricular systolic function is low normal. The right  ventricular size is normal.   3. Left atrial size was moderately dilated.   4. The mitral valve is normal in structure. No evidence of mitral valve  regurgitation.   5. The aortic valve is normal in structure. Aortic valve regurgitation is  not visualized.   Cardiac catheterization 01/08/2023:    Prox RCA lesion is 40% stenosed.   Mid Cx lesion is 40% stenosed.   1.  Right radial arterial loop requiring balloon assisted tracking to negotiate. 2.  Mild coronary artery disease unchanged from previous study (at [facility]). 3.  Fick cardiac output of 4.8 L/min and Fick cardiac index of 2.7 liters per minute per meter squared with the following hemodynamics:             Right atrial pressure mean of 3 mmHg             Right ventricular pressure 31/0 with an end-diastolic pressure of 8 mmHg             Wedge pressure mean of 10 mmHg             Pulmonary artery pressure of 33/12 with a mean of 18 mmHg             PVR of 2.9 Woods units             PA pulsatility index of 6.3   Recommendation: Medical therapy  NM myocardial perfusion stress test 05/31/2021: CONCLUSION: 1. No evidence of inducible ischemia. Favored  attenuation artifact of the inferior wall. 2. Reduced left ventricular ejection fraction of 46%. 3. Mild global hypokinesia.  CRT-D implant 05/22/2022  Generator: Model Gallant HF  SN [id number]  RA lead: Model [id number]  SN [id number]  RV lead: Model 7122Q  SN [id number]  LV lead: Model 1456Q  SN [id number]   RA: Threshold 0.9 V @ 1.0 ms; Impedance 447 ohms; P wave 4.8  mV  RV: Threshold 0.5 V @ 0.5 ms; Impedance 622 ohms; R wave 19.2 mV  LV: Threshold 1.7 V @ 0.5 ms; Impedance 1202 ohms; R wave 10.4 mV  (D1-M2, LV first 60 ms  Mode: DDD, 60-130  VT Monitor 150 bpm  VT-2 171 bpm, ATP x 4, 36, 40 J x 2  VF 214 bpm, ATP x 1, 36, 40 J x 5    Risk Assessment/Calculations:     HYPERTENSION CONTROL Vitals:   01/19/23 1505 01/19/23 1524  BP: (!) 156/98 [name])  145/96    The patient's blood pressure is elevated above target today.  In order to address the patient's elevated BP:           Physical Exam:   VS:  BP (!) 145/96   Pulse 60   Ht 5\' 1"  (1.549 m)   Wt 195 lb (88.5 kg)   SpO2 97%   BMI 36.84 kg/m    Wt Readings from Last 3 Encounters:  01/19/23 195 lb (88.5 kg)  01/18/23 182 lb (82.6 kg)  01/07/23 182 lb (82.6 kg)    GEN: Well nourished, well developed in no acute distress. Obese. NECK: No JVD; No carotid bruits CARDIAC: Healthy left subclavian defibrillator site, RRR, no murmurs, rubs, gallops RESPIRATORY:  Clear to auscultation without rales, wheezing or rhonchi  ABDOMEN: Soft, non-tender, non-distended EXTREMITIES:  No edema; No deformity   ASSESSMENT AND PLAN: .   CHF: NYHA functional class II, clinically euvolemic.  BNP was low and filling pressures were normal at the time of a cardiac catheterization 01/08/2023 increase Entresto to 97-103 mg twice daily dose.  History of acute renal insufficiency when spironolactone was tried twice in the past.  On maximum dose carvedilol and on SGLT2 inhibitor.  Reassess LVEF in about 3 months.  Stop furosemide when she is on the  maximum dose of Entresto, but needs to weigh daily to monitor for fluid gain.  Recheck potassium level and creatinine today. VT: She had cardiac arrest due to monomorphic VT, likely related to areas of scar that we have seen on her MRI.  On amiodarone she has not had any ventricular arrhythmia over the last 4 months.   Amiodarone: Discussed the potential side effects and the need to monitor for drug interactions, respiratory abnormalities, liver and thyroid function test abnormalities, etc. Liver function tests checked last week, need to check thyroid function studies. CMP: Likely a complex, multifactorial problem.  For years she was thought to have hypertensive cardiomyopathy.  She clearly had an NSTEMI despite normal coronary arteries in 2013 and there is an inferolateral subendocardial scar that may be the result of that event.  In addition she has mid wall scar that looks more like the consequence of myocarditis.  She does not have other clinical features to suggest sarcoidosis (no skin rashes, no lymphadenopathy, etc.).  Need to keep this possibility in mind and may need an FDG PET scan in the future. CRT-D: "99%" BiV pacing, but equivocal evidence that CRT is making a difference. QRS is narrower, but there is no R in V1 and the ECG may be showing pseudofusion.  Reassess at next office visit. CAD: Recently had chest pain, but repeat cardiac catheterization again showed only minor nonobstructive disease.  She does have multiple coronary risk factors.  She is on a statin, but target LDL has not been achieved.  Needs good glycemic control.  The pressure is still too high.  She does not smoke.  It is quite possible that coronary vasospasm was the cause for her infarction in 2013, since vasospasm was seen during angiography.  Carvedilol is a better choice than a selective beta-blocker.  She is also on long-acting nitrates. HTN: Increase Entresto to maximum dose.  May need to add amlodipine if her blood  pressure still too high after that. CKD3a: Creatinine has actually improved after we added Entresto.  Per her report and looking through the labs there were 2 unsuccessful attempts to start spironolactone. DM: Fair glycemic control with a hemoglobin A1c of 6.3%  06/05/2022.  On dapagliflozin 10 mg daily for heart failure benefit. HLP: Even though her cardiomyopathy is probably primarily nonischemic, important to keep LDL <70. Now on max dose atorvastatin + ezetimibe. Recheck labs in a couple of months  At next appt will see if we can optimize CRT timing.       Dispo:  Patient Instructions  Medication Instructions:  Increase Entresto to 97-103 two times a day  AFTER BEING ON ENTRESTO 97-103 FOR ONE WEEK= STOP LASIX  KEEP A  DAILY LOG OF YOUR WEIGHT. NOTIFY us IF YOUR WEIGHT INCREASES 3 LBS IN A DAY AND/OR 5 LBS IN A WEEK *If you need a refill on your cardiac medications before your next appointment, please call your pharmacy*   Lab Work: BMP and TSH TODAY  3 MONTHS= Lipid Panel- fasting- Please return for Blood Work in 3 months. No appointment needed, lab here at the office is open Monday-Friday from 8AM to 4PM and closed daily for lunch from 12:45-1:45.   If you have labs (blood work) drawn today and your tests are completely normal, you will receive your results only by: MyChart Message (if you have MyChart) OR A paper copy in the mail If you have any lab test that is abnormal or we need to change your treatment, we will call you to review the results.   Testing/Procedures: Your physician has requested that you have an echocardiogram in JANUARY 2025. Echocardiography is a painless test that uses sound waves to create images of your heart. It provides your doctor with information about the size and shape of your heart and how well your heart's chambers and valves are working. This procedure takes approximately one hour. There are no restrictions for this procedure. Please do NOT wear  cologne, perfume, aftershave, or lotions (deodorant is allowed). Please arrive 15 minutes prior to your appointment time.    Follow-Up: At [facility], you and your health needs are our priority.  As part of our continuing mission to provide you with exceptional heart care, we have created designated Provider Care Teams.  These Care Teams include your primary Cardiologist (physician) and Advanced Practice Providers (APPs -  Physician Assistants and Nurse Practitioners) who all work together to provide you with the care you need, when you need it.  We recommend signing up for the patient portal called "MyChart".  Sign up information is provided on this After Visit Summary.  MyChart is used to connect with patients for Virtual Visits (Telemedicine).  Patients are able to view lab/test results, encounter notes, upcoming appointments, etc.  Non-urgent messages can be sent to your provider as well.   To learn more about what you can do with MyChart, go to [email or web address].    Your next appointment:   3-4 month(s)- ON A DEVICE DAY- PHYS PACER CHECK  Provider:   Dr Royann Shivers    Signed, Thurmon Fair, MD

## 2023-01-20 LAB — BASIC METABOLIC PANEL
BUN/Creatinine Ratio: 19 (ref 9–23)
BUN: 25 mg/dL — ABNORMAL HIGH (ref 6–24)
CO2: 20 mmol/L (ref 20–29)
Calcium: 9.4 mg/dL (ref 8.7–10.2)
Chloride: 107 mmol/L — ABNORMAL HIGH (ref 96–106)
Creatinine, Ser: 1.29 mg/dL — ABNORMAL HIGH (ref 0.57–1.00)
Glucose: 119 mg/dL — ABNORMAL HIGH (ref 70–99)
Potassium: 4 mmol/L (ref 3.5–5.2)
Sodium: 143 mmol/L (ref 134–144)
eGFR: 48 mL/min/{1.73_m2} — ABNORMAL LOW (ref >59)

## 2023-01-20 LAB — TSH: TSH: 1.34 u[IU]/mL (ref 0.450–4.500)

## 2023-01-21 ENCOUNTER — Telehealth: Payer: Self-pay | Admitting: Emergency Medicine

## 2023-01-21 ENCOUNTER — Other Ambulatory Visit: Payer: Self-pay | Admitting: Emergency Medicine

## 2023-01-21 DIAGNOSIS — E7800 Pure hypercholesterolemia, unspecified: Secondary | ICD-10-CM

## 2023-01-21 DIAGNOSIS — R7989 Other specified abnormal findings of blood chemistry: Secondary | ICD-10-CM

## 2023-01-21 DIAGNOSIS — Z79899 Other long term (current) drug therapy: Secondary | ICD-10-CM

## 2023-01-21 NOTE — Telephone Encounter (Signed)
Croitoru, Mihai, MD  Scheryl Marten, RN Labs are mostly OK, kidney function is slightly abnormal, but has been in a similar range and even higher as far back as 2016. Normal thyroid test. After increasing the Entresto to the 97-103 dose for one week, please stop the furosemide and monitor weights daily. Please call for weight gain of 3 lb or more in 24 hours or 5 lb or more in a week. Please repeat the BMET when we recheck the lipids in a few months (Katie, please order that).  Called pt- Went over these directions with the patient- they are also on her AVS from her last OV.  Mailing lab slip for lipid and BMP to be drawn in 3 months.  She verbalized understanding of all information/instructions.

## 2023-02-16 ENCOUNTER — Other Ambulatory Visit (HOSPITAL_COMMUNITY): Payer: No Typology Code available for payment source

## 2023-03-13 ENCOUNTER — Ambulatory Visit (INDEPENDENT_AMBULATORY_CARE_PROVIDER_SITE_OTHER): Payer: No Typology Code available for payment source

## 2023-03-13 DIAGNOSIS — I428 Other cardiomyopathies: Secondary | ICD-10-CM

## 2023-03-13 LAB — CUP PACEART REMOTE DEVICE CHECK
Battery Remaining Longevity: 80 mo
Battery Remaining Percentage: 87 %
Battery Voltage: 2.99 V
Brady Statistic AP VP Percent: 36 %
Brady Statistic AP VS Percent: 1 %
Brady Statistic AS VP Percent: 63 %
Brady Statistic AS VS Percent: 1 %
Brady Statistic RA Percent Paced: 35 %
Date Time Interrogation Session: 20241219210300
HighPow Impedance: 65 Ohm
Implantable Lead Connection Status: 753985
Implantable Lead Connection Status: 753985
Implantable Lead Implant Date: 20240229
Implantable Lead Implant Date: 20240229
Implantable Lead Location: 753858
Implantable Lead Location: 753860
Implantable Pulse Generator Implant Date: 20240229
Lead Channel Impedance Value: 1025 Ohm
Lead Channel Impedance Value: 400 Ohm
Lead Channel Impedance Value: 500 Ohm
Lead Channel Pacing Threshold Amplitude: 0.75 V
Lead Channel Pacing Threshold Amplitude: 1 V
Lead Channel Pacing Threshold Amplitude: 1.25 V
Lead Channel Pacing Threshold Pulse Width: 0.5 ms
Lead Channel Pacing Threshold Pulse Width: 0.5 ms
Lead Channel Pacing Threshold Pulse Width: 0.5 ms
Lead Channel Sensing Intrinsic Amplitude: 12 mV
Lead Channel Sensing Intrinsic Amplitude: 5 mV
Lead Channel Setting Pacing Amplitude: 2 V
Lead Channel Setting Pacing Amplitude: 2.25 V
Lead Channel Setting Pacing Amplitude: 2.5 V
Lead Channel Setting Pacing Pulse Width: 0.5 ms
Lead Channel Setting Pacing Pulse Width: 0.5 ms
Lead Channel Setting Sensing Sensitivity: 0.5 mV
Pulse Gen Serial Number: 211015531
Zone Setting Status: 755011

## 2023-03-26 ENCOUNTER — Other Ambulatory Visit (HOSPITAL_COMMUNITY): Payer: Self-pay | Admitting: Sports Medicine

## 2023-03-26 DIAGNOSIS — M25551 Pain in right hip: Secondary | ICD-10-CM

## 2023-03-26 DIAGNOSIS — M25561 Pain in right knee: Secondary | ICD-10-CM

## 2023-03-30 ENCOUNTER — Ambulatory Visit (HOSPITAL_COMMUNITY): Payer: Medicaid Other | Attending: Cardiovascular Disease

## 2023-03-30 DIAGNOSIS — I5042 Chronic combined systolic (congestive) and diastolic (congestive) heart failure: Secondary | ICD-10-CM | POA: Insufficient documentation

## 2023-03-30 LAB — ECHOCARDIOGRAM COMPLETE
Area-P 1/2: 3.54 cm2
S' Lateral: 4.8 cm

## 2023-04-01 ENCOUNTER — Other Ambulatory Visit: Payer: Self-pay

## 2023-04-01 ENCOUNTER — Encounter (HOSPITAL_COMMUNITY): Payer: Self-pay

## 2023-04-01 ENCOUNTER — Emergency Department (HOSPITAL_COMMUNITY): Payer: Medicaid Other

## 2023-04-01 ENCOUNTER — Emergency Department (HOSPITAL_COMMUNITY)
Admission: EM | Admit: 2023-04-01 | Discharge: 2023-04-01 | Disposition: A | Discharge status: Home / Self Care | Payer: Medicaid Other | Attending: Emergency Medicine | Admitting: Emergency Medicine

## 2023-04-01 DIAGNOSIS — N189 Chronic kidney disease, unspecified: Secondary | ICD-10-CM | POA: Insufficient documentation

## 2023-04-01 DIAGNOSIS — Z7951 Long term (current) use of inhaled steroids: Secondary | ICD-10-CM | POA: Insufficient documentation

## 2023-04-01 DIAGNOSIS — R202 Paresthesia of skin: Secondary | ICD-10-CM | POA: Insufficient documentation

## 2023-04-01 DIAGNOSIS — Z79899 Other long term (current) drug therapy: Secondary | ICD-10-CM | POA: Insufficient documentation

## 2023-04-01 DIAGNOSIS — I130 Hypertensive heart and chronic kidney disease with heart failure and stage 1 through stage 4 chronic kidney disease, or unspecified chronic kidney disease: Secondary | ICD-10-CM | POA: Diagnosis not present

## 2023-04-01 DIAGNOSIS — J45909 Unspecified asthma, uncomplicated: Secondary | ICD-10-CM | POA: Insufficient documentation

## 2023-04-01 DIAGNOSIS — R079 Chest pain, unspecified: Secondary | ICD-10-CM | POA: Insufficient documentation

## 2023-04-01 DIAGNOSIS — I509 Heart failure, unspecified: Secondary | ICD-10-CM | POA: Insufficient documentation

## 2023-04-01 DIAGNOSIS — Z7982 Long term (current) use of aspirin: Secondary | ICD-10-CM | POA: Diagnosis not present

## 2023-04-01 DIAGNOSIS — Z20822 Contact with and (suspected) exposure to covid-19: Secondary | ICD-10-CM | POA: Diagnosis not present

## 2023-04-01 LAB — CBC
HCT: 43.9 % (ref 36.0–46.0)
Hemoglobin: 13.4 g/dL (ref 12.0–15.0)
MCH: 28.1 pg (ref 26.0–34.0)
MCHC: 30.5 g/dL (ref 30.0–36.0)
MCV: 92 fL (ref 80.0–100.0)
Platelets: 204 10*3/uL (ref 150–400)
RBC: 4.77 MIL/uL (ref 3.87–5.11)
RDW: 15.1 % (ref 11.5–15.5)
WBC: 7.4 10*3/uL (ref 4.0–10.5)
nRBC: 0 % (ref 0.0–0.2)

## 2023-04-01 LAB — BASIC METABOLIC PANEL
Anion gap: 7 (ref 5–15)
BUN: 24 mg/dL — ABNORMAL HIGH (ref 6–20)
CO2: 22 mmol/L (ref 22–32)
Calcium: 9 mg/dL (ref 8.9–10.3)
Chloride: 108 mmol/L (ref 98–111)
Creatinine, Ser: 0.73 mg/dL (ref 0.44–1.00)
GFR, Estimated: >60 mL/min (ref >60)
Glucose, Bld: 108 mg/dL — ABNORMAL HIGH (ref 70–99)
Potassium: 3.5 mmol/L (ref 3.5–5.1)
Sodium: 137 mmol/L (ref 135–145)

## 2023-04-01 LAB — TROPONIN I (HIGH SENSITIVITY)
Troponin I (High Sensitivity): 24 ng/L — ABNORMAL HIGH (ref <18)
Troponin I (High Sensitivity): 22 ng/L — ABNORMAL HIGH (ref <18)

## 2023-04-01 LAB — HCG, SERUM, QUALITATIVE: Preg, Serum: NEGATIVE

## 2023-04-01 LAB — RESP PANEL BY RT-PCR (RSV, FLU A&B, COVID)  RVPGX2
Influenza A by PCR: NEGATIVE
Influenza B by PCR: NEGATIVE
Resp Syncytial Virus by PCR: NEGATIVE
SARS Coronavirus 2 by RT PCR: NEGATIVE

## 2023-04-01 LAB — BRAIN NATRIURETIC PEPTIDE: B Natriuretic Peptide: 85.2 pg/mL (ref 0.0–100.0)

## 2023-04-01 MED ORDER — ALBUTEROL SULFATE HFA 108 (90 BASE) MCG/ACT IN AERS: 2.0000 | INHALATION_SPRAY | RESPIRATORY_TRACT | Status: DC | PRN

## 2023-04-01 MED ORDER — NITROGLYCERIN 2 % TD OINT
1.0000 [in_us] | TOPICAL_OINTMENT | Freq: Once | TRANSDERMAL | Status: AC
  Administered 2023-04-01: 1 [in_us] via TOPICAL
  Filled 2023-04-01: qty 1

## 2023-04-01 MED ORDER — MORPHINE SULFATE (PF) 4 MG/ML IV SOLN
4.0000 mg | Freq: Once | INTRAVENOUS | Status: AC
  Administered 2023-04-01: 4 mg via INTRAVENOUS
  Filled 2023-04-01: qty 1

## 2023-04-01 NOTE — ED Provider Triage Note (Signed)
 Emergency Medicine Provider Triage Evaluation Note  [name] , a 58 y.o. female  was evaluated in triage.  Pt complains of central chest pain x 1 hour, pressure/heavy.  Review of Systems  Positive: Chest pain Negative: emesis  Physical Exam  BP (!) 186/121   Pulse 71   Temp 98 F (36.7 C) (Oral)   Resp 18   Ht 5' 1 (1.549 m)   Wt 88.5 kg   SpO2 100%   BMI 36.87 kg/m  Gen:   Awake, no distress   Resp:  Normal effort  MSK:   Moves extremities without difficulty  Other:    Medical Decision Making  Medically screening exam initiated at 11:32 AM.  Appropriate orders placed.  [name] was informed that the remainder of the evaluation will be completed by another provider, this initial triage assessment does not replace that evaluation, and the importance of remaining in the ED until their evaluation is complete.  Will initiate CP workup. Recent Cath reassuring.    Charlyn Sora, MD 04/01/23 1133

## 2023-04-01 NOTE — ED Triage Notes (Signed)
 Chest pain that started an hour PTA while lying down. Pain radiates down right arm. C/o sob and some nausea. Pt has pacemaker.

## 2023-04-01 NOTE — ED Provider Notes (Signed)
 [place] EMERGENCY DEPARTMENT AT [facility] Provider Note   CSN: [id number] Arrival date & time: 04/01/23  1111     History  Chief Complaint  Patient presents with   Chest Pain    [name] is a 58 y.o. female.  Pt is a 57y/o female with hx of chronic CHF with EF of 40-45% as of this week, coronary artery spasms, asthma, hypertension, hyperlipidemia, CKD, nonischemic cardiomyopathy, 40% mid RCA and 40% mid circumflex lesions on recent cath in October which is unchanged, type 2 diabetes, hyperlipidemia who is presenting today with chest pain.  Patient reports that for the last 1 week she has had nasal congestion, cough and cold-like symptoms.  She has had intermittent wheezing and has been using her inhaler as needed.  However then this morning when she was still laying in bed she started having a very intense pain in the center of her chest.  Initially it made her feel short of breath but she feels like it is a little better now.  She denies any pleuritic type pain but certain movements does make it hurt.  Initially she was a little bit nauseated this was around 930 10:00 this morning but reports that has resolved.  She reports now she is hungry.  He denies any radiation of pain into her back or shoulders but did note earlier she had a little bit of tingling in her right arm.  She denies any pain with eating yesterday or symptoms with walking.  She cannot think of anything that makes the pain start and is just been waxing and waning.  Prior to my evaluation patient had received morphine  and Nitropaste was placed which she reports the pain is still there.  The history is provided by the patient, the spouse and medical records.  Chest Pain      Home Medications Prior to Admission medications   Medication Sig Start Date End Date Taking? Authorizing Provider  albuterol  (PROVENTIL  HFA;VENTOLIN  HFA) 108 (90 BASE) MCG/ACT inhaler Inhale 2 puffs into the lungs every 6 (six)  hours as needed for wheezing or shortness of breath. For wheezing Patient not taking: Reported on 01/19/2023    [provider]  amiodarone  (PACERONE ) 200 MG tablet Take 1 tablet (200 mg total) by mouth daily. Patient taking differently: Take 200 mg by mouth 2 (two) times daily. 09/10/22   Croitoru, Mihai, MD  aspirin  EC 81 MG tablet Take 81 mg by mouth daily. 04/22/22   [provider]  atorvastatin  (LIPITOR) 80 MG tablet Take 80 mg by mouth at bedtime.    [provider]  carvedilol  (COREG ) 25 MG tablet Take 1 tablet (25 mg total) by mouth 2 (two) times daily with a meal. 01/09/23   Sheikh, Alejandro Latif, DO  dapagliflozin  propanediol (FARXIGA ) 10 MG TABS tablet Take 1 tablet (10 mg total) by mouth daily before breakfast. 01/09/23   Sheikh, Omair Latif, DO  ezetimibe  (ZETIA ) 10 MG tablet Take 1 tablet (10 mg total) by mouth daily. 01/10/23   Sherrill Alejandro Latif, DO  ferrous sulfate  325 (65 FE) MG tablet Take 1 tablet (325 mg total) by mouth every morning. 01/10/23   Sherrill Alejandro Latif, DO  furosemide  (LASIX ) 20 MG tablet Take 20 mg by mouth daily.    [provider]  gabapentin  (NEURONTIN ) 600 MG tablet Take 600 mg by mouth 2 (two) times daily.    [provider]  isosorbide  mononitrate (IMDUR ) 60 MG 24 hr tablet Take 1  tablet (60 mg total) by mouth daily. 01/09/23   Sherrill Cable Latif, DO  methylPREDNISolone  (MEDROL  DOSEPAK) 4 MG TBPK tablet Take per package instructions 01/18/23   Roemhildt, Lorin T, PA-C  nitroGLYCERIN  (NITROSTAT ) 0.4 MG SL tablet Place 0.4 mg under the tongue every 5 (five) minutes as needed for chest pain.    [provider]  sacubitril -valsartan  (ENTRESTO ) 97-103 MG Take 1 tablet by mouth 2 (two) times daily. 01/19/23   Croitoru, Mihai, MD  solifenacin (VESICARE) 5 MG tablet Take 1 tablet by mouth daily. 12/23/22   [provider]      Allergies    Amoxicillin, Penicillins, and Shellfish allergy    Review of  Systems   Review of Systems  Cardiovascular:  Positive for chest pain.    Physical Exam Updated Vital Signs BP 131/87   Pulse 67   Temp 98 F (36.7 C) (Oral)   Resp 14   Ht 5' 1 (1.549 m)   Wt 88.5 kg   SpO2 100%   BMI 36.87 kg/m  Physical Exam Vitals and nursing note reviewed.  Constitutional:      General: She is not in acute distress.    Appearance: She is well-developed.  HENT:     Head: Normocephalic and atraumatic.  Eyes:     Pupils: Pupils are equal, round, and reactive to light.  Cardiovascular:     Rate and Rhythm: Normal rate and regular rhythm.     Pulses: Normal pulses.     Heart sounds: Normal heart sounds. No murmur heard.    No friction rub.  Pulmonary:     Effort: Pulmonary effort is normal.     Breath sounds: Normal breath sounds. No wheezing or rales.  Abdominal:     General: Bowel sounds are normal. There is no distension.     Palpations: Abdomen is soft.     Tenderness: There is no abdominal tenderness. There is no guarding or rebound.  Musculoskeletal:        General: No tenderness. Normal range of motion.     Right lower leg: No edema.     Left lower leg: No edema.     Comments: No edema  Skin:    General: Skin is warm and dry.     Findings: No rash.  Neurological:     Mental Status: She is alert and oriented to person, place, and time. Mental status is at baseline.     Cranial Nerves: No cranial nerve deficit.  Psychiatric:        Mood and Affect: Mood normal.        Behavior: Behavior normal.     ED Results / Procedures / Treatments   Labs (all labs ordered are listed, but only abnormal results are displayed) Labs Reviewed  BASIC METABOLIC PANEL - Abnormal; Notable for the following components:      Result Value   Glucose, Bld 108 (*)    BUN 24 (*)    All other components within normal limits  TROPONIN I (HIGH SENSITIVITY) - Abnormal; Notable for the following components:   Troponin I (High Sensitivity) 24 (*)    All other  components within normal limits  TROPONIN I (HIGH SENSITIVITY) - Abnormal; Notable for the following components:   Troponin I (High Sensitivity) 22 (*)    All other components within normal limits  RESP PANEL BY RT-PCR (RSV, FLU A&B, COVID)  RVPGX2  CBC  HCG, SERUM, QUALITATIVE  BRAIN NATRIURETIC PEPTIDE    EKG  EKG Interpretation Date/Time:  Wednesday April 01 2023 11:25:19 EST Ventricular Rate:  68 PR Interval:  170 QRS Duration:  143 QT Interval:  486 QTC Calculation: 517 R Axis:   -4  Text Interpretation: Sinus rhythm Left bundle branch block No significant change since last tracing Confirmed by Doretha Folks ([id number]) on 04/01/2023 3:08:45 PM  Radiology DG Chest 2 View Result Date: 04/01/2023 CLINICAL DATA:  Chest pain EXAM: CHEST - 2 VIEW COMPARISON:  None Available. FINDINGS: LEFT-sided pacer overlies normal cardiac silhouette. No effusion, infiltrate or pneumothorax. No pneumothorax. No acute osseous abnormality. IMPRESSION: No acute cardiopulmonary process. Electronically Signed   By: Jackquline Boxer M.D.   On: 04/01/2023 12:50    Procedures Procedures    Medications Ordered in ED Medications  albuterol  (VENTOLIN  HFA) 108 (90 Base) MCG/ACT inhaler 2 puff (has no administration in time range)  morphine  (PF) 4 MG/ML injection 4 mg (4 mg Intravenous Given 04/01/23 1142)  nitroGLYCERIN  (NITROGLYN) 2 % ointment 1 inch (1 inch Topical Given 04/01/23 1135)    ED Course/ Medical Decision Making/ A&P                                 Medical Decision Making Amount and/or Complexity of Data Reviewed Independent Historian: spouse External Data Reviewed: notes. Labs: ordered. Decision-making details documented in ED Course. Radiology: ordered and independent interpretation performed. Decision-making details documented in ED Course. ECG/medicine tests: ordered and independent interpretation performed. Decision-making details documented in ED Course.  Risk Prescription drug  management.   Pt with multiple medical problems and comorbidities and presenting today with a complaint that caries a high risk for morbidity and mortality.  Here today with nonspecific chest pain.  It started this morning and has been waxing and waning.  Some pain with movement but at other times she does not have an exacerbating cause.  Vital signs are reassuring.  She did receive morphine  and Nitropaste which she reported did improve the pain but it is not gone.  She has had URI symptoms and concern for pneumonia, bronchitis, asthma exacerbation.  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  Concern for ACS as well given her prior history or CHF exacerbation.  She does not have significant swelling on exam but does not check her weight regularly at home.  She is compliant with her medications and has had no recent changes.  Low suspicion for clot and no prior history of DVT.  She is on aspirin  but no other anticoagulants.  No distal lower extremity edema at this time.  Low suspicion for dissection or acute abdominal pathology.  I independently interpreted patient's labs and EKG.  EKG shows a sinus rhythm with occasional pacemaker spikes which is unchanged from her prior.  BMP within normal limits, CBC within normal limits, initial troponin is 24 which appears similar to her prior.  Delta troponin, BNP and viral swab pending.  I have independently visualized and interpreted pt's images today.  Chest x-ray within normal limits. Repeat troponin was flat at 22 and BNP is normal.  Viral swab is negative.  On repeat evaluation patient remained stable.  At this time feel that she can be discharged home but she was given return precautions.  No indication for admission at this time.          Final Clinical Impression(s) / ED Diagnoses Final diagnoses:  Nonspecific chest pain  Rx / DC Orders ED Discharge Orders     None         Doretha Folks, MD 04/01/23  1910

## 2023-04-01 NOTE — Discharge Instructions (Signed)
 All your blood work today looks really good and your x-ray does not show any signs of pneumonia however the pain could be coming from the cold you have had recently.  Use your inhaler at home to see if that helps when you have pain and shortness of breath.  I would expect your symptoms should be improving within the next few days.  However if you start having severe shortness of breath, vomiting, passing out or high fevers you should return to the emergency room.  Otherwise follow-up with your doctor for recheck within the next 1 to 2 weeks.

## 2023-04-08 ENCOUNTER — Emergency Department (HOSPITAL_COMMUNITY)
Admission: EM | Admit: 2023-04-08 | Discharge: 2023-04-08 | Disposition: A | Discharge status: Home / Self Care | Payer: Medicaid Other | Attending: Emergency Medicine | Admitting: Emergency Medicine

## 2023-04-08 ENCOUNTER — Other Ambulatory Visit: Payer: Self-pay

## 2023-04-08 DIAGNOSIS — Z7982 Long term (current) use of aspirin: Secondary | ICD-10-CM | POA: Insufficient documentation

## 2023-04-08 DIAGNOSIS — M5432 Sciatica, left side: Secondary | ICD-10-CM

## 2023-04-08 DIAGNOSIS — M25551 Pain in right hip: Secondary | ICD-10-CM | POA: Diagnosis not present

## 2023-04-08 DIAGNOSIS — M79661 Pain in right lower leg: Secondary | ICD-10-CM | POA: Diagnosis present

## 2023-04-08 MED ORDER — CELECOXIB 200 MG PO CAPS: 200.0000 mg | ORAL_CAPSULE | Freq: Two times a day (BID) | ORAL | 0 refills | Status: DC

## 2023-04-08 MED ORDER — OXYCODONE HCL 5 MG PO TABS: 2.5000 mg | ORAL_TABLET | ORAL | 0 refills | Status: DC | PRN

## 2023-04-08 MED ORDER — CYCLOBENZAPRINE HCL 10 MG PO TABS: 5.0000 mg | ORAL_TABLET | Freq: Two times a day (BID) | ORAL | 0 refills | Status: AC | PRN

## 2023-04-08 MED ORDER — METHYLPREDNISOLONE 4 MG PO TBPK: ORAL_TABLET | ORAL | 0 refills | Status: DC

## 2023-04-08 NOTE — ED Provider Notes (Addendum)
[place] EMERGENCY DEPARTMENT AT [facility] Provider Note   CSN: [id number] Arrival date & time: 04/08/23  1541     History  Chief Complaint  Patient presents with   Leg Pain    Right   Hip Pain    Right     [name] is a 58 y.o. female.  Who presents emergency department chief complaint of sciatica.  Patient has severe pain in her right lower extremity.  She been taking gabapentin.  She follows at [facility] and is scheduled for an outpatient MRI coming up however patient states her pain became worse that she been unable to sleep and cannot tolerate it.  Taking any other medications at this time.  She denies saddle anesthesia, bowel or bladder incontinence, weakness.  She feels better with standing worse with sitting.  Well I am discharging her now the worried about it she has not been AMA because I am getting ready to discharge her with medications that do not no worries I just need her vitals documented   Leg Pain Hip Pain       Home Medications Prior to Admission medications   Medication Sig Start Date End Date Taking? Authorizing Provider  albuterol (PROVENTIL HFA;VENTOLIN HFA) 108 (90 BASE) MCG/ACT inhaler Inhale 2 puffs into the lungs every 6 (six) hours as needed for wheezing or shortness of breath. For wheezing Patient not taking: Reported on 01/19/2023    [provider]  amiodarone (PACERONE) 200 MG tablet Take 1 tablet (200 mg total) by mouth daily. Patient taking differently: Take 200 mg by mouth 2 (two) times daily. 09/10/22   Croitoru, Mihai, MD  aspirin EC 81 MG tablet Take 81 mg by mouth daily. 04/22/22   [provider]  atorvastatin (LIPITOR) 80 MG tablet Take 80 mg by mouth at bedtime.    [provider]  carvedilol (COREG) 25 MG tablet Take 1 tablet (25 mg total) by mouth 2 (two) times daily with a meal. 01/09/23   Sheikh, Kateri Mc Latif, DO  dapagliflozin propanediol (FARXIGA) 10 MG TABS tablet Take 1 tablet  (10 mg total) by mouth daily before breakfast. 01/09/23   Marguerita Merles Latif, DO  ezetimibe (ZETIA) 10 MG tablet Take 1 tablet (10 mg total) by mouth daily. 01/10/23   Marguerita Merles Latif, DO  ferrous sulfate 325 (65 FE) MG tablet Take 1 tablet (325 mg total) by mouth every morning. 01/10/23   Marguerita Merles Latif, DO  furosemide (LASIX) 20 MG tablet Take 20 mg by mouth daily.    [provider]  gabapentin (NEURONTIN) 600 MG tablet Take 600 mg by mouth 2 (two) times daily.    [provider]  isosorbide mononitrate (IMDUR) 60 MG 24 hr tablet Take 1 tablet (60 mg total) by mouth daily. 01/09/23   Marguerita Merles Latif, DO  methylPREDNISolone (MEDROL DOSEPAK) 4 MG TBPK tablet Take per package instructions 01/18/23   Roemhildt, Lorin T, PA-C  nitroGLYCERIN (NITROSTAT) 0.4 MG SL tablet Place 0.4 mg under the tongue every 5 (five) minutes as needed for chest pain.    [provider]  sacubitril-valsartan (ENTRESTO) 97-103 MG Take 1 tablet by mouth 2 (two) times daily. 01/19/23   Croitoru, Mihai, MD  solifenacin (VESICARE) 5 MG tablet Take 1 tablet by mouth daily. 12/23/22   [provider]      Allergies    Amoxicillin, Penicillins, and Shellfish allergy    Review of Systems   Review of Systems  Physical Exam  Updated Vital Signs Ht 5\' 1"  (1.549 m)   Wt 88 kg   BMI 36.66 kg/m  Physical Exam Vitals and nursing note reviewed.  Constitutional:      General: She is not in acute distress.    Appearance: She is well-developed. She is not diaphoretic.  HENT:     Head: Normocephalic and atraumatic.     Right Ear: External ear normal.     Left Ear: External ear normal.     Nose: Nose normal.     Mouth/Throat:     Mouth: Mucous membranes are moist.  Eyes:     General: No scleral icterus.    Conjunctiva/sclera: Conjunctivae normal.  Cardiovascular:     Rate and Rhythm: Normal rate and regular rhythm.     Heart sounds: Normal heart sounds. No murmur heard.     No friction rub. No gallop.     Comments: 2+ radial pulses in the feet bilaterally Pulmonary:     Effort: Pulmonary effort is normal. No respiratory distress.     Breath sounds: Normal breath sounds.  Abdominal:     General: Bowel sounds are normal. There is no distension.     Palpations: Abdomen is soft. There is no mass.     Tenderness: There is no abdominal tenderness. There is no guarding.  Musculoskeletal:     Cervical back: Normal range of motion.     Comments: Patient appears to be in mild to moderate pain, antalgic gait noted. Lumbosacral spine area reveals no local tenderness or mass. Painful and reduced LS ROM noted. Straight leg raise is positive at 30 degrees on right he does document 88 stable to say 88 5. DTR's, motor strength and sensation normal, including heel and toe gait.  Peripheral pulses are palpable.   Skin:    General: Skin is warm and dry.  Neurological:     Mental Status: She is alert and oriented to person, place, and time.  Psychiatric:        Behavior: Behavior normal.     ED Results / Procedures / Treatments   Labs (all labs ordered are listed, but only abnormal results are displayed) Labs Reviewed - No data to display  EKG None  Radiology No results found.  Procedures Procedures    Medications Ordered in ED Medications - No data to display  ED Course/ Medical Decision Making/ A&P                                 Medical Decision Making Risk Prescription drug management.    Patient with back pain.  No neurological deficits and normal neuro exam.  Patient can walk but states is painful.  No loss of bowel or bladder control.  No concern for cauda equina.  No fever, night sweats, weight loss, h/o cancer, IVDU.  RICE protocol and pain medicine indicated and discussed with patient.   PDMP reviewed during this encounter.        Final Clinical Impression(s) / ED Diagnoses Final diagnoses:  None    Rx / DC Orders ED Discharge  Orders     None         Arthor Captain, PA-C 04/08/23 1946    Arthor Captain, PA-C 04/08/23 1946    Long, Arlyss Repress, MD 04/10/23 [phone]

## 2023-04-08 NOTE — ED Triage Notes (Signed)
 Pt right leg  andhipbrings her to ed.  Pt was supposed to go to mri, hasn't slept in 3x nights due to pain, emerg ortho doctor told pt to come to ed after hearing pts symptom.  Previous week right leg had unknown bruise on inner thigh.

## 2023-04-14 NOTE — Progress Notes (Signed)
Remote ICD transmission.   

## 2023-04-16 ENCOUNTER — Telehealth: Payer: Self-pay | Admitting: Emergency Medicine

## 2023-04-16 NOTE — Telephone Encounter (Signed)
  Marie Fair, MD 04/05/2023  6:27 PM EST     There has been a slight additional improvement in heart contraction strength since last year, but not back to normal. Overall there is an excellent improvement since first diagnosed with a weak heart muscle.    Left the information above over voicemail. Call back number given for questions

## 2023-04-30 ENCOUNTER — Encounter (HOSPITAL_COMMUNITY): Payer: Self-pay | Admitting: Sports Medicine

## 2023-04-30 ENCOUNTER — Ambulatory Visit (INDEPENDENT_AMBULATORY_CARE_PROVIDER_SITE_OTHER): Payer: Medicaid Other | Admitting: Cardiovascular Disease

## 2023-04-30 ENCOUNTER — Encounter: Payer: Self-pay | Admitting: Cardiovascular Disease

## 2023-04-30 ENCOUNTER — Ambulatory Visit (HOSPITAL_COMMUNITY)
Admission: RE | Admit: 2023-04-30 | Discharge: 2023-04-30 | Disposition: A | Discharge status: Home / Self Care | Payer: Medicaid Other | Source: Ambulatory Visit | Attending: Sports Medicine | Admitting: Sports Medicine

## 2023-04-30 VITALS — BP 120/84 | HR 79 | Ht 61.0 in | Wt 191.0 lb

## 2023-04-30 DIAGNOSIS — Z79899 Other long term (current) drug therapy: Secondary | ICD-10-CM | POA: Insufficient documentation

## 2023-04-30 DIAGNOSIS — E118 Type 2 diabetes mellitus with unspecified complications: Secondary | ICD-10-CM

## 2023-04-30 DIAGNOSIS — E7841 Elevated Lipoprotein(a): Secondary | ICD-10-CM | POA: Diagnosis present

## 2023-04-30 DIAGNOSIS — M25551 Pain in right hip: Secondary | ICD-10-CM | POA: Diagnosis present

## 2023-04-30 DIAGNOSIS — I5042 Chronic combined systolic (congestive) and diastolic (congestive) heart failure: Secondary | ICD-10-CM | POA: Diagnosis present

## 2023-04-30 DIAGNOSIS — I428 Other cardiomyopathies: Secondary | ICD-10-CM | POA: Insufficient documentation

## 2023-04-30 DIAGNOSIS — I472 Ventricular tachycardia, unspecified: Secondary | ICD-10-CM | POA: Insufficient documentation

## 2023-04-30 DIAGNOSIS — I10 Essential (primary) hypertension: Secondary | ICD-10-CM | POA: Insufficient documentation

## 2023-04-30 DIAGNOSIS — E782 Mixed hyperlipidemia: Secondary | ICD-10-CM | POA: Insufficient documentation

## 2023-04-30 DIAGNOSIS — I25111 Atherosclerotic heart disease of native coronary artery with angina pectoris with documented spasm: Secondary | ICD-10-CM

## 2023-04-30 DIAGNOSIS — Z95810 Presence of automatic (implantable) cardiac defibrillator: Secondary | ICD-10-CM | POA: Diagnosis present

## 2023-04-30 DIAGNOSIS — M25561 Pain in right knee: Secondary | ICD-10-CM | POA: Diagnosis present

## 2023-04-30 MED ORDER — BISOPROLOL FUMARATE 5 MG PO TABS: 5.0000 mg | ORAL_TABLET | Freq: Every day | ORAL | 3 refills | Status: AC

## 2023-04-30 NOTE — Progress Notes (Signed)
 Cardiology Office Note:  .   Date:  04/30/2023  ID:  [name], DOB [birth year], MRN [id number] PCP: Geneva Maffucci, FNP  [facility] Providers Cardiologist:  Jerel Balding, MD    History of Present Illness: [name]   [name] is a 58 y.o. female with a longstanding history of nonischemic cardiomyopathy dating back at least to 2018, varying degrees of left ventricular systolic dysfunction (LVEF as low as 20-25% on echo 04/23/2022, nonobstructive CAD (40-50% mid LCx and mid RCA at cath 05/21/2022 and 01/08/2023) MRI showing basal anterior and basal inferoseptal mid wall stripes suggestive of possible previous myocarditis, but also an area of inferolateral subendocardial scar), LBBB, hospitalized after out of hospital cardiac arrest (monomorphic VT) 05/20/2022 with very mild elevation in troponin, s/p CRT-D ([facility], 05/22/2022), essential hypertension, DM2, HLP, CKD 3A.  Had a presentation with episode of non-STEMI in 2013 with chest pain, estimated to be due to coronary spasm, as witnessed during angiography.  She was hospitalized 01/08/2023 when she presented with chest pain.  She underwent repeat coronary angiography that did not show any worsening of her coronary stenoses, which remain moderate or mild.  She had normal cardiac index and normal filling pressures (RAP 3 mmHg, PAP 33/12, PAWP 10 mmHg, CI 2.7 L/min/m).  Echo showed LVEF 35-40% with a mild-moderately dilated LV and mild LVH, moderately dilated left atrial.  Mitral inflow showed an impaired laxation pattern (E/a 0.7, E/e' was borderline high at 14-16).  After the hospitalization October she was started on guideline directed medical therapy with Entresto  and carvedilol  instead of losartan and metoprolol  as well as Farxiga .  A follow-up echocardiogram performed on March 30, 2023 shows improvement in left ventricular systolic function with EF up to 40 to 45% and normal left heart filling pressures.  She generally felt well  since then but did have 2 ER visits.  On 01/18/2023 she had pain and swelling in her right lower extremity and went to the ER.  The ultrasound did not show evidence of DVT.  She continued to have pain in her right hip and knee and there is a scheduled MRI tomorrow.  In early January she had an upper respiratory tract infection and had a lot of coughing which led to chest pain.  Her cough improved with an albuterol  inhaler.  She was seen in the emergency room on January 8 and the pain was felt to be musculoskeletal.  She did not have elevation in a significant troponin and her BNP was quite normal at 85 during that visit.  She has not had problems with palpitations, defibrillator discharges, syncope or dizziness.  She denies shortness of breath at rest or with usual activity.  She does not have orthopnea, PND or lower extremity edema.  It is unclear why, but she is no longer taking carvedilol .  She believes that we told her to stop this.  Device interrogation shows normal function of her CRT-D. [employer] [place] device that was implanted in February 2024.  Estimated gentle longevity 6.6 years.  Capture threshold was tested on all 3 leads and found to be normal.  She has 33% atrial pacing and 99% biventricular pacing.  She has had several episodes of very brief atrial mode switch, cumulatively less than 1% burden, none of which appear to be atrial fibrillation.  There has been no meaningful ventricular tachycardia.  Her thoracic impedance/[place] has been in normal range.  The left ventricular lead is programmed with an output of 1.75 V at  0.5 ms in the D1-M2 configuration, LV first by 60 ms.  Most recent labs show borderline low potassium at 3.5, normal renal function with a creatinine 0.73.  Her lipid profile has not been rechecked since we added ezetimibe  last fall.  She is currently feeling well.  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     Studies Reviewed: .    Cardiac MRI 05/21/2022:  1.  LV is severely dilated with severe systolic dysfunction, with akinesis of the basal to mid inferolateral wall. LVEF 26 %  2.  RV normal size and systolic function.  3.  Left atrium is dilated.  4.  No significant valvular abnormalities.  5.  LGE: There is basal anterior midwall stripe and basal inferoseptal midwall stripe which can be seen in prior myocarditis. There is base to mid inferolateral and lateral subendocardial LGE 25-50% likely from prior infarct in left circumflex territory. No evidence of active edema or inflammation.   Echocardiogram 01/08/2023:  1. Left ventricular ejection fraction, by estimation, is 35 to 40%. The  left ventricle has mild to moderately decreased function. The left  ventricle demonstrates regional wall motion abnormalities (see scoring  diagram/findings for description). The left   ventricular internal cavity size was mildly to moderately dilated. There  is mild concentric left ventricular hypertrophy. Left ventricular  diastolic parameters are consistent with Grade II diastolic dysfunction  (pseudonormalization).   2. Right ventricular systolic function is low normal. The right  ventricular size is normal.   3. Left atrial size was moderately dilated.   4. The mitral valve is normal in structure. No evidence of mitral valve  regurgitation.   5. The aortic valve is normal in structure. Aortic valve regurgitation is  not visualized.   Cardiac catheterization 01/08/2023:    Prox RCA lesion is 40% stenosed.   Mid Cx lesion is 40% stenosed.   1.  Right radial arterial loop requiring balloon assisted tracking to negotiate. 2.  Mild coronary artery disease unchanged from previous study (at [facility]). 3.  Fick cardiac output of 4.8 L/min and Fick cardiac index of 2.7 liters per minute per meter squared with the following hemodynamics:             Right atrial pressure mean of 3 mmHg             Right  ventricular pressure 31/0 with an end-diastolic pressure of 8 mmHg             Wedge pressure mean of 10 mmHg             Pulmonary artery pressure of 33/12 with a mean of 18 mmHg             PVR of 2.9 [place] units             PA pulsatility index of 6.3   Recommendation: Medical therapy  NM myocardial perfusion stress test 05/31/2021: CONCLUSION: 1. No evidence of inducible ischemia. Favored attenuation artifact of the inferior wall. 2. Reduced left ventricular ejection fraction of 46%. 3. Mild global hypokinesia.  CRT-D implant 05/22/2022  Generator: Model Gallant HF  SN [id number]  RA lead: Model [id number]  SN [id number]  RV lead: Model 7122Q  SN [id number]  LV lead: Model 1456Q  SN [id number]   RA: Threshold 0.9 V @ 1.0 ms; Impedance 447 ohms; P wave 4.8  mV  RV: Threshold 0.5 V @ 0.5  ms; Impedance 622 ohms; R wave 19.2 mV  LV: Threshold 1.7 V @ 0.5 ms; Impedance 1202 ohms; R wave 10.4 mV  (D1-M2, LV first 60 ms  Mode: DDD, 60-130  VT Monitor 150 bpm  VT-2 171 bpm, ATP x 4, 36, 40 J x 2  VF 214 bpm, ATP x 1, 36, 40 J x 5   ECG: Personally reviewed the tracing from 04/01/2023 which shows atrial sensed, ventricular paced rhythm.  The pattern is a left bundle branch block, but the QRS is relatively narrow at 140 ms.  There is a diminutive R wave in lead V1.  EKG Interpretation Date/Time:    Ventricular Rate:    PR Interval:    QRS Duration:    QT Interval:    QTC Calculation:   R Axis:      Text Interpretation:           Risk Assessment/Calculations:             Physical Exam:   VS:  BP 120/84 (BP Location: Left Arm, Patient Position: Sitting, Cuff Size: Small)   Pulse 79   Ht 5' 1 (1.549 m)   Wt 191 lb (86.6 kg)   SpO2 96%   BMI 36.09 kg/m    Wt Readings from Last 3 Encounters:  04/30/23 191 lb (86.6 kg)  04/08/23 194 lb (88 kg)  04/01/23 195 lb 1.7 oz (88.5 kg)     General: Alert, oriented x3, no distress, severely obese Head: no evidence of trauma,  PERRL, EOMI, no exophtalmos or lid lag, no myxedema, no xanthelasma; normal ears, nose and oropharynx Neck: normal jugular venous pulsations and no hepatojugular reflux; brisk carotid pulses without delay and no carotid bruits Chest: clear to auscultation, no signs of consolidation by percussion or palpation, normal fremitus, symmetrical and full respiratory excursions Cardiovascular: normal position and quality of the apical impulse, regular rhythm, normal first and second heart sounds, no murmurs, rubs or gallops Abdomen: no tenderness or distention, no masses by palpation, no abnormal pulsatility or arterial bruits, normal bowel sounds, no hepatosplenomegaly Extremities: no clubbing, cyanosis or edema; 2+ radial, ulnar and brachial pulses bilaterally; 2+ right femoral, posterior tibial and dorsalis pedis pulses; 2+ left femoral, posterior tibial and dorsalis pedis pulses; no subclavian or femoral bruits Neurological: grossly nonfocal Psych: Normal mood and affect   ASSESSMENT AND PLAN: .   CHF: She is clinically euvolemic and this is supported by a very low recent BNP and abnormal thoracic impedance/corvue on her ICD even without taking loop diuretics.  NYHA functional class I.  She is on maximum dose Entresto  as well as Farxiga , but for some reason is no longer taking carvedilol .  Since she has had some issues with wheezing and bronchospasm we will restart bisoprolol  as her beta-blocker for heart failure.  In the past attempts to start spironolactone have been abandoned because of acute renal insufficiency, on 2 separate occasions.   VT: No ventricular arrhythmias seen on device download.  She has not required therapy from ICD to date.  She had cardiac arrest due to monomorphic VT, likely related to areas of scar that we have seen on her MRI.   Amiodarone : So far without clear side effects.  Need to recheck liver and thyroid  function tests in a couple of months. CMP: Likely a complex,  multifactorial problem.  For years she was thought to have hypertensive cardiomyopathy.  She clearly had an NSTEMI despite normal coronary arteries in 2013 and there is an  inferolateral subendocardial scar that may be the result of that event.  In addition she has mid wall scar that looks more like the consequence of myocarditis.  She does not have other clinical features to suggest sarcoidosis (no skin rashes, no lymphadenopathy, etc.).  Need to keep this possibility in mind and may need an FDG PET scan in the future. CRT-D: 99% BiV pacing.  There has been improvement in LV function.  Based on electrograms today we are getting decent resynchronization, even though the electrocardiogram does not show much of an R wave in lead V1. CAD:  cardiac catheterization twice in 2024 showed only minor nonobstructive disease.  She does not smoke.  It is quite possible that coronary vasospasm was the cause for her infarction in 2013, since vasospasm was seen during angiography.  We picked the carvedilol  for this reason, but she has had some issues with wheezing.  Will switch to bisoprolol .  Continue long acting nitrates. HTN: Well-controlled CKD: Has returned to normal creatinine levels even when we maximize the dose of Entresto .  In the past she had AKI when spironolactone was tried on 2 separate occasions. DM: Good glycemic control.  Empagliflozin is also prescribed for heart failure.   HLP: Even though her cardiomyopathy is probably primarily nonischemic, important to keep LDL <70. Now on max dose atorvastatin  + ezetimibe . Recheck labs in a couple of months when she comes in for her liver function tests and thyroid  function tests.  Markedly elevated LP(a) on labs from October probably explains her poor LDL response to statin.  We probably will need to investigate transition to a PCSK9 inhibitor.         Dispo:  Patient Instructions  Medication Instructions:   Stop Carvedilol      Start taking Bisoprolol  5 mg  one tablet daily   *If you need a refill on your cardiac medications before your next appointment, please call your pharmacy*   Lab Work: April  2025  TSH CMP Lipid profile If you have labs (blood work) drawn today and your tests are completely normal, you will receive your results only by: MyChart Message (if you have MyChart) OR A paper copy in the mail If you have any lab test that is abnormal or we need to change your treatment, we will call you to review the results.   Testing/Procedures:  Not needed  Follow-Up: At [facility], you and your health needs are our priority.  As part of our continuing mission to provide you with exceptional heart care, we have created designated Provider Care Teams.  These Care Teams include your primary Cardiologist (physician) and Advanced Practice Providers (APPs -  Physician Assistants and Nurse Practitioners) who all work together to provide you with the care you need, when you need it.     Your next appointment:   6 month(s)  The format for your next appointment:   In Person  Provider:   Jerel Balding, MD        Signed, Jerel Balding, MD

## 2023-04-30 NOTE — Patient Instructions (Addendum)
 Medication Instructions:   Stop Carvedilol      Start taking Bisoprolol  5 mg one tablet daily   *If you need a refill on your cardiac medications before your next appointment, please call your pharmacy*   Lab Work: April  2025  TSH CMP Lipid profile If you have labs (blood work) drawn today and your tests are completely normal, you will receive your results only by: MyChart Message (if you have MyChart) OR A paper copy in the mail If you have any lab test that is abnormal or we need to change your treatment, we will call you to review the results.   Testing/Procedures:  Not needed  Follow-Up: At [facility], you and your health needs are our priority.  As part of our continuing mission to provide you with exceptional heart care, we have created designated Provider Care Teams.  These Care Teams include your primary Cardiologist (physician) and Advanced Practice Providers (APPs -  Physician Assistants and Nurse Practitioners) who all work together to provide you with the care you need, when you need it.     Your next appointment:   6 month(s)  The format for your next appointment:   In Person  Provider:   Jerel Balding, MD

## 2023-05-05 ENCOUNTER — Encounter: Payer: Self-pay | Admitting: Cardiovascular Disease

## 2023-06-11 ENCOUNTER — Emergency Department (HOSPITAL_COMMUNITY)

## 2023-06-11 ENCOUNTER — Emergency Department (HOSPITAL_COMMUNITY): Admission: EM | Admit: 2023-06-11 | Discharge: 2023-06-11 | Disposition: A | Discharge status: Home / Self Care

## 2023-06-11 ENCOUNTER — Other Ambulatory Visit: Payer: Self-pay

## 2023-06-11 ENCOUNTER — Encounter (HOSPITAL_COMMUNITY): Payer: Self-pay | Admitting: Emergency Medicine

## 2023-06-11 DIAGNOSIS — J45909 Unspecified asthma, uncomplicated: Secondary | ICD-10-CM | POA: Insufficient documentation

## 2023-06-11 DIAGNOSIS — R0981 Nasal congestion: Secondary | ICD-10-CM | POA: Diagnosis present

## 2023-06-11 DIAGNOSIS — N189 Chronic kidney disease, unspecified: Secondary | ICD-10-CM | POA: Insufficient documentation

## 2023-06-11 DIAGNOSIS — I130 Hypertensive heart and chronic kidney disease with heart failure and stage 1 through stage 4 chronic kidney disease, or unspecified chronic kidney disease: Secondary | ICD-10-CM | POA: Insufficient documentation

## 2023-06-11 DIAGNOSIS — Z79899 Other long term (current) drug therapy: Secondary | ICD-10-CM | POA: Insufficient documentation

## 2023-06-11 DIAGNOSIS — I10 Essential (primary) hypertension: Secondary | ICD-10-CM

## 2023-06-11 DIAGNOSIS — I509 Heart failure, unspecified: Secondary | ICD-10-CM | POA: Diagnosis not present

## 2023-06-11 DIAGNOSIS — R051 Acute cough: Secondary | ICD-10-CM

## 2023-06-11 DIAGNOSIS — Z7982 Long term (current) use of aspirin: Secondary | ICD-10-CM | POA: Diagnosis not present

## 2023-06-11 DIAGNOSIS — U071 COVID-19: Secondary | ICD-10-CM | POA: Insufficient documentation

## 2023-06-11 LAB — CBC
HCT: 50.1 % — ABNORMAL HIGH (ref 36.0–46.0)
Hemoglobin: 15.4 g/dL — ABNORMAL HIGH (ref 12.0–15.0)
MCH: 27.7 pg (ref 26.0–34.0)
MCHC: 30.7 g/dL (ref 30.0–36.0)
MCV: 90.3 fL (ref 80.0–100.0)
Platelets: 178 10*3/uL (ref 150–400)
RBC: 5.55 MIL/uL — ABNORMAL HIGH (ref 3.87–5.11)
RDW: 14.3 % (ref 11.5–15.5)
WBC: 5.8 10*3/uL (ref 4.0–10.5)
nRBC: 0 % (ref 0.0–0.2)

## 2023-06-11 LAB — BASIC METABOLIC PANEL
Anion gap: 9 (ref 5–15)
BUN: 23 mg/dL — ABNORMAL HIGH (ref 6–20)
CO2: 22 mmol/L (ref 22–32)
Calcium: 9.5 mg/dL (ref 8.9–10.3)
Chloride: 106 mmol/L (ref 98–111)
Creatinine, Ser: 1.02 mg/dL — ABNORMAL HIGH (ref 0.44–1.00)
GFR, Estimated: >60 mL/min (ref >60)
Glucose, Bld: 111 mg/dL — ABNORMAL HIGH (ref 70–99)
Potassium: 3.5 mmol/L (ref 3.5–5.1)
Sodium: 137 mmol/L (ref 135–145)

## 2023-06-11 LAB — RESP PANEL BY RT-PCR (RSV, FLU A&B, COVID)  RVPGX2
Influenza A by PCR: NEGATIVE
Influenza B by PCR: NEGATIVE
Resp Syncytial Virus by PCR: NEGATIVE
SARS Coronavirus 2 by RT PCR: POSITIVE — AB

## 2023-06-11 MED ORDER — ONDANSETRON 4 MG PO TBDP: 4.0000 mg | ORAL_TABLET | Freq: Four times a day (QID) | ORAL | 0 refills | Status: DC | PRN

## 2023-06-11 MED ORDER — BISOPROLOL FUMARATE 5 MG PO TABS
5.0000 mg | ORAL_TABLET | Freq: Once | ORAL | Status: AC
  Administered 2023-06-11: 5 mg via ORAL
  Filled 2023-06-11: qty 1

## 2023-06-11 MED ORDER — ALBUTEROL SULFATE HFA 108 (90 BASE) MCG/ACT IN AERS: 1.0000 | INHALATION_SPRAY | Freq: Once | RESPIRATORY_TRACT | Status: DC

## 2023-06-11 MED ORDER — HYDROCOD POLI-CHLORPHE POLI ER 10-8 MG/5ML PO SUER: 5.0000 mL | Freq: Every evening | ORAL | 0 refills | Status: AC | PRN

## 2023-06-11 NOTE — ED Provider Notes (Signed)
 [place] EMERGENCY DEPARTMENT AT [facility] Provider Note   CSN: [id number] Arrival date & time: 06/11/23  6010     History  Chief Complaint  Patient presents with   Influenza    [name] is a 58 y.o. female with past medical history significant for hypertension, asthma, SVT, heart failure with pacemaker in place, CKD, poorly controlled hypertension who presents with concern for cough, congestion, body aches, chills with recent flu and COVID exposure.  She reports symptoms for around 3 days.  She reports some nausea yesterday.   Influenza      Home Medications Prior to Admission medications   Medication Sig Start Date End Date Taking? Authorizing Provider  chlorpheniramine-HYDROcodone (TUSSIONEX) 10-8 MG/5ML Take 5 mLs by mouth at bedtime as needed for cough. 06/11/23  Yes Giannis Corpuz H, PA-C  ondansetron (ZOFRAN-ODT) 4 MG disintegrating tablet Take 1 tablet (4 mg total) by mouth every 6 (six) hours as needed for nausea or vomiting. 06/11/23  Yes Desjuan Stearns H, PA-C  albuterol (PROVENTIL HFA;VENTOLIN HFA) 108 (90 BASE) MCG/ACT inhaler Inhale 2 puffs into the lungs every 6 (six) hours as needed for wheezing or shortness of breath. For wheezing    [provider]  amiodarone (PACERONE) 200 MG tablet Take 1 tablet (200 mg total) by mouth daily. Patient taking differently: Take 200 mg by mouth 2 (two) times daily. 09/10/22   Croitoru, Mihai, MD  aspirin EC 81 MG tablet Take 81 mg by mouth daily. 04/22/22   [provider]  atorvastatin (LIPITOR) 80 MG tablet Take 80 mg by mouth at bedtime.    [provider]  bisoprolol (ZEBETA) 5 MG tablet Take 1 tablet (5 mg total) by mouth daily. 04/30/23   Croitoru, Mihai, MD  cyclobenzaprine (FLEXERIL) 10 MG tablet Take 0.5-1 tablets (5-10 mg total) by mouth 2 (two) times daily as needed for muscle spasms. 04/08/23   Arthor Captain, PA-C  dapagliflozin propanediol (FARXIGA) 10 MG TABS  tablet Take 1 tablet (10 mg total) by mouth daily before breakfast. 01/09/23   Marguerita Merles Latif, DO  ezetimibe (ZETIA) 10 MG tablet Take 1 tablet (10 mg total) by mouth daily. 01/10/23   Marguerita Merles Latif, DO  ferrous sulfate 325 (65 FE) MG tablet Take 1 tablet (325 mg total) by mouth every morning. 01/10/23   Marguerita Merles Latif, DO  gabapentin (NEURONTIN) 400 MG capsule Take 1 capsule by mouth 3 (three) times daily. 04/22/23   [provider]  isosorbide mononitrate (IMDUR) 60 MG 24 hr tablet Take 1 tablet (60 mg total) by mouth daily. 01/09/23   Marguerita Merles Latif, DO  nitroGLYCERIN (NITROSTAT) 0.4 MG SL tablet Place 0.4 mg under the tongue every 5 (five) minutes as needed for chest pain. Patient not taking: Reported on 04/30/2023    [provider]  oxyCODONE (ROXICODONE) 5 MG immediate release tablet Take 0.5-1 tablets (2.5-5 mg total) by mouth every 4 (four) hours as needed for severe pain (pain score 7-10). Patient not taking: Reported on 04/30/2023 04/08/23   Arthor Captain, PA-C  sacubitril-valsartan (ENTRESTO) 97-103 MG Take 1 tablet by mouth 2 (two) times daily. 01/19/23   Croitoru, Mihai, MD  solifenacin (VESICARE) 5 MG tablet Take 1 tablet by mouth daily. 12/23/22   [provider]      Allergies    Celebrex [celecoxib], Amoxicillin, Penicillins, and Shellfish allergy    Review of Systems   Review of Systems  All other systems reviewed and are negative.  Physical Exam Updated Vital Signs BP (!) 166/114   Pulse 66   Temp 98.2 F (36.8 C)   Resp 16   Wt 87 kg   SpO2 96%   BMI 36.24 kg/m  Physical Exam Vitals and nursing note reviewed.  Constitutional:      General: She is not in acute distress.    Appearance: Normal appearance.  HENT:     Head: Normocephalic and atraumatic.  Eyes:     General:        Right eye: No discharge.        Left eye: No discharge.  Cardiovascular:     Rate and Rhythm: Normal rate and regular rhythm.     Heart  sounds: No murmur heard.    No friction rub. No gallop.  Pulmonary:     Effort: Pulmonary effort is normal.     Breath sounds: Normal breath sounds.     Comments: No wheezing, rhonchi, stridor, rales, she does have dry cough throughout. Abdominal:     General: Bowel sounds are normal.     Palpations: Abdomen is soft.  Skin:    General: Skin is warm and dry.     Capillary Refill: Capillary refill takes less than 2 seconds.  Neurological:     Mental Status: She is alert and oriented to person, place, and time.  Psychiatric:        Mood and Affect: Mood normal.        Behavior: Behavior normal.     ED Results / Procedures / Treatments   Labs (all labs ordered are listed, but only abnormal results are displayed) Labs Reviewed  RESP PANEL BY RT-PCR (RSV, FLU A&B, COVID)  RVPGX2 - Abnormal; Notable for the following components:      Result Value   SARS Coronavirus 2 by RT PCR POSITIVE (*)    All other components within normal limits  CBC - Abnormal; Notable for the following components:   RBC 5.55 (*)    Hemoglobin 15.4 (*)    HCT 50.1 (*)    All other components within normal limits  BASIC METABOLIC PANEL - Abnormal; Notable for the following components:   Glucose, Bld 111 (*)    BUN 23 (*)    Creatinine, Ser 1.02 (*)    All other components within normal limits    EKG None  Radiology DG Chest 2 View Result Date: 06/11/2023 CLINICAL DATA:  Shortness of breath.  Flu like symptoms. EXAM: CHEST - 2 VIEW COMPARISON:  04/01/2023 FINDINGS: The lungs are clear without focal pneumonia, edema, pneumothorax or pleural effusion. The cardiopericardial silhouette is within normal limits for size. No acute bony abnormality. Left-sided permanent pacemaker/AICD evident. IMPRESSION: No acute cardiopulmonary findings. Electronically Signed   By: Kennith Center M.D.   On: 06/11/2023 07:44    Procedures Procedures    Medications Ordered in ED Medications  bisoprolol (ZEBETA) tablet 5 mg  (5 mg Oral Given 06/11/23 0732)    ED Course/ Medical Decision Making/ A&P                                 Medical Decision Making Amount and/or Complexity of Data Reviewed Labs: ordered. Radiology: ordered.   This is a well-appearing 58yo female with PMH significant for  hypertension, asthma, SVT, heart failure with pacemaker in place, CKD, poorly controlled hypertension who presents with concern for 3 days of cough, fever, sore throat, headache,  shob.  My emergent differential diagnosis includes acute upper respiratory infection with COVID, flu, RSV versus new asthma presentation, acute bronchitis, less clinical concern for pneumonia.  Also considered other ENT emergencies, Ludwig angina, strep pharyngitis, mono, versus epiglottis, tonsillitis versus other.  This is not an exhaustive differential.  On my exam patient is overall well-appearing, they have temperature of 98.2, breathing unlabored, no tachypnea, no respiratory distress, stable oxygen saturation.  Patient without tachycardia.  She is somewhat hypertensive in the emergency department, significant diastolic hypertension, 172/112.  She reports that she has had poorly controlled hypertension for a long time, did not take her blood pressure medication today because of her illness.  She denies any chest pain.  Denies any decreased urine output, denies any new vision changes.  RVP independently reviewed by myself shows positive for covid.  CBC, BMP are overall unremarkable, bun 23, creatinine 1.02. I independently interpreted plain film chest x-ray which shows no evidence of acute intrathoracic abnormality. patient symptoms are consistent with covid.  Encouraged ibuprofen, Tylenol, rest, plenty of fluids.  Prescribe Tussionex, Zofran for additional symptom management.  Discussed extensive return precautions.  Patient discharged in stable condition at this time.  Final Clinical Impression(s) / ED Diagnoses Final diagnoses:  COVID-19  Acute  cough  Uncontrolled hypertension    Rx / DC Orders ED Discharge Orders          Ordered    chlorpheniramine-HYDROcodone (TUSSIONEX) 10-8 MG/5ML  At bedtime PRN        06/11/23 1010    ondansetron (ZOFRAN-ODT) 4 MG disintegrating tablet  Every 6 hours PRN        06/11/23 1010              Puanani Gene, [place] H, PA-C 06/11/23 1012    Coral Spikes, DO 06/11/23 1210

## 2023-06-11 NOTE — ED Triage Notes (Signed)
 Patient c/o flu like symptoms x3 days.

## 2023-06-11 NOTE — Discharge Instructions (Signed)
 Please use Tylenol or ibuprofen for pain.  You may use 600 mg ibuprofen every 6 hours or 1000 mg of Tylenol every 6 hours.  You may choose to alternate between the 2.  This would be most effective.  Not to exceed 4 g of Tylenol within 24 hours.  Not to exceed 3200 mg ibuprofen 24 hours.  He can use the nausea medication, medicated cough syrup as needed for symptom control, continue use over-the-counter cough and cold medications as needed, avoid anything with phenylephrine or pseudoephedrine that would raise your blood pressure.

## 2023-06-12 ENCOUNTER — Ambulatory Visit: Payer: No Typology Code available for payment source

## 2023-06-12 DIAGNOSIS — I428 Other cardiomyopathies: Secondary | ICD-10-CM

## 2023-06-12 LAB — CUP PACEART REMOTE DEVICE CHECK
Battery Remaining Longevity: 79 mo
Battery Remaining Percentage: 84 %
Battery Voltage: 2.99 V
Brady Statistic AP VP Percent: 36 %
Brady Statistic AP VS Percent: 1 %
Brady Statistic AS VP Percent: 63 %
Brady Statistic AS VS Percent: 1 %
Brady Statistic RA Percent Paced: 35 %
Date Time Interrogation Session: 20250320220301
HighPow Impedance: 77 Ohm
Implantable Lead Connection Status: 753985
Implantable Lead Connection Status: 753985
Implantable Lead Implant Date: 20240229
Implantable Lead Implant Date: 20240229
Implantable Lead Location: 753858
Implantable Lead Location: 753860
Implantable Pulse Generator Implant Date: 20240229
Lead Channel Impedance Value: 1050 Ohm
Lead Channel Impedance Value: 390 Ohm
Lead Channel Impedance Value: 490 Ohm
Lead Channel Pacing Threshold Amplitude: 1 V
Lead Channel Pacing Threshold Amplitude: 1 V
Lead Channel Pacing Threshold Amplitude: 1.5 V
Lead Channel Pacing Threshold Pulse Width: 0.5 ms
Lead Channel Pacing Threshold Pulse Width: 0.5 ms
Lead Channel Pacing Threshold Pulse Width: 0.5 ms
Lead Channel Sensing Intrinsic Amplitude: 12 mV
Lead Channel Sensing Intrinsic Amplitude: 5 mV
Lead Channel Setting Pacing Amplitude: 2 V
Lead Channel Setting Pacing Amplitude: 2 V
Lead Channel Setting Pacing Amplitude: 2.5 V
Lead Channel Setting Pacing Pulse Width: 0.5 ms
Lead Channel Setting Pacing Pulse Width: 0.5 ms
Lead Channel Setting Sensing Sensitivity: 0.5 mV
Pulse Gen Serial Number: 211015531
Zone Setting Status: 755011

## 2023-06-15 ENCOUNTER — Encounter: Payer: Self-pay | Admitting: Cardiovascular Disease

## 2023-06-26 ENCOUNTER — Encounter: Payer: Self-pay | Admitting: Emergency Medicine

## 2023-07-21 NOTE — Progress Notes (Signed)
 Remote ICD transmission.

## 2023-08-17 ENCOUNTER — Observation Stay (HOSPITAL_COMMUNITY)
Admission: EM | Admit: 2023-08-17 | Discharge: 2023-08-18 | Disposition: A | Discharge status: Home / Self Care | Attending: Internal Medicine | Admitting: Internal Medicine

## 2023-08-17 ENCOUNTER — Other Ambulatory Visit: Payer: Self-pay

## 2023-08-17 ENCOUNTER — Emergency Department (HOSPITAL_COMMUNITY)

## 2023-08-17 ENCOUNTER — Encounter (HOSPITAL_COMMUNITY): Payer: Self-pay | Admitting: Family Medicine

## 2023-08-17 DIAGNOSIS — E118 Type 2 diabetes mellitus with unspecified complications: Secondary | ICD-10-CM | POA: Diagnosis not present

## 2023-08-17 DIAGNOSIS — E1122 Type 2 diabetes mellitus with diabetic chronic kidney disease: Secondary | ICD-10-CM | POA: Insufficient documentation

## 2023-08-17 DIAGNOSIS — I10 Essential (primary) hypertension: Secondary | ICD-10-CM | POA: Diagnosis not present

## 2023-08-17 DIAGNOSIS — R079 Chest pain, unspecified: Secondary | ICD-10-CM | POA: Diagnosis present

## 2023-08-17 DIAGNOSIS — I130 Hypertensive heart and chronic kidney disease with heart failure and stage 1 through stage 4 chronic kidney disease, or unspecified chronic kidney disease: Secondary | ICD-10-CM | POA: Insufficient documentation

## 2023-08-17 DIAGNOSIS — I5042 Chronic combined systolic (congestive) and diastolic (congestive) heart failure: Secondary | ICD-10-CM | POA: Diagnosis not present

## 2023-08-17 DIAGNOSIS — I428 Other cardiomyopathies: Secondary | ICD-10-CM | POA: Diagnosis not present

## 2023-08-17 DIAGNOSIS — Z7982 Long term (current) use of aspirin: Secondary | ICD-10-CM | POA: Diagnosis not present

## 2023-08-17 DIAGNOSIS — I472 Ventricular tachycardia, unspecified: Secondary | ICD-10-CM | POA: Insufficient documentation

## 2023-08-17 DIAGNOSIS — N1831 Chronic kidney disease, stage 3a: Secondary | ICD-10-CM | POA: Diagnosis not present

## 2023-08-17 DIAGNOSIS — J4520 Mild intermittent asthma, uncomplicated: Secondary | ICD-10-CM | POA: Diagnosis not present

## 2023-08-17 LAB — HCG, SERUM, QUALITATIVE: Preg, Serum: NEGATIVE

## 2023-08-17 LAB — BASIC METABOLIC PANEL WITH GFR
Anion gap: 7 (ref 5–15)
BUN: 34 mg/dL — ABNORMAL HIGH (ref 6–20)
CO2: 22 mmol/L (ref 22–32)
Calcium: 9.1 mg/dL (ref 8.9–10.3)
Chloride: 109 mmol/L (ref 98–111)
Creatinine, Ser: 1.4 mg/dL — ABNORMAL HIGH (ref 0.44–1.00)
GFR, Estimated: 43 mL/min — ABNORMAL LOW (ref >60)
Glucose, Bld: 95 mg/dL (ref 70–99)
Potassium: 4.1 mmol/L (ref 3.5–5.1)
Sodium: 138 mmol/L (ref 135–145)

## 2023-08-17 LAB — CBC
HCT: 42.4 % (ref 36.0–46.0)
Hemoglobin: 13.2 g/dL (ref 12.0–15.0)
MCH: 28.4 pg (ref 26.0–34.0)
MCHC: 31.1 g/dL (ref 30.0–36.0)
MCV: 91.2 fL (ref 80.0–100.0)
Platelets: 159 10*3/uL (ref 150–400)
RBC: 4.65 MIL/uL (ref 3.87–5.11)
RDW: 14.6 % (ref 11.5–15.5)
WBC: 5.4 10*3/uL (ref 4.0–10.5)
nRBC: 0 % (ref 0.0–0.2)

## 2023-08-17 LAB — BRAIN NATRIURETIC PEPTIDE: B Natriuretic Peptide: 166.2 pg/mL — ABNORMAL HIGH (ref 0.0–100.0)

## 2023-08-17 LAB — TROPONIN I (HIGH SENSITIVITY)
Troponin I (High Sensitivity): 15 ng/L (ref <18)
Troponin I (High Sensitivity): 16 ng/L (ref <18)

## 2023-08-17 LAB — GLUCOSE, CAPILLARY: Glucose-Capillary: 135 mg/dL — ABNORMAL HIGH (ref 70–99)

## 2023-08-17 MED ORDER — ACETAMINOPHEN 325 MG PO TABS: 650.0000 mg | ORAL_TABLET | ORAL | Status: DC | PRN

## 2023-08-17 MED ORDER — OXYCODONE HCL 5 MG PO TABS: 5.0000 mg | ORAL_TABLET | ORAL | Status: DC | PRN

## 2023-08-17 MED ORDER — ENOXAPARIN SODIUM 40 MG/0.4ML IJ SOSY
40.0000 mg | PREFILLED_SYRINGE | INTRAMUSCULAR | Status: DC
  Administered 2023-08-17: 40 mg via SUBCUTANEOUS
  Filled 2023-08-17: qty 0.4

## 2023-08-17 MED ORDER — INSULIN ASPART 100 UNIT/ML IJ SOLN: 0.0000 [IU] | Freq: Three times a day (TID) | INTRAMUSCULAR | Status: DC

## 2023-08-17 MED ORDER — PROCHLORPERAZINE EDISYLATE 10 MG/2ML IJ SOLN: 5.0000 mg | Freq: Four times a day (QID) | INTRAMUSCULAR | Status: DC | PRN

## 2023-08-17 MED ORDER — AMIODARONE HCL 200 MG PO TABS
200.0000 mg | ORAL_TABLET | Freq: Every day | ORAL | Status: DC
  Administered 2023-08-18: 200 mg via ORAL
  Filled 2023-08-17: qty 1

## 2023-08-17 MED ORDER — BISOPROLOL FUMARATE 5 MG PO TABS
5.0000 mg | ORAL_TABLET | Freq: Every day | ORAL | Status: DC
  Administered 2023-08-18: 5 mg via ORAL
  Filled 2023-08-17: qty 1

## 2023-08-17 MED ORDER — FESOTERODINE FUMARATE ER 4 MG PO TB24
4.0000 mg | ORAL_TABLET | Freq: Every day | ORAL | Status: DC
  Administered 2023-08-18: 4 mg via ORAL
  Filled 2023-08-17: qty 1

## 2023-08-17 MED ORDER — ATORVASTATIN CALCIUM 40 MG PO TABS
80.0000 mg | ORAL_TABLET | Freq: Every day | ORAL | Status: DC
  Administered 2023-08-18: 80 mg via ORAL
  Filled 2023-08-17: qty 2

## 2023-08-17 MED ORDER — INSULIN ASPART 100 UNIT/ML IJ SOLN: 0.0000 [IU] | Freq: Every day | INTRAMUSCULAR | Status: DC

## 2023-08-17 MED ORDER — ISOSORBIDE MONONITRATE ER 60 MG PO TB24
60.0000 mg | ORAL_TABLET | Freq: Every day | ORAL | Status: DC
  Administered 2023-08-18: 60 mg via ORAL
  Filled 2023-08-17: qty 1

## 2023-08-17 MED ORDER — ASPIRIN 81 MG PO TBEC
81.0000 mg | DELAYED_RELEASE_TABLET | Freq: Every day | ORAL | Status: DC
  Administered 2023-08-18: 81 mg via ORAL
  Filled 2023-08-17: qty 1

## 2023-08-17 NOTE — ED Triage Notes (Signed)
 Pt reports pain around pacemaker site radiating into left arm. Was feeling some shob when it started. Denies jaw pain, back pain. Did not get any alert from pacemaker app

## 2023-08-17 NOTE — ED Provider Notes (Signed)
 [place] [facility] AT [facility] Provider Note   CSN: [id number] Arrival date & time: 08/17/23  1345     History  Chief Complaint  Patient presents with   Chest Pain    [name] is a 58 y.o. female.  58 year old female presenting with chest/left arm pain.  Patient was at work today Copy when she began to experience what she describes as a "tightness around the side of my pacemaker and my left arm", with associated shortness of breath.  Shortness of breath has since resolved and pain is primarily in patient's left anterior chest/arm, she denies nausea/vomiting/abdominal pain/syncope.  Patient had cardiac arrest of labor years ago, ICD/pacemaker has been in place since that time.  Patient is followed by cardiology for heart failure with reduced ejection fraction, she does feel that she has been becoming more short of breath lately, this occurs at rest and with exertion, last night she had to sit up in bed because she was feeling so short of breath.   Chest Pain      Home Medications Prior to Admission medications   Medication Sig Start Date End Date Taking? Authorizing Provider  albuterol  (PROVENTIL  HFA;VENTOLIN  HFA) 108 (90 BASE) MCG/ACT inhaler Inhale 2 puffs into the lungs every 6 (six) hours as needed for wheezing or shortness of breath. For wheezing    [provider]  amiodarone  (PACERONE ) 200 MG tablet Take 1 tablet (200 mg total) by mouth daily. Patient taking differently: Take 200 mg by mouth 2 (two) times daily. 09/10/22   Croitoru, Mihai, MD  aspirin  EC 81 MG tablet Take 81 mg by mouth daily. 04/22/22   [provider]  atorvastatin  (LIPITOR) 80 MG tablet Take 80 mg by mouth at bedtime.    [provider]  bisoprolol  (ZEBETA ) 5 MG tablet Take 1 tablet (5 mg total) by mouth daily. 04/30/23   Croitoru, Mihai, MD  chlorpheniramine-HYDROcodone  (TUSSIONEX) 10-8 MG/5ML Take 5 mLs by mouth at bedtime as needed  for cough. 06/11/23   Prosperi, Christian H, PA-C  cyclobenzaprine  (FLEXERIL ) 10 MG tablet Take 0.5-1 tablets (5-10 mg total) by mouth 2 (two) times daily as needed for muscle spasms. 04/08/23   Harris, Abigail, PA-C  dapagliflozin  propanediol (FARXIGA ) 10 MG TABS tablet Take 1 tablet (10 mg total) by mouth daily before breakfast. 01/09/23   Sheikh, Omair Latif, DO  ezetimibe  (ZETIA ) 10 MG tablet Take 1 tablet (10 mg total) by mouth daily. 01/10/23   Sheikh, Omair Latif, DO  ferrous sulfate  325 (65 FE) MG tablet Take 1 tablet (325 mg total) by mouth every morning. 01/10/23   Aura Leeds Latif, DO  gabapentin  (NEURONTIN ) 400 MG capsule Take 1 capsule by mouth 3 (three) times daily. 04/22/23   [provider]  isosorbide  mononitrate (IMDUR ) 60 MG 24 hr tablet Take 1 tablet (60 mg total) by mouth daily. 01/09/23   Sheikh, Jonel Nephew Latif, DO  nitroGLYCERIN  (NITROSTAT ) 0.4 MG SL tablet Place 0.4 mg under the tongue every 5 (five) minutes as needed for chest pain. Patient not taking: Reported on 04/30/2023    [provider]  ondansetron  (ZOFRAN -ODT) 4 MG disintegrating tablet Take 1 tablet (4 mg total) by mouth every 6 (six) hours as needed for nausea or vomiting. 06/11/23   Prosperi, Christian H, PA-C  oxyCODONE  (ROXICODONE ) 5 MG immediate release tablet Take 0.5-1 tablets (2.5-5 mg total) by mouth every 4 (four) hours as needed for severe pain (pain score 7-10). Patient not taking:  Reported on 04/30/2023 04/08/23   Harris, Abigail, PA-C  sacubitril -valsartan  (ENTRESTO ) 97-103 MG Take 1 tablet by mouth 2 (two) times daily. 01/19/23   Croitoru, Mihai, MD  solifenacin (VESICARE) 5 MG tablet Take 1 tablet by mouth daily. 12/23/22   [provider]      Allergies    Celebrex  [celecoxib ], Amoxicillin, Penicillins, and Shellfish allergy    Review of Systems   Review of Systems  Cardiovascular:  Positive for chest pain.    Physical Exam Updated Vital Signs BP (!) 149/88 (BP Location:  Right Arm)   Pulse 60   Temp 98.1 F (36.7 C) (Oral)   Resp 16   Ht 5\' 1"  (1.549 m)   Wt 86.6 kg   SpO2 100%   BMI 36.09 kg/m  Physical Exam Vitals and nursing note reviewed.  HENT:     Head: Normocephalic.  Cardiovascular:     Rate and Rhythm: Normal rate and regular rhythm.     Pulses:          Radial pulses are 2+ on the right side and 2+ on the left side.     Heart sounds: Normal heart sounds.  Pulmonary:     Effort: Pulmonary effort is normal.     Breath sounds: Normal breath sounds.  Abdominal:     Palpations: Abdomen is soft.     Tenderness: There is no abdominal tenderness. There is no guarding.  Musculoskeletal:        General: Normal range of motion.     Cervical back: Normal range of motion.     Right lower leg: No edema.     Left lower leg: No edema.     Comments: Moves all extremities spontaneously without difficulty  Skin:    General: Skin is warm and dry.  Neurological:     Mental Status: She is alert.     ED Results / Procedures / Treatments   Labs (all labs ordered are listed, but only abnormal results are displayed) Labs Reviewed  BASIC METABOLIC PANEL WITH GFR - Abnormal; Notable for the following components:      Result Value   BUN 34 (*)    Creatinine, Ser 1.40 (*)    GFR, Estimated 43 (*)    All other components within normal limits  BRAIN NATRIURETIC PEPTIDE - Abnormal; Notable for the following components:   B Natriuretic Peptide 166.2 (*)    All other components within normal limits  CBC  HCG, SERUM, QUALITATIVE  TROPONIN I (HIGH SENSITIVITY)  TROPONIN I (HIGH SENSITIVITY)    EKG EKG Interpretation Date/Time:  Monday Aug 17 2023 16:02:25 EDT Ventricular Rate:  60 PR Interval:  199 QRS Duration:  150 QT Interval:  521 QTC Calculation: 521 R Axis:   98  Text Interpretation: Sinus rhythm Inferior infarct, old Anterior infarct, old Prolonged QT interval No significant change since last tracing Confirmed by Trish Furl [phone]) on  08/17/2023 4:10:56 PM  Radiology DG Chest 2 View Result Date: 08/17/2023 EXAM: 2 VIEW(S) XRAY OF THE CHEST 08/17/2023 02:31:35 PM COMPARISON: 2-view chest x-ray 06/11/2023. CLINICAL HISTORY: Chest pain. Per Triage: Pt reports pain around pacemaker site radiating into left arm. Was feeling some SOB when it started. Denies jaw pain, back pain. Did not get any alert from pacemaker app. FINDINGS: LUNGS AND PLEURA: Mild pulmonary vascular congestion is now present. No focal pulmonary opacity. No pleural effusion. No pneumothorax. HEART AND MEDIASTINUM: Cardiomegaly is stable. Cardiac leads are stable. BONES AND SOFT TISSUES: Degenerative  changes in the thoracic spine are stable. No acute osseous abnormality. IMPRESSION: 1. Mild pulmonary vascular congestion. 2. Stable cardiomegaly. Electronically signed by: Audree Leas MD 08/17/2023 02:43 PM EDT RP Workstation: [id number]    Procedures Procedures    Medications Ordered in ED Medications - No data to display  ED Course/ Medical Decision Making/ A&P                                 Medical Decision Making This patient presents to the ED for concern of chest pain, this involves an extensive number of treatment options, and is a complaint that carries with it a high risk of complications and morbidity.  The differential diagnosis includes ACS, unstable vs stable angina, HF exacerbation, musculoskeletal pain.    Co morbidities that complicate the patient evaluation  HFrEF with ICD   Additional history obtained:  Additional history obtained from record review External records from outside source obtained and reviewed including cardiology notes, hospital discharge summaries   Lab Tests:  I Ordered, and personally interpreted labs.  The pertinent results include: CBC unremarkable, BMP notable for worsening kidney function as compared to recent baseline, 1.40 today in comparison to 1.02 2 months ago but was found to have creatinine of 1.47  6 months ago upon Care Everywhere review, she does have a diagnosis of stage III CKD.  Initial Troponin 15, repeat troponin pending.  BNP 166.2, this is slightly elevated as compared to her most recent assessment 4 months ago.   Imaging Studies ordered:  I ordered imaging studies including CXR  I independently visualized and interpreted imaging which showed Mild pulmonary vascular congestion. Stable cardiomegaly.  I agree with the radiologist interpretation   Cardiac Monitoring: / EKG:  The patient was maintained on a cardiac monitor.  I personally viewed and interpreted the cardiac monitored which showed an underlying rhythm of: normal sinus rhythm   Consultations Obtained:  I requested consultation with the cardiologist Dr. Stann Earnest,  and discussed lab and imaging findings as well as pertinent plan - they recommend: admission, cardiology will assess patient tomorrow.  Discussed this with Dr. Brice Campi who is in agreement this plan.   Problem List / ED Course / Critical interventions / Medication management  I have reviewed the patients home medicines and have made adjustments as needed   Social Determinants of Health:  Tobacco use   Test / Admission - Considered:  Heart score of 4 places patient at risk of major adverse cardiac event.  Physical exam is largely reassuring, normal S1/S2, lung sounds are clear to auscultation bilaterally without adventitious sounds, no evidence of fluid overload.  Cardiac workup is reassuring, as above.  Upon reassessment of this patient her pain has resolved, however was present for several hours.  Given patient's history of cardiac arrest secondary to heart failure with reduced ejection fraction requiring ICD placement, nature of her symptoms today with associated shortness of breath, I am concerned that this patient is at risk of experiencing another major adverse cardiac event.  I spoke with cardiology Dr. Stann Earnest who agrees that patient would be  appropriate for admission, they plan to see and evaluate this patient tomorrow morning, given her reassuring workup she does not require transfer to [facility].  Spoke with hospitalist team Dr. Brice Campi who is in agreement with this plan, patient is appropriate for admission at this time.    Amount and/or Complexity of Data Reviewed Labs: ordered.  Radiology: ordered.  Risk Decision regarding hospitalization.           Final Clinical Impression(s) / ED Diagnoses Final diagnoses:  Chest pain, unspecified type    Rx / DC Orders ED Discharge Orders     None         Adolm Ahumada 08/17/23 1750    Trish Furl, MD 08/18/23 1105

## 2023-08-17 NOTE — H&P (Signed)
 History and Physical    Magie Ciampa NWG:[id number] DOB: 1966/01/24 DOA: 08/17/2023  PCP: Harlan Liberty, FNP   Patient coming from: Home   Chief Complaint: Chest pain   HPI: [name] is a 58 y.o. female with medical history significant for type 2 diabetes mellitus, hypertension, CKD 3A, nonobstructive CAD, chronic combined systolic and diastolic CHF, and history of VT arrest status post ICD placement who presents with chest pain.  Patient was at work and physically active today when she developed pain in the left chest that radiated to her left arm.  Symptoms began to ease off and eventually resolved within approximately an hour.  There was dyspnea associated with this but no nausea or diaphoresis.  She denies any recent leg swelling.  ED Course: Upon arrival to the ED, patient is found to be afebrile and saturating well on room air with normal HR and stable BP.  EKG demonstrates sinus rhythm.  Chest x-ray notable for stable cardiomegaly and mild pulmonary vascular congestion.  Labs are most notable for creatinine 1.40, normal WBC, normal hemoglobin, normal troponin, and BNP 166.  Cardiology (Dr. Stann Earnest) was consulted by the ED PA and recommended medical admission.  Review of Systems:  All other systems reviewed and apart from HPI, are negative.  Past Medical History:  Diagnosis Date   Abnormal TSH    10/2011 - additional hormones pending   Anxiety    Asthma    Atypical chest pain    recent low risk myoview    Coronary artery spasm (HCC)    a. NSTEMI 10/2011 with no CAD by cath but evidence of spasm when injecting left coronary artery.    Hyperlipidemia    Hypertension    LBBB (left bundle branch block)    Noncompliance    SVT (supraventricular tachycardia) (HCC) 10/22/10   wide complex tachycardia of same QRS as intinsic LBBB    Past Surgical History:  Procedure Laterality Date   LEFT HEART CATHETERIZATION WITH CORONARY ANGIOGRAM N/A 11/20/2011   Procedure: LEFT  HEART CATHETERIZATION WITH CORONARY ANGIOGRAM;  Surgeon: Arnoldo Lapping, MD;  Location: [facility] CATH LAB;  Service: Cardiovascular;  Laterality: N/A;   RIGHT/LEFT HEART CATH AND CORONARY ANGIOGRAPHY N/A 01/08/2023   Procedure: RIGHT/LEFT HEART CATH AND CORONARY ANGIOGRAPHY;  Surgeon: Kyra Phy, MD;  Location: [facility] INVASIVE CV LAB;  Service: Cardiovascular;  Laterality: N/A;   TUBAL LIGATION      Social History:   reports that she has been smoking. She has never used smokeless tobacco. She reports current alcohol use of about 5.0 standard drinks of alcohol per week. She reports current drug use. Drug: Marijuana.  Allergies  Allergen Reactions   Celebrex  [Celecoxib ] Rash   Amoxicillin Hives   Penicillins Hives and Itching   Shellfish Allergy Other (See Comments)    "It makes my tongue swell and itch but I eat it anyway". She usually takes a Benadryl before eating it.   Shellfish-Derived Products Other (See Comments)    "It makes my tongue swell and itch but I eat it anyway". She usually takes a Benadryl before eating it.    Family History  Problem Relation Age of Onset   Stroke Mother      Prior to Admission medications   Medication Sig Start Date End Date Taking? Authorizing Provider  albuterol  (PROVENTIL  HFA;VENTOLIN  HFA) 108 (90 BASE) MCG/ACT inhaler Inhale 2 puffs into the lungs every 6 (six) hours as needed for wheezing or shortness of breath. For wheezing  [provider]  amiodarone  (PACERONE ) 200 MG tablet Take 1 tablet (200 mg total) by mouth daily. Patient taking differently: Take 200 mg by mouth 2 (two) times daily. 09/10/22   Croitoru, Mihai, MD  aspirin  EC 81 MG tablet Take 81 mg by mouth daily. 04/22/22   [provider]  atorvastatin  (LIPITOR) 80 MG tablet Take 80 mg by mouth at bedtime.    [provider]  bisoprolol  (ZEBETA ) 5 MG tablet Take 1 tablet (5 mg total) by mouth daily. 04/30/23   Croitoru, Mihai, MD  chlorpheniramine-HYDROcodone   (TUSSIONEX) 10-8 MG/5ML Take 5 mLs by mouth at bedtime as needed for cough. 06/11/23   Prosperi, Christian H, PA-C  cyclobenzaprine  (FLEXERIL ) 10 MG tablet Take 0.5-1 tablets (5-10 mg total) by mouth 2 (two) times daily as needed for muscle spasms. 04/08/23   Harris, Abigail, PA-C  dapagliflozin  propanediol (FARXIGA ) 10 MG TABS tablet Take 1 tablet (10 mg total) by mouth daily before breakfast. 01/09/23   Sheikh, Omair Latif, DO  ezetimibe  (ZETIA ) 10 MG tablet Take 1 tablet (10 mg total) by mouth daily. 01/10/23   Sheikh, Omair Latif, DO  ferrous sulfate  325 (65 FE) MG tablet Take 1 tablet (325 mg total) by mouth every morning. 01/10/23   Sheikh, Omair Latif, DO  gabapentin  (NEURONTIN ) 400 MG capsule Take 1 capsule by mouth 3 (three) times daily. 04/22/23   [provider]  isosorbide  mononitrate (IMDUR ) 60 MG 24 hr tablet Take 1 tablet (60 mg total) by mouth daily. 01/09/23   Aura Leeds Latif, DO  nitroGLYCERIN  (NITROSTAT ) 0.4 MG SL tablet Place 0.4 mg under the tongue every 5 (five) minutes as needed for chest pain. Patient not taking: Reported on 04/30/2023    [provider]  ondansetron  (ZOFRAN -ODT) 4 MG disintegrating tablet Take 1 tablet (4 mg total) by mouth every 6 (six) hours as needed for nausea or vomiting. 06/11/23   Prosperi, Christian H, PA-C  oxyCODONE  (ROXICODONE ) 5 MG immediate release tablet Take 0.5-1 tablets (2.5-5 mg total) by mouth every 4 (four) hours as needed for severe pain (pain score 7-10). Patient not taking: Reported on 04/30/2023 04/08/23   Harris, Abigail, PA-C  sacubitril -valsartan  (ENTRESTO ) 97-103 MG Take 1 tablet by mouth 2 (two) times daily. 01/19/23   Croitoru, Mihai, MD  solifenacin (VESICARE) 5 MG tablet Take 1 tablet by mouth daily. 12/23/22   [provider]    Physical Exam: Vitals:   08/17/23 1408 08/17/23 1530 08/17/23 1600 08/17/23 1630  BP:  (!) 134/109 (!) 142/105 (!) 144/100  Pulse:  61 60 60  Resp:  14 13 14   Temp:       TempSrc:      SpO2:  95% 97% 96%  Weight: 86.6 kg     Height: 5\' 1"  (1.549 m)       Constitutional: NAD, no pallor or diaphoresis   Eyes: PERTLA, lids and conjunctivae normal ENMT: Mucous membranes are moist. Posterior pharynx clear of any exudate or lesions.   Neck: supple, no masses  Respiratory: no wheezing, no crackles. No accessory muscle use.  Cardiovascular: S1 & S2 heard, regular rate and rhythm. No JVD. Abdomen: No tenderness, soft. Bowel sounds active.  Musculoskeletal: no clubbing / cyanosis. No joint deformity upper and lower extremities.   Skin: no significant rashes, lesions, ulcers. Warm, dry, well-perfused. Neurologic: CN 2-12 grossly intact. Moving all extremities. Alert and oriented.  Psychiatric: Calm. Cooperative.    Labs and Imaging on Admission: I have personally reviewed following labs  and imaging studies  CBC: Recent Labs  Lab 08/17/23 1517  WBC 5.4  HGB 13.2  HCT 42.4  MCV 91.2  PLT 159   Basic Metabolic Panel: Recent Labs  Lab 08/17/23 1517  NA 138  K 4.1  CL 109  CO2 22  GLUCOSE 95  BUN 34*  CREATININE 1.40*  CALCIUM  9.1   GFR: Estimated Creatinine Clearance: 43.2 mL/min (A) (by C-G formula based on SCr of 1.4 mg/dL (H)). Liver Function Tests: No results for input(s): "AST", "ALT", "ALKPHOS", "BILITOT", "PROT", "ALBUMIN" in the last 168 hours. No results for input(s): "LIPASE", "AMYLASE" in the last 168 hours. No results for input(s): "AMMONIA" in the last 168 hours. Coagulation Profile: No results for input(s): "INR", "PROTIME" in the last 168 hours. Cardiac Enzymes: No results for input(s): "CKTOTAL", "CKMB", "CKMBINDEX", "TROPONINI" in the last 168 hours. BNP (last 3 results) No results for input(s): "PROBNP" in the last 8760 hours. HbA1C: No results for input(s): "HGBA1C" in the last 72 hours. CBG: No results for input(s): "GLUCAP" in the last 168 hours. Lipid Profile: No results for input(s): "CHOL", "HDL", "LDLCALC",  "TRIG", "CHOLHDL", "LDLDIRECT" in the last 72 hours. Thyroid Function Tests: No results for input(s): "TSH", "T4TOTAL", "FREET4", "T3FREE", "THYROIDAB" in the last 72 hours. Anemia Panel: No results for input(s): "VITAMINB12", "FOLATE", "FERRITIN", "TIBC", "IRON ", "RETICCTPCT" in the last 72 hours. Urine analysis:    Component Value Date/Time   COLORURINE YELLOW 11/19/2009 1344   APPEARANCEUR CLEAR 11/19/2009 1344   LABSPEC 1.012 11/19/2009 1344   PHURINE 7.0 11/19/2009 1344   GLUCOSEU NEGATIVE 11/19/2009 1344   HGBUR NEGATIVE 11/19/2009 1344   BILIRUBINUR NEGATIVE 11/19/2009 1344   KETONESUR NEGATIVE 11/19/2009 1344   PROTEINUR NEGATIVE 11/19/2009 1344   UROBILINOGEN 0.2 11/19/2009 1344   NITRITE NEGATIVE 11/19/2009 1344   LEUKOCYTESUR  11/19/2009 1344    NEGATIVE MICROSCOPIC NOT DONE ON URINES WITH NEGATIVE PROTEIN, BLOOD, LEUKOCYTES, NITRITE, OR GLUCOSE <1000 mg/dL.   Sepsis Labs: @LABRCNTIP (procalcitonin:4,lacticidven:4) )No results found for this or any previous visit (from the past 240 hours).   Radiological Exams on Admission: DG Chest 2 View Result Date: 08/17/2023 EXAM: 2 VIEW(S) XRAY OF THE CHEST 08/17/2023 02:31:35 PM COMPARISON: 2-view chest x-ray 06/11/2023. CLINICAL HISTORY: Chest pain. Per Triage: Pt reports pain around pacemaker site radiating into left arm. Was feeling some SOB when it started. Denies jaw pain, back pain. Did not get any alert from pacemaker app. FINDINGS: LUNGS AND PLEURA: Mild pulmonary vascular congestion is now present. No focal pulmonary opacity. No pleural effusion. No pneumothorax. HEART AND MEDIASTINUM: Cardiomegaly is stable. Cardiac leads are stable. BONES AND SOFT TISSUES: Degenerative changes in the thoracic spine are stable. No acute osseous abnormality. IMPRESSION: 1. Mild pulmonary vascular congestion. 2. Stable cardiomegaly. Electronically signed by: Audree Leas MD 08/17/2023 02:43 PM EDT RP Workstation: [id number]    EKG:  Independently reviewed. Sinus rhythm.   Assessment/Plan   1. Chest pain  - Continue cardiac monitoring, trend troponin, continue ASA, statin, and beta-blocker, follow-up on cardiology recommendations    2. Chronic combined systolic & diastolic CHF  - Appears compensated  - Hold Entresto  and Farxiga  for now given increased creatinine, monitor weight and I/Os    3. CKD 3A  - SCr is 1.40 on admission; baseline may be closer to 1  - Hold Entresto  and Farxiga  initially, renally-dose medications, repeat chem panel in am    4. Type II DM  - Check CBGs and use low-intensity SSI for now  5. Hx of VT  - S/p ICD placement, on amiodarone    6. Asthma  - Not in exacerbation  - Albuterol  as-needed    DVT prophylaxis: Lovenox   Code Status: Full  Level of Care: Level of care: Telemetry Family Communication: Family at bedside   Disposition Plan:  Patient is from: Home  Anticipated d/c is to: Home  Anticipated d/c date is: 5/27 or 08/19/23  Patient currently: Pending repeat troponin, cardiac monitoring, cardiology consultation  Consults called: Cardiology  Admission status: Observation     Walton Guppy, MD [facility]  08/17/2023, 6:25 PM

## 2023-08-17 NOTE — Plan of Care (Signed)

## 2023-08-18 DIAGNOSIS — I428 Other cardiomyopathies: Secondary | ICD-10-CM | POA: Diagnosis not present

## 2023-08-18 DIAGNOSIS — R0789 Other chest pain: Secondary | ICD-10-CM | POA: Diagnosis not present

## 2023-08-18 DIAGNOSIS — R071 Chest pain on breathing: Secondary | ICD-10-CM

## 2023-08-18 LAB — HEPATIC FUNCTION PANEL
ALT: 13 U/L (ref 0–44)
AST: 19 U/L (ref 15–41)
Albumin: 3.4 g/dL — ABNORMAL LOW (ref 3.5–5.0)
Alkaline Phosphatase: 57 U/L (ref 38–126)
Bilirubin, Direct: 0.1 mg/dL (ref 0.0–0.2)
Indirect Bilirubin: 0.4 mg/dL (ref 0.3–0.9)
Total Bilirubin: 0.5 mg/dL (ref 0.0–1.2)
Total Protein: 6 g/dL — ABNORMAL LOW (ref 6.5–8.1)

## 2023-08-18 LAB — BASIC METABOLIC PANEL WITH GFR
Anion gap: 9 (ref 5–15)
BUN: 32 mg/dL — ABNORMAL HIGH (ref 6–20)
CO2: 20 mmol/L — ABNORMAL LOW (ref 22–32)
Calcium: 9.1 mg/dL (ref 8.9–10.3)
Chloride: 109 mmol/L (ref 98–111)
Creatinine, Ser: 1.32 mg/dL — ABNORMAL HIGH (ref 0.44–1.00)
GFR, Estimated: 47 mL/min — ABNORMAL LOW (ref >60)
Glucose, Bld: 99 mg/dL (ref 70–99)
Potassium: 3.9 mmol/L (ref 3.5–5.1)
Sodium: 138 mmol/L (ref 135–145)

## 2023-08-18 LAB — HEMOGLOBIN A1C
Hgb A1c MFr Bld: 6 % — ABNORMAL HIGH (ref 4.8–5.6)
Mean Plasma Glucose: 125.5 mg/dL

## 2023-08-18 LAB — TSH: TSH: 3.625 u[IU]/mL (ref 0.350–4.500)

## 2023-08-18 LAB — D-DIMER, QUANTITATIVE: D-Dimer, Quant: <0.27 ug{FEU}/mL (ref 0.00–0.50)

## 2023-08-18 LAB — GLUCOSE, CAPILLARY
Glucose-Capillary: 110 mg/dL — ABNORMAL HIGH (ref 70–99)
Glucose-Capillary: 115 mg/dL — ABNORMAL HIGH (ref 70–99)

## 2023-08-18 MED ORDER — SODIUM CHLORIDE 0.9 % IV SOLN: INTRAVENOUS | Status: DC

## 2023-08-18 MED ORDER — IPRATROPIUM-ALBUTEROL 0.5-2.5 (3) MG/3ML IN SOLN: 3.0000 mL | RESPIRATORY_TRACT | Status: DC | PRN

## 2023-08-18 MED ORDER — DAPAGLIFLOZIN PROPANEDIOL 10 MG PO TABS: 10.0000 mg | ORAL_TABLET | Freq: Every day | ORAL | Status: DC

## 2023-08-18 MED ORDER — HYDRALAZINE HCL 20 MG/ML IJ SOLN: 10.0000 mg | INTRAMUSCULAR | Status: DC | PRN

## 2023-08-18 MED ORDER — EZETIMIBE 10 MG PO TABS
10.0000 mg | ORAL_TABLET | Freq: Every day | ORAL | Status: DC
  Administered 2023-08-18: 10 mg via ORAL
  Filled 2023-08-18: qty 1

## 2023-08-18 MED ORDER — TRAZODONE HCL 50 MG PO TABS: 50.0000 mg | ORAL_TABLET | Freq: Every evening | ORAL | Status: DC | PRN

## 2023-08-18 MED ORDER — SENNOSIDES-DOCUSATE SODIUM 8.6-50 MG PO TABS
1.0000 | ORAL_TABLET | Freq: Every evening | ORAL | Status: DC | PRN
Start: 2023-08-18 — End: 2023-08-18

## 2023-08-18 MED ORDER — METOPROLOL TARTRATE 5 MG/5ML IV SOLN: 5.0000 mg | INTRAVENOUS | Status: DC | PRN

## 2023-08-18 MED ORDER — GUAIFENESIN 100 MG/5ML PO LIQD: 5.0000 mL | ORAL | Status: DC | PRN

## 2023-08-18 MED ORDER — GLUCAGON HCL RDNA (DIAGNOSTIC) 1 MG IJ SOLR: 1.0000 mg | INTRAMUSCULAR | Status: DC | PRN

## 2023-08-18 MED ORDER — ONDANSETRON HCL 4 MG/2ML IJ SOLN: 4.0000 mg | Freq: Four times a day (QID) | INTRAMUSCULAR | Status: DC | PRN

## 2023-08-18 NOTE — Discharge Summary (Signed)
 Physician Discharge Summary  [name] ZOX:[id number] DOB: Oct 07, 1965 DOA: 08/17/2023  PCP: Harlan Liberty, FNP  Admit date: 08/17/2023 Discharge date: 08/18/2023  Admitted From: Home Disposition:  Home  Recommendations for Outpatient Follow-up:  Follow up with PCP in 1-2 weeks Please obtain BMP/CBC in one week your next doctors visit.  Outpatient follow up with Cardiology.    Discharge Condition: Stable CODE STATUS: Full Diet recommendation: Diabetic  Brief/Interim Summary: Brief Narrative:  58 year old with history of DM2, HTN, CKD 3A, nonobstructive CAD, combined CHF 45%, history of VT arrest status post ICD placement admitted to the hospital for chest pain.  Chest x-ray showed pulmonary vascular congestion with mild cardiomegaly.  Admitting provider consulted cardiology.  Patient's D-dimer was negative.  Troponins are essentially flat.  EKG shows old infarct, prolonged QTc but no acute ST-T changes.  Due to prior history patient admitted to the hospital.  Cardiology team consulted.  Echocardiogram January 2025 showed EF 45%, grade 1 DD, small pericardial effusion.  Eventually cardiology cleared patient for discharge with outpatient arrangements made by their service.  Renal function is also slightly better today.  She has been advised to get outpatient repeat blood work done.   Assessment & Plan:  Principal Problem:   Chest pain Active Problems:   HYPERTENSION   Chronic combined systolic and diastolic congestive heart failure (HCC)   Stage 3a chronic kidney disease (HCC)   Type 2 diabetes mellitus with complication, without long-term current use of insulin (HCC)   Mild intermittent asthma without complication     Chest pain, resolved History of CAD Essential hypertension -Troponins are essentially flat.  EKG shows old infarct, prolonged QTc but no acute ST-T changes.  Due to prior history patient admitted to the hospital.  Cardiology team consulted.  Echocardiogram  January 2025 showed EF 45%, grade 1 DD, small pericardial effusion - Continue aspirin  and statin-D-dimer is negative   Chronic combined systolic & diastolic CHF  -Echocardiogram as mentioned above with EF of 45%.  Follows outpatient [facility] cardiology.  Their service has been consulted. - On bisoprolol , Imdur    AKI on CKD 3A  -Admission creatinine 1.4, today slightly better.  Advised to repeat outpatient with PCP next 1 week   Type II DM  -A1c 6.0.  Sliding scale and Accu-Cheks   Hx of VT  - S/p ICD placement, on amiodarone     Asthma  As needed bronchodilators    DVT prophylaxis: enoxaparin  (LOVENOX ) injection 40 mg Start: 08/17/23 2200      Code Status: Full Code Family Communication: Signother at bedside Discharge today    Subjective: No chest pain no other complaints   Examination:  General exam: Appears calm and comfortable  Respiratory system: Clear to auscultation. Respiratory effort normal. Cardiovascular system: S1 & S2 heard, RRR. No JVD, murmurs, rubs, gallops or clicks. No pedal edema. Gastrointestinal system: Abdomen is nondistended, soft and nontender. No organomegaly or masses felt. Normal bowel sounds heard. Central nervous system: Alert and oriented. No focal neurological deficits. Extremities: Symmetric 5 x 5 power. Skin: No rashes, lesions or ulcers Psychiatry: Judgement and insight appear normal. Mood & affect appropriate.    Discharge Diagnoses:  Principal Problem:   Chest pain Active Problems:   HYPERTENSION   Chronic combined systolic and diastolic congestive heart failure (HCC)   Stage 3a chronic kidney disease (HCC)   Type 2 diabetes mellitus with complication, without long-term current use of insulin (HCC)   Mild intermittent asthma without complication   Non-ischemic cardiomyopathy (HCC)  Discharge Exam: Vitals:   08/18/23 0236 08/18/23 0608  BP: (!) 145/100 (!) 145/82  Pulse: 60 63  Resp:    Temp: (!) 97.5 F (36.4 C) 97.8  F (36.6 C)  SpO2: 100% 100%   Vitals:   08/17/23 1834 08/17/23 2140 08/18/23 0236 08/18/23 0608  BP: (!) 150/112 (!) 155/92 (!) 145/100 (!) 145/82  Pulse: (!) 59 60 60 63  Resp: 18     Temp: 97.9 F (36.6 C) 97.7 F (36.5 C) (!) 97.5 F (36.4 C) 97.8 F (36.6 C)  TempSrc: Oral Oral Oral Oral  SpO2: 100% 100% 100% 100%  Weight:      Height:          Discharge Instructions   Allergies as of 08/18/2023       Reactions   Celebrex  [celecoxib ] Rash   Amoxicillin Hives   Penicillins Hives, Itching   Shellfish Allergy Other (See Comments)   "It makes my tongue swell and itch but I eat it anyway". She usually takes a Benadryl before eating it.   Shellfish-derived Products Other (See Comments)   "It makes my tongue swell and itch but I eat it anyway". She usually takes a Benadryl before eating it.        Medication List     TAKE these medications    albuterol  108 (90 Base) MCG/ACT inhaler Commonly known as: VENTOLIN  HFA Inhale 2 puffs into the lungs every 6 (six) hours as needed for wheezing or shortness of breath. For wheezing   amiodarone  200 MG tablet Commonly known as: PACERONE  Take 1 tablet (200 mg total) by mouth daily. What changed: how much to take   aspirin  EC 81 MG tablet Take 81 mg by mouth daily.   atorvastatin  80 MG tablet Commonly known as: LIPITOR Take 80 mg by mouth at bedtime.   bisoprolol  5 MG tablet Commonly known as: ZEBETA  Take 1 tablet (5 mg total) by mouth daily.   chlorpheniramine-HYDROcodone  10-8 MG/5ML Commonly known as: TUSSIONEX Take 5 mLs by mouth at bedtime as needed for cough.   cyclobenzaprine  10 MG tablet Commonly known as: FLEXERIL  Take 0.5-1 tablets (5-10 mg total) by mouth 2 (two) times daily as needed for muscle spasms.   dapagliflozin  propanediol 10 MG Tabs tablet Commonly known as: Farxiga  Take 1 tablet (10 mg total) by mouth daily before breakfast.   ezetimibe  10 MG tablet Commonly known as: ZETIA  Take 1  tablet (10 mg total) by mouth daily.   ferrous sulfate  325 (65 FE) MG tablet Take 1 tablet (325 mg total) by mouth every morning. What changed: when to take this   gabapentin  400 MG capsule Commonly known as: NEURONTIN  Take 1 capsule by mouth 2 (two) times daily.   isosorbide  mononitrate 60 MG 24 hr tablet Commonly known as: Imdur  Take 1 tablet (60 mg total) by mouth daily.   nitroGLYCERIN  0.4 MG SL tablet Commonly known as: NITROSTAT  Place 0.4 mg under the tongue every 5 (five) minutes as needed for chest pain.   ondansetron  4 MG disintegrating tablet Commonly known as: ZOFRAN -ODT Take 1 tablet (4 mg total) by mouth every 6 (six) hours as needed for nausea or vomiting.   sacubitril -valsartan  97-103 MG Commonly known as: ENTRESTO  Take 1 tablet by mouth 2 (two) times daily.   solifenacin 5 MG tablet Commonly known as: VESICARE Take 1 tablet by mouth daily.        Follow-up Information     Eduard Grad Chet Cota, NP Follow up.  Specialty: Cardiology Why: Josie Night - cardiology follow-up on Thursday September 03, 2023 2:20 PM (Arrive by 2:05 PM). Aleta Anda is one of our nurse practitioners that works with Dr. Alvis Ba. Contact information: [address] [place] [address] [phone]                Allergies  Allergen Reactions   Celebrex  [Celecoxib ] Rash   Amoxicillin Hives   Penicillins Hives and Itching   Shellfish Allergy Other (See Comments)    "It makes my tongue swell and itch but I eat it anyway". She usually takes a Benadryl before eating it.   Shellfish-Derived Products Other (See Comments)    "It makes my tongue swell and itch but I eat it anyway". She usually takes a Benadryl before eating it.    You were cared for by a hospitalist during your hospital stay. If you have any questions about your discharge medications or the care you received while you were in the hospital after you are discharged, you can call the unit and asked to speak with  the hospitalist on call if the hospitalist that took care of you is not available. Once you are discharged, your primary care physician will handle any further medical issues. Please note that no refills for any discharge medications will be authorized once you are discharged, as it is imperative that you return to your primary care physician (or establish a relationship with a primary care physician if you do not have one) for your aftercare needs so that they can reassess your need for medications and monitor your lab values.  You were cared for by a hospitalist during your hospital stay. If you have any questions about your discharge medications or the care you received while you were in the hospital after you are discharged, you can call the unit and asked to speak with the hospitalist on call if the hospitalist that took care of you is not available. Once you are discharged, your primary care physician will handle any further medical issues. Please note that NO REFILLS for any discharge medications will be authorized once you are discharged, as it is imperative that you return to your primary care physician (or establish a relationship with a primary care physician if you do not have one) for your aftercare needs so that they can reassess your need for medications and monitor your lab values.  Please request your Prim.MD to go over all Hospital Tests and Procedure/Radiological results at the follow up, please get all Hospital records sent to your Prim MD by signing hospital release before you go home.  Get CBC, CMP, 2 view Chest X ray checked  by Primary MD during your next visit or SNF MD in 5-7 days ( we routinely change or add medications that can affect your baseline labs and fluid status, therefore we recommend that you get the mentioned basic workup next visit with your PCP, your PCP may decide not to get them or add new tests based on their clinical decision)  On your next visit with your primary  care physician please Get Medicines reviewed and adjusted.  If you experience worsening of your admission symptoms, develop shortness of breath, life threatening emergency, suicidal or homicidal thoughts you must seek medical attention immediately by calling 911 or calling your MD immediately  if symptoms less severe.  You Must read complete instructions/literature along with all the possible adverse reactions/side effects for all the Medicines you take and that have been prescribed to you.  Take any new Medicines after you have completely understood and accpet all the possible adverse reactions/side effects.   Do not drive, operate heavy machinery, perform activities at heights, swimming or participation in water activities or provide baby sitting services if your were admitted for syncope or siezures until you have seen by Primary MD or a Neurologist and advised to do so again.  Do not drive when taking Pain medications.   Procedures/Studies: DG Chest 2 View Result Date: 08/17/2023 EXAM: 2 VIEW(S) XRAY OF THE CHEST 08/17/2023 02:31:35 PM COMPARISON: 2-view chest x-ray 06/11/2023. CLINICAL HISTORY: Chest pain. Per Triage: Pt reports pain around pacemaker site radiating into left arm. Was feeling some SOB when it started. Denies jaw pain, back pain. Did not get any alert from pacemaker app. FINDINGS: LUNGS AND PLEURA: Mild pulmonary vascular congestion is now present. No focal pulmonary opacity. No pleural effusion. No pneumothorax. HEART AND MEDIASTINUM: Cardiomegaly is stable. Cardiac leads are stable. BONES AND SOFT TISSUES: Degenerative changes in the thoracic spine are stable. No acute osseous abnormality. IMPRESSION: 1. Mild pulmonary vascular congestion. 2. Stable cardiomegaly. Electronically signed by: Audree Leas MD 08/17/2023 02:43 PM EDT RP Workstation: [id number]     The results of significant diagnostics from this hospitalization (including imaging, microbiology, ancillary and  laboratory) are listed below for reference.     Microbiology: No results found for this or any previous visit (from the past 240 hours).   Labs: BNP (last 3 results) Recent Labs    01/08/23 0917 04/01/23 1142 08/17/23 1517  BNP 156.8* 85.2 166.2*   Basic Metabolic Panel: Recent Labs  Lab 08/17/23 1517 08/18/23 0428  NA 138 138  K 4.1 3.9  CL 109 109  CO2 22 20*  GLUCOSE 95 99  BUN 34* 32*  CREATININE 1.40* 1.32*  CALCIUM  9.1 9.1   Liver Function Tests: Recent Labs  Lab 08/18/23 0428  AST 19  ALT 13  ALKPHOS 57  BILITOT 0.5  PROT 6.0*  ALBUMIN 3.4*   No results for input(s): "LIPASE", "AMYLASE" in the last 168 hours. No results for input(s): "AMMONIA" in the last 168 hours. CBC: Recent Labs  Lab 08/17/23 1517  WBC 5.4  HGB 13.2  HCT 42.4  MCV 91.2  PLT 159   Cardiac Enzymes: No results for input(s): "CKTOTAL", "CKMB", "CKMBINDEX", "TROPONINI" in the last 168 hours. BNP: Invalid input(s): "POCBNP" CBG: Recent Labs  Lab 08/17/23 2132 08/18/23 0753 08/18/23 1112  GLUCAP 135* 110* 115*   D-Dimer Recent Labs    08/18/23 0943  DDIMER <0.27   Hgb A1c Recent Labs    08/18/23 0428  HGBA1C 6.0*   Lipid Profile No results for input(s): "CHOL", "HDL", "LDLCALC", "TRIG", "CHOLHDL", "LDLDIRECT" in the last 72 hours. Thyroid function studies Recent Labs    08/18/23 0428  TSH 3.625   Anemia work up No results for input(s): "VITAMINB12", "FOLATE", "FERRITIN", "TIBC", "IRON ", "RETICCTPCT" in the last 72 hours. Urinalysis    Component Value Date/Time   COLORURINE YELLOW 11/19/2009 1344   APPEARANCEUR CLEAR 11/19/2009 1344   LABSPEC 1.012 11/19/2009 1344   PHURINE 7.0 11/19/2009 1344   GLUCOSEU NEGATIVE 11/19/2009 1344   HGBUR NEGATIVE 11/19/2009 1344   BILIRUBINUR NEGATIVE 11/19/2009 1344   KETONESUR NEGATIVE 11/19/2009 1344   PROTEINUR NEGATIVE 11/19/2009 1344   UROBILINOGEN 0.2 11/19/2009 1344   NITRITE NEGATIVE 11/19/2009 1344    LEUKOCYTESUR  11/19/2009 1344    NEGATIVE MICROSCOPIC NOT DONE ON URINES WITH NEGATIVE PROTEIN, BLOOD, LEUKOCYTES, NITRITE, OR GLUCOSE <1000  mg/dL.   Sepsis Labs Recent Labs  Lab 08/17/23 1517  WBC 5.4   Microbiology No results found for this or any previous visit (from the past 240 hours).   Time coordinating discharge:  I have spent 35 minutes face to face with the patient and on the ward discussing the patients care, assessment, plan and disposition with other care givers. >50% of the time was devoted counseling the patient about the risks and benefits of treatment/Discharge disposition and coordinating care.   SIGNED:   Maggie Schooner, MD  [facility] 08/18/2023, 11:47 AM   If 7PM-7AM, please contact night-coverage

## 2023-08-18 NOTE — Consult Note (Signed)
 Cardiology Consultation   Patient ID: [name] MRN: [id number]; DOB: [birth year]  Admit date: 08/17/2023 Date of Consult: 08/18/2023  PCP:  Harlan Liberty, FNP    [facility]Care Providers Cardiologist:  Luana Rumple, MD        Patient Profile:   [name] is a 58 y.o. 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  History of Present Illness:   [name] is a 58 y.o. female who was diagnosed with a NSTEMI in 2013 that was thought to be secondary to coronary spasm witnessed during angiography. Was initially diagnosed with non ischemic cardiomyopathy in 2018. LVEF was as low as 20-25% in 03/2022.  [facility] EKG strips indicated it was monomorphic VT. She was started on amiodarone  and received a CRT-D. LHC at that time showed non obstructive CAD. TTE at that time showed LVEF 25-30%. Cardiac MRI showed basal anterior midwall stripe and basal inferoseptal midwall stripe which can be seen in prior myocarditis. There is base to mid inferolateral and lateral subendocardial LGE 25-50% likely from prior infarct in left circumflex territory.  On 12/2022 the patient presented with chest pain. She underwent a cardiac catheterization that showed 40% stenosis in the proximal RCA, and 40% stenosis in the mid LCX. She was discharge home on Entresto , Carvedalol, and Farxiga .  She has been seen at the emergency department several times since this hospitalization. Once she presented for right lower extremity edema but a DVT was ruled out. In 03/2023 she presented with nonspecific chest  pain. Most recent echo on 03/2023 showed LVEF had improved to 40-45%. Was last seen by Dr Dayton Evener on 04/2023 and appeared to be stable at that time.  On interview the patient reported presenting to the ER for left sided chest pain where her pacemaker is located.  Reported that this started at work on 11:30 yesterday morning.  Pain is described as a burning aching pain.  She also had pain in her arm.   Denies feeling any electrical shocks.  The pain is worse when she lays on her left side.  The pain has come and gone but denied any current pain. denies nausea, vomiting, palpitations, lower extremity edema, fever, chills, diaphoresis.  Is able to do more than 4 metabolic equivalents of exertion on the [employer] activity status index. she has had chronic shortness of breath but it has not worsened recently.  Denies nicotine use. Drinks 2 shots of alcohol after work, regularly uses cannabis.   Labs showed Potassium 4.1, creatinine 1.4, BUN 36, High-sensitivity troponins 15>16, BNP 166.2, normal hemoglobin Chest x-ray found no acute cardiopulmonary findings  Past Medical History:  Diagnosis Date   Abnormal TSH    10/2011 - additional hormones pending   Anxiety    Asthma    Atypical chest pain    recent low risk myoview    Coronary artery spasm (HCC)    a. NSTEMI 10/2011 with no CAD by cath but evidence of spasm when injecting left coronary artery.    Hyperlipidemia    Hypertension    LBBB (left bundle branch block)    Noncompliance  SVT (supraventricular tachycardia) (HCC) 10/22/10   wide complex tachycardia of same QRS as intinsic LBBB    Past Surgical History:  Procedure Laterality Date   LEFT HEART CATHETERIZATION WITH CORONARY ANGIOGRAM N/A 11/20/2011   Procedure: LEFT HEART CATHETERIZATION WITH CORONARY ANGIOGRAM;  Surgeon: Arnoldo Lapping, MD;  Location: [facility] CATH LAB;  Service: Cardiovascular;  Laterality: N/A;   RIGHT/LEFT HEART CATH AND CORONARY ANGIOGRAPHY N/A 01/08/2023   Procedure:  RIGHT/LEFT HEART CATH AND CORONARY ANGIOGRAPHY;  Surgeon: Kyra Phy, MD;  Location: [facility] INVASIVE CV LAB;  Service: Cardiovascular;  Laterality: N/A;   TUBAL LIGATION       Home Medications:  Prior to Admission medications   Medication Sig Start Date End Date Taking? Authorizing Provider  albuterol  (PROVENTIL  HFA;VENTOLIN  HFA) 108 (90 BASE) MCG/ACT inhaler Inhale 2 puffs into the lungs every 6 (six) hours as needed for wheezing or shortness of breath. For wheezing    [provider]  amiodarone  (PACERONE ) 200 MG tablet Take 1 tablet (200 mg total) by mouth daily. Patient taking differently: Take 200 mg by mouth 2 (two) times daily. 09/10/22   Croitoru, Mihai, MD  aspirin  EC 81 MG tablet Take 81 mg by mouth daily. 04/22/22   [provider]  atorvastatin  (LIPITOR) 80 MG tablet Take 80 mg by mouth at bedtime.    [provider]  bisoprolol  (ZEBETA ) 5 MG tablet Take 1 tablet (5 mg total) by mouth daily. 04/30/23   Croitoru, Mihai, MD  chlorpheniramine-HYDROcodone  (TUSSIONEX) 10-8 MG/5ML Take 5 mLs by mouth at bedtime as needed for cough. 06/11/23   Prosperi, Christian H, PA-C  cyclobenzaprine  (FLEXERIL ) 10 MG tablet Take 0.5-1 tablets (5-10 mg total) by mouth 2 (two) times daily as needed for muscle spasms. 04/08/23   Harris, Abigail, PA-C  dapagliflozin  propanediol (FARXIGA ) 10 MG TABS tablet Take 1 tablet (10 mg total) by mouth daily before breakfast. 01/09/23   Sheikh, Omair Latif, DO  ezetimibe  (ZETIA ) 10 MG tablet Take 1 tablet (10 mg total) by mouth daily. 01/10/23   Aura Leeds Latif, DO  ferrous sulfate  325 (65 FE) MG tablet Take 1 tablet (325 mg total) by mouth every morning. 01/10/23   Aura Leeds Latif, DO  gabapentin  (NEURONTIN ) 400 MG capsule Take 1 capsule by mouth 3 (three) times daily. 04/22/23   [provider]  isosorbide  mononitrate (IMDUR ) 60 MG 24 hr tablet Take 1 tablet (60 mg total) by mouth daily. 01/09/23   Sheikh, Jonel Nephew Latif, DO   nitroGLYCERIN  (NITROSTAT ) 0.4 MG SL tablet Place 0.4 mg under the tongue every 5 (five) minutes as needed for chest pain. Patient not taking: Reported on 04/30/2023    [provider]  ondansetron  (ZOFRAN -ODT) 4 MG disintegrating tablet Take 1 tablet (4 mg total) by mouth every 6 (six) hours as needed for nausea or vomiting. 06/11/23   Prosperi, Christian H, PA-C  oxyCODONE  (ROXICODONE ) 5 MG immediate release tablet Take 0.5-1 tablets (2.5-5 mg total) by mouth every 4 (four) hours as needed for severe pain (pain score 7-10). Patient not taking: Reported on 04/30/2023 04/08/23   Harris, Abigail, PA-C  sacubitril -valsartan  (ENTRESTO ) 97-103 MG Take 1 tablet by mouth 2 (two) times daily. 01/19/23   Croitoru, Mihai, MD  solifenacin (VESICARE) 5 MG tablet Take 1 tablet by mouth daily. 12/23/22   [provider]    Inpatient Medications: Scheduled Meds:  amiodarone   200 mg Oral Daily   aspirin  EC  81 mg Oral Daily   atorvastatin   80 mg Oral  Daily   bisoprolol   5 mg Oral Daily   enoxaparin  (LOVENOX ) injection  40 mg Subcutaneous Q24H   fesoterodine  4 mg Oral Daily   insulin aspart  0-5 Units Subcutaneous QHS   insulin aspart  0-6 Units Subcutaneous TID WC   isosorbide  mononitrate  60 mg Oral Daily   Continuous Infusions:  PRN Meds: acetaminophen , oxyCODONE , prochlorperazine  Allergies:    Allergies  Allergen Reactions   Celebrex  [Celecoxib ] Rash   Amoxicillin Hives   Penicillins Hives and Itching   Shellfish Allergy Other (See Comments)    "It makes my tongue swell and itch but I eat it anyway". She usually takes a Benadryl before eating it.   Shellfish-Derived Products Other (See Comments)    "It makes my tongue swell and itch but I eat it anyway". She usually takes a Benadryl before eating it.    Social History:   Social History   Socioeconomic History   Marital status: Single    Spouse name: Not on file   Number of children: 3   Years of education: Not on file    Highest education level: Not on file  Occupational History    Comment: Unemployed  Tobacco Use   Smoking status: Every Day   Smokeless tobacco: Never   Tobacco comments:    04/30/2023 Patient still smokes daily    Patient smokes 1 blunt daily  Substance and Sexual Activity   Alcohol use: Yes    Alcohol/week: 5.0 standard drinks of alcohol    Types: 5 Cans of beer per week    Comment: socially on weekends   Drug use: Yes    Types: Marijuana   Sexual activity: Yes    Birth control/protection: None  Other Topics Concern   Not on file  Social History Narrative   Lives in [place], unemployed.  Single.  3 children ages 31,24,28.         Social Drivers of Corporate investment banker Strain: Not on File (11/18/2021)   Received from [employer]    Financial Resource Strain: 0  Food Insecurity: No Food Insecurity (08/17/2023)   Hunger Vital Sign    Worried About Running Out of Food in the Last Year: Never true    Ran Out of Food in the Last Year: Never true  Transportation Needs: No Transportation Needs (08/17/2023)   PRAPARE - Administrator, Civil Service (Medical): No    Lack of Transportation (Non-Medical): No  Physical Activity: Not on File (11/18/2021)   [facility]   Physical Activity    Physical Activity: 0  Recent Concern: Physical Activity - At Risk (11/18/2021)   Received from [place], [place]   Physical Activity    Physical Activity: 2  Stress: Not on File (11/18/2021)   Received from [facility]   Stress    Stress: 0  Social Connections: Not on File (11/18/2021)   Received from [facility]   Social Connections    Connectedness: 0  Intimate Partner Violence: Not At Risk (08/17/2023)   Humiliation, Afraid, Rape, and Kick questionnaire    Fear of Current or Ex-Partner: No    Emotionally Abused: No    Physically Abused: No    Sexually Abused: No    Family History:    Family History  Problem Relation Age of Onset   Stroke Mother       ROS:  Please see the history of present illness.   All other ROS reviewed  and negative.     Physical Exam/Data:   Vitals:   08/17/23 1834 08/17/23 2140 08/18/23 0236 08/18/23 0608  BP: (!) 150/112 (!) 155/92 (!) 145/100 (!) 145/82  Pulse: (!) 59 60 60 63  Resp: 18     Temp: 97.9 F (36.6 C) 97.7 F (36.5 C) (!) 97.5 F (36.4 C) 97.8 F (36.6 C)  TempSrc: Oral Oral Oral Oral  SpO2: 100% 100% 100% 100%  Weight:      Height:       No intake or output data in the 24 hours ending 08/18/23 0714    08/17/2023    2:08 PM 06/11/2023    6:57 AM 04/30/2023   10:57 AM  Last 3 Weights  Weight (lbs) 191 lb 191 lb 12.8 oz 191 lb  Weight (kg) 86.637 kg 87 kg 86.637 kg     Body mass index is 36.09 kg/m.  General: Overweight female appearing stated age laying in bed HEENT: normal Neck: no JVD Vascular: No carotid bruits; Distal pulses 2+ bilaterally Cardiac:  normal S1, S2; RRR; no murmur.  Chest nontender to palpation Lungs:  clear to auscultation bilaterally, no wheezing, rhonchi or rales  Abd: Distended, soft, nontender. Ext: no edema Musculoskeletal:  No deformities. Skin: warm and dry  Neuro:   no focal abnormalities noted Psych:  Normal affect   EKG:  The EKG was personally reviewed and demonstrates: Paced rhythm with a rate of 60 Telemetry:  Telemetry was personally reviewed and demonstrates: Paced rhythm in the 60s  Relevant CV Studies:   Laboratory Data:  High Sensitivity Troponin:   Recent Labs  Lab 08/17/23 1517 08/17/23 1852  TROPONINIHS 15 16     Chemistry Recent Labs  Lab 08/17/23 1517  NA 138  K 4.1  CL 109  CO2 22  GLUCOSE 95  BUN 34*  CREATININE 1.40*  CALCIUM  9.1  GFRNONAA 43*  ANIONGAP 7    No results for input(s): "PROT", "ALBUMIN", "AST", "ALT", "ALKPHOS", "BILITOT" in the last 168 hours. Lipids No results for input(s): "CHOL", "TRIG", "HDL", "LABVLDL", "LDLCALC", "CHOLHDL" in the last 168 hours.  Hematology Recent Labs  Lab  08/17/23 1517  WBC 5.4  RBC 4.65  HGB 13.2  HCT 42.4  MCV 91.2  MCH 28.4  MCHC 31.1  RDW 14.6  PLT 159   Thyroid No results for input(s): "TSH", "FREET4" in the last 168 hours.  BNP Recent Labs  Lab 08/17/23 1517  BNP 166.2*    DDimer No results for input(s): "DDIMER" in the last 168 hours.   Radiology/Studies:  DG Chest 2 View Result Date: 08/17/2023 EXAM: 2 VIEW(S) XRAY OF THE CHEST 08/17/2023 02:31:35 PM COMPARISON: 2-view chest x-ray 06/11/2023. CLINICAL HISTORY: Chest pain. Per Triage: Pt reports pain around pacemaker site radiating into left arm. Was feeling some SOB when it started. Denies jaw pain, back pain. Did not get any alert from pacemaker app. FINDINGS: LUNGS AND PLEURA: Mild pulmonary vascular congestion is now present. No focal pulmonary opacity. No pleural effusion. No pneumothorax. HEART AND MEDIASTINUM: Cardiomegaly is stable. Cardiac leads are stable. BONES AND SOFT TISSUES: Degenerative changes in the thoracic spine are stable. No acute osseous abnormality. IMPRESSION: 1. Mild pulmonary vascular congestion. 2. Stable cardiomegaly. Electronically signed by: Audree Leas MD 08/17/2023 02:43 PM EDT RP Workstation: [id number]     Assessment and Plan:   [name] is a 58 y.o. 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  Chest pain Mildly elevated BNP Has had left-sided chest pain and arm pain that started yesterday at 11:30 AM while at work.  Since then has had pain come and go intermittently.  Pain is worse when laying on the left side.  Pain is described as an aching/burning pain.  Denies any electrical or shocklike pain. Pain is not exertional or pleuritic.  Denies nausea, vomiting, diaphoresis, new worsening shortness of breath.  Is euvolemic on exam. Chest x-ray found no acute cardiopulmonary changes -- Chest pain is atypical and troponins are negative so ACS is unlikely. -- Per Dr. Stann Earnest, do not feel further inpatient workup is needed; since rhythm has been fine in house he feels interrogation would low yield, anticipate continued outpatient follow-up -- Arranged outpatient follow up on 09/03/23 at 2:05 pm with Lawana Pray  Nonischemic cardiomyopathy LV systolic dysfunction Cardiac arrest status post CRT-D [facility] in 04/2022 CKD 3a On 04/2022 [facility]. EKG strips indicated it was monomorphic VT. She was started on amiodarone  and received a CRT-D. LHC at that time showed non obstructive CAD. TTE at that time showed LVEF 25-30%. Cardiac MRI showed basal anterior midwall stripe and basal inferoseptal midwall stripe which can be seen in prior myocarditis. There is base to mid inferolateral and lateral subendocardial LGE 25-50% likely from prior infarct in left circumflex territory. On 12/2022 the patient presented with chest pain. She underwent a cardiac catheterization that showed 40% stenosis in the proximal RCA, and 40% stenosis in the mid LCX. - Continue amiodarone  200 mg daily. Ordered TSH and LFT for completeness - Continue aspirin  81 mg daily  GDMT - Consider increase bisoprolol  to 10mg  as blood pressures are elevated. - Restart Farxiga  10 mg daily as patient was on this prior to AKI - Continue Imdur  60mg  daily - Per chart review, Cr has been extremely variable. Normal in 03/2023 which was outlier, last 1.7 in 05/2023. - Will await Dr. Stann Earnest advisement on GDMT resumption. Some concern for medication adherence as listed in [facility] - patient reported being unsure if she was taking several of the medicines listed (atorvastatin , Zetia , Farxiga , Imdur  for example)  Nonobstructive CAD Hyperlipidemia Continue  Lipitor 80 mg daily Restart home Zetia  10 mg daily  Type 2 diabetes - Restart Farxiga  10 mg patient was on this prior to AKI  Hypertension Blood pressures have been elevated will plan to start some GDMT as above.  Alcohol use Cannabis use Recommend cessation.  Management per primary  -- Arranged outpatient follow up on 09/03/23 at 2:05 pm with Lawana Pray  Risk Assessment/Risk Scores:            -- Arranged outpatient follow up on 09/03/23 at 2:05 pm with Lawana Pray  For questions or updates, please contact [facility] Please consult [email or web address] for contact info under    Signed, Melita Springer, PA-C  08/18/2023 7:14 AM

## 2023-08-18 NOTE — Hospital Course (Addendum)
 Brief Narrative:  58 year old with history of DM2, HTN, CKD 3A, nonobstructive CAD, combined CHF 45%, history of VT arrest status post ICD placement admitted to the hospital for chest pain.  Chest x-ray showed pulmonary vascular congestion with mild cardiomegaly.  Admitting provider consulted cardiology.  Patient's D-dimer was negative.  Troponins are essentially flat.  EKG shows old infarct, prolonged QTc but no acute ST-T changes.  Due to prior history patient admitted to the hospital.  Cardiology team consulted.  Echocardiogram January 2025 showed EF 45%, grade 1 DD, small pericardial effusion.  Eventually cardiology cleared patient for discharge with outpatient arrangements made by their service.  Renal function is also slightly better today.  She has been advised to get outpatient repeat blood work done.   Assessment & Plan:  Principal Problem:   Chest pain Active Problems:   HYPERTENSION   Chronic combined systolic and diastolic congestive heart failure (HCC)   Stage 3a chronic kidney disease (HCC)   Type 2 diabetes mellitus with complication, without long-term current use of insulin (HCC)   Mild intermittent asthma without complication     Chest pain, resolved History of CAD Essential hypertension -Troponins are essentially flat.  EKG shows old infarct, prolonged QTc but no acute ST-T changes.  Due to prior history patient admitted to the hospital.  Cardiology team consulted.  Echocardiogram January 2025 showed EF 45%, grade 1 DD, small pericardial effusion - Continue aspirin  and statin-D-dimer is negative   Chronic combined systolic & diastolic CHF  -Echocardiogram as mentioned above with EF of 45%.  Follows outpatient [facility] cardiology.  Their service has been consulted. - On bisoprolol , Imdur    AKI on CKD 3A  -Admission creatinine 1.4, today slightly better.  Advised to repeat outpatient with PCP next 1 week   Type II DM  -A1c 6.0.  Sliding scale and Accu-Cheks   Hx of VT  -  S/p ICD placement, on amiodarone     Asthma  As needed bronchodilators    DVT prophylaxis: enoxaparin  (LOVENOX ) injection 40 mg Start: 08/17/23 2200      Code Status: Full Code Family Communication: Signother at bedside Discharge today    Subjective: No chest pain no other complaints   Examination:  General exam: Appears calm and comfortable  Respiratory system: Clear to auscultation. Respiratory effort normal. Cardiovascular system: S1 & S2 heard, RRR. No JVD, murmurs, rubs, gallops or clicks. No pedal edema. Gastrointestinal system: Abdomen is nondistended, soft and nontender. No organomegaly or masses felt. Normal bowel sounds heard. Central nervous system: Alert and oriented. No focal neurological deficits. Extremities: Symmetric 5 x 5 power. Skin: No rashes, lesions or ulcers Psychiatry: Judgement and insight appear normal. Mood & affect appropriate.

## 2023-08-31 NOTE — Progress Notes (Unsigned)
 Cardiology Clinic Note   Patient Name: [name] Date of Encounter: 08/31/2023  Primary Care Provider:  Harlan Liberty, FNP Primary Cardiologist:  Luana Rumple, MD  Patient Profile    ***  Past Medical History    Past Medical History:  Diagnosis Date   Abnormal TSH    10/2011 - additional hormones pending   Anxiety    Asthma    Atypical chest pain    recent low risk myoview    Coronary artery spasm (HCC)    a. NSTEMI 10/2011 with no CAD by cath but evidence of spasm when injecting left coronary artery.    Hyperlipidemia    Hypertension    LBBB (left bundle branch block)    Noncompliance    SVT (supraventricular tachycardia) (HCC) 10/22/10   wide complex tachycardia of same QRS as intinsic LBBB   Past Surgical History:  Procedure Laterality Date   LEFT HEART CATHETERIZATION WITH CORONARY ANGIOGRAM N/A 11/20/2011   Procedure: LEFT HEART CATHETERIZATION WITH CORONARY ANGIOGRAM;  Surgeon: Arnoldo Lapping, MD;  Location: [facility] CATH LAB;  Service: Cardiovascular;  Laterality: N/A;   RIGHT/LEFT HEART CATH AND CORONARY ANGIOGRAPHY N/A 01/08/2023   Procedure: RIGHT/LEFT HEART CATH AND CORONARY ANGIOGRAPHY;  Surgeon: Kyra Phy, MD;  Location: [facility] INVASIVE CV LAB;  Service: Cardiovascular;  Laterality: N/A;   TUBAL LIGATION      Allergies  Allergies  Allergen Reactions   Celebrex  [Celecoxib ] Rash   Amoxicillin Hives   Penicillins Hives and Itching   Shellfish Allergy Other (See Comments)    "It makes my tongue swell and itch but I eat it anyway". She usually takes a Benadryl before eating it.   Shellfish-Derived Products Other (See Comments)    "It makes my tongue swell and itch but I eat it anyway". She usually takes a Benadryl before eating it.    History of Present Illness    ***  Home Medications    Prior to Admission medications   Medication Sig Start Date End Date Taking? Authorizing Provider  albuterol  (PROVENTIL  HFA;VENTOLIN  HFA) 108 (90 BASE) MCG/ACT  inhaler Inhale 2 puffs into the lungs every 6 (six) hours as needed for wheezing or shortness of breath. For wheezing    [provider]  amiodarone  (PACERONE ) 200 MG tablet Take 1 tablet (200 mg total) by mouth daily. Patient taking differently: Take 100 mg by mouth daily. 09/10/22   Croitoru, Mihai, MD  aspirin  EC 81 MG tablet Take 81 mg by mouth daily. 04/22/22   [provider]  atorvastatin  (LIPITOR) 80 MG tablet Take 80 mg by mouth at bedtime. Patient not taking: Reported on 08/18/2023    [provider]  bisoprolol  (ZEBETA ) 5 MG tablet Take 1 tablet (5 mg total) by mouth daily. 04/30/23   Croitoru, Mihai, MD  chlorpheniramine-HYDROcodone  (TUSSIONEX) 10-8 MG/5ML Take 5 mLs by mouth at bedtime as needed for cough. Patient not taking: Reported on 08/18/2023 06/11/23   Prosperi, Christian H, PA-C  cyclobenzaprine  (FLEXERIL ) 10 MG tablet Take 0.5-1 tablets (5-10 mg total) by mouth 2 (two) times daily as needed for muscle spasms. Patient not taking: Reported on 08/18/2023 04/08/23   Harris, Abigail, PA-C  dapagliflozin  propanediol (FARXIGA ) 10 MG TABS tablet Take 1 tablet (10 mg total) by mouth daily before breakfast. 01/09/23   Sheikh, Omair Latif, DO  ezetimibe  (ZETIA ) 10 MG tablet Take 1 tablet (10 mg total) by mouth daily. 01/10/23   Aura Leeds Latif, DO  ferrous sulfate  325 (65 FE) MG  tablet Take 1 tablet (325 mg total) by mouth every morning. Patient taking differently: Take 325 mg by mouth daily. 01/10/23   Sheikh, Jonel Nephew Latif, DO  gabapentin  (NEURONTIN ) 400 MG capsule Take 1 capsule by mouth 2 (two) times daily. 04/22/23   [provider]  isosorbide  mononitrate (IMDUR ) 60 MG 24 hr tablet Take 1 tablet (60 mg total) by mouth daily. 01/09/23   Sheikh, Omair Latif, DO  nitroGLYCERIN  (NITROSTAT ) 0.4 MG SL tablet Place 0.4 mg under the tongue every 5 (five) minutes as needed for chest pain.    [provider]  ondansetron  (ZOFRAN -ODT) 4 MG disintegrating  tablet Take 1 tablet (4 mg total) by mouth every 6 (six) hours as needed for nausea or vomiting. Patient not taking: Reported on 08/18/2023 06/11/23   Prosperi, Christian H, PA-C  sacubitril -valsartan  (ENTRESTO ) 97-103 MG Take 1 tablet by mouth 2 (two) times daily. 01/19/23   Croitoru, Mihai, MD  solifenacin (VESICARE) 5 MG tablet Take 1 tablet by mouth daily. 12/23/22   [provider]    Family History    Family History  Problem Relation Age of Onset   Stroke Mother    She indicated that the status of her mother is unknown.  Social History    Social History   Socioeconomic History   Marital status: Single    Spouse name: Not on file   Number of children: 3   Years of education: Not on file   Highest education level: Not on file  Occupational History    Comment: Unemployed  Tobacco Use   Smoking status: Every Day   Smokeless tobacco: Never   Tobacco comments:    04/30/2023 Patient still smokes daily    Patient smokes 1 blunt daily  Substance and Sexual Activity   Alcohol use: Yes    Alcohol/week: 5.0 standard drinks of alcohol    Types: 5 Cans of beer per week    Comment: socially on weekends   Drug use: Yes    Types: Marijuana   Sexual activity: Yes    Birth control/protection: None  Other Topics Concern   Not on file  Social History Narrative   Lives in [place], unemployed.  Single.  3 children ages 53,24,28.         Social Drivers of Corporate investment banker Strain: Not on File (11/18/2021)   Received from [employer]    Financial Resource Strain: 0  Food Insecurity: No Food Insecurity (08/17/2023)   Hunger Vital Sign    Worried About Running Out of Food in the Last Year: Never true    Ran Out of Food in the Last Year: Never true  Transportation Needs: No Transportation Needs (08/17/2023)   PRAPARE - Administrator, Civil Service (Medical): No    Lack of Transportation (Non-Medical): No  Physical Activity: Not  on File (11/18/2021)   Received from [facility]   Physical Activity    Physical Activity: 0  Recent Concern: Physical Activity - At Risk (11/18/2021)   Received from [place], [place]   Physical Activity    Physical Activity: 2  Stress: Not on File (11/18/2021)   Received from [facility]   Stress    Stress: 0  Social Connections: Not on File (11/18/2021)   Received from [facility]   Social Connections    Connectedness: 0  Intimate Partner Violence: Not At Risk (08/17/2023)   Humiliation, Afraid, Rape, and Kick questionnaire    Fear of Current or  Ex-Partner: No    Emotionally Abused: No    Physically Abused: No    Sexually Abused: No     Review of Systems    General:  No chills, fever, night sweats or weight changes.  Cardiovascular:  No chest pain, dyspnea on exertion, edema, orthopnea, palpitations, paroxysmal nocturnal dyspnea. Dermatological: No rash, lesions/masses Respiratory: No cough, dyspnea Urologic: No hematuria, dysuria Abdominal:   No nausea, vomiting, diarrhea, bright red blood per rectum, melena, or hematemesis Neurologic:  No visual changes, wkns, changes in mental status. All other systems reviewed and are otherwise negative except as noted above.  Physical Exam    VS:  There were no vitals taken for this visit. , BMI There is no height or weight on file to calculate BMI. GEN: Well nourished, well developed, in no acute distress. HEENT: normal. Neck: Supple, no JVD, carotid bruits, or masses. Cardiac: RRR, no murmurs, rubs, or gallops. No clubbing, cyanosis, edema.  Radials/DP/PT 2+ and equal bilaterally.  Respiratory:  Respirations regular and unlabored, clear to auscultation bilaterally. GI: Soft, nontender, nondistended, BS + x 4. MS: no deformity or atrophy. Skin: warm and dry, no rash. Neuro:  Strength and sensation are intact. Psych: Normal affect.  Accessory Clinical Findings    Recent Labs: 01/08/2023: Magnesium 2.4 08/17/2023: B Natriuretic Peptide 166.2;  Hemoglobin 13.2; Platelets 159 08/18/2023: ALT 13; BUN 32; Creatinine, Ser 1.32; Potassium 3.9; Sodium 138; TSH 3.625   Recent Lipid Panel    Component Value Date/Time   CHOL 267 (H) 01/08/2023 0028   TRIG 199 (H) 01/08/2023 0028   HDL 66 01/08/2023 0028   CHOLHDL 4.0 01/08/2023 0028   VLDL 40 01/08/2023 0028   LDLCALC 161 (H) 01/08/2023 0028   LDLDIRECT 134 (H) 10/23/2009 2147    No BP recorded.  {Refresh Note OR Click here to enter BP  :1}***    ECG personally reviewed by me today- ***          Assessment & Plan   1.  ***   Chet Cota. Azilee Pirro NP-C     08/31/2023, 12:59 PM [facility] [address] Office [phone] Fax [phone]    I spent***minutes examining this patient, reviewing medications, and using patient centered shared decision making involving their cardiac care.   I spent  20 minutes reviewing past medical history,  medications, and prior cardiac tests.

## 2023-09-03 ENCOUNTER — Encounter: Payer: Self-pay | Admitting: General Practice

## 2023-09-03 ENCOUNTER — Ambulatory Visit: Attending: General Practice | Admitting: General Practice

## 2023-09-03 VITALS — BP 120/89 | HR 60 | Wt 198.4 lb

## 2023-09-03 DIAGNOSIS — I428 Other cardiomyopathies: Secondary | ICD-10-CM | POA: Insufficient documentation

## 2023-09-03 DIAGNOSIS — I471 Supraventricular tachycardia, unspecified: Secondary | ICD-10-CM | POA: Insufficient documentation

## 2023-09-03 DIAGNOSIS — I25111 Atherosclerotic heart disease of native coronary artery with angina pectoris with documented spasm: Secondary | ICD-10-CM | POA: Insufficient documentation

## 2023-09-03 DIAGNOSIS — I10 Essential (primary) hypertension: Secondary | ICD-10-CM | POA: Diagnosis present

## 2023-09-03 DIAGNOSIS — R0789 Other chest pain: Secondary | ICD-10-CM | POA: Insufficient documentation

## 2023-09-03 DIAGNOSIS — E7800 Pure hypercholesterolemia, unspecified: Secondary | ICD-10-CM | POA: Diagnosis present

## 2023-09-03 MED ORDER — DAPAGLIFLOZIN PROPANEDIOL 10 MG PO TABS: 10.0000 mg | ORAL_TABLET | Freq: Every day | ORAL | 3 refills | Status: DC

## 2023-09-03 NOTE — Patient Instructions (Signed)
 Medication Instructions:  Begin Farxiga  10mg . Take this medication once daily. *If you need a refill on your cardiac medications before your next appointment, please call your pharmacy*   Lab Work: IN ONE WEEK return for fasting labs................ BMET, LIPIDS, LFT's If you have labs (blood work) drawn today and your tests are completely normal, you will receive your results only by: MyChart Message (if you have MyChart) OR A paper copy in the mail If you have any lab test that is abnormal or we need to change your treatment, we will call you to review the results.   Testing/Procedures: No procedures were ordered during today's visit.    Follow-Up: At [facility], you and your health needs are our priority.  As part of our continuing mission to provide you with exceptional heart care, we have created designated Provider Care Teams.  These Care Teams include your primary Cardiologist (physician) and Advanced Practice Providers (APPs -  Physician Assistants and Nurse Practitioners) who all work together to provide you with the care you need, when you need it.  We recommend signing up for the patient portal called MyChart.  Sign up information is provided on this After Visit Summary.  MyChart is used to connect with patients for Virtual Visits (Telemedicine).  Patients are able to view lab/test results, encounter notes, upcoming appointments, etc.  Non-urgent messages can be sent to your provider as well.   To learn more about what you can do with MyChart, go to [email or web address].    Your next appointment:   3 month(s)  Provider:   Lawana Pray, NP          Other Instructions Thank you for choosing [facility]!

## 2023-09-11 ENCOUNTER — Ambulatory Visit (INDEPENDENT_AMBULATORY_CARE_PROVIDER_SITE_OTHER): Payer: No Typology Code available for payment source

## 2023-09-11 DIAGNOSIS — I428 Other cardiomyopathies: Secondary | ICD-10-CM

## 2023-09-11 LAB — CUP PACEART REMOTE DEVICE CHECK
Battery Remaining Longevity: 78 mo
Battery Remaining Percentage: 82 %
Battery Voltage: 2.99 V
Brady Statistic AP VP Percent: 40 %
Brady Statistic AP VS Percent: 1 %
Brady Statistic AS VP Percent: 58 %
Brady Statistic AS VS Percent: 1 %
Brady Statistic RA Percent Paced: 40 %
Date Time Interrogation Session: 20250620020212
HighPow Impedance: 68 Ohm
Implantable Lead Connection Status: 753985
Implantable Lead Connection Status: 753985
Implantable Lead Implant Date: 20240229
Implantable Lead Implant Date: 20240229
Implantable Lead Location: 753858
Implantable Lead Location: 753860
Implantable Pulse Generator Implant Date: 20240229
Lead Channel Impedance Value: 1300 Ohm
Lead Channel Impedance Value: 390 Ohm
Lead Channel Impedance Value: 480 Ohm
Lead Channel Pacing Threshold Amplitude: 1 V
Lead Channel Pacing Threshold Amplitude: 1 V
Lead Channel Pacing Threshold Amplitude: 1.5 V
Lead Channel Pacing Threshold Pulse Width: 0.5 ms
Lead Channel Pacing Threshold Pulse Width: 0.5 ms
Lead Channel Pacing Threshold Pulse Width: 0.5 ms
Lead Channel Sensing Intrinsic Amplitude: 12 mV
Lead Channel Sensing Intrinsic Amplitude: 5 mV
Lead Channel Setting Pacing Amplitude: 2 V
Lead Channel Setting Pacing Amplitude: 2 V
Lead Channel Setting Pacing Amplitude: 2.5 V
Lead Channel Setting Pacing Pulse Width: 0.5 ms
Lead Channel Setting Pacing Pulse Width: 0.5 ms
Lead Channel Setting Sensing Sensitivity: 0.5 mV
Pulse Gen Serial Number: 211015531
Zone Setting Status: 755011

## 2023-09-17 ENCOUNTER — Ambulatory Visit: Payer: Self-pay | Admitting: Cardiovascular Disease

## 2023-11-09 NOTE — Progress Notes (Signed)
 Remote ICD transmission.

## 2023-11-24 ENCOUNTER — Telehealth: Payer: Self-pay | Admitting: Cardiovascular Disease

## 2023-11-24 NOTE — Telephone Encounter (Signed)
 Paper Work Dropped Off: Dentistry paperwork to be completed before oral surgery  Date: 11-24-23  Location of paper:  Dr. Fanny mailbox

## 2023-11-25 NOTE — Telephone Encounter (Signed)
 Fax sent- Cardiology orders for surgical extractions/clearance

## 2023-11-25 NOTE — Telephone Encounter (Signed)
 Called to let the patient know that the completed paperwork has been faxed to [facility]. She reports that she needs a copy because she needs to send it to the oral surgeon as well? Informed her that it has been sent to medical records. Recommended her call [facility] to see if they can fax it to where it is needed. She verbalized understanding. I apologized for not keeping a copy.

## 2023-12-01 ENCOUNTER — Emergency Department (HOSPITAL_COMMUNITY)

## 2023-12-01 ENCOUNTER — Encounter (HOSPITAL_COMMUNITY): Payer: Self-pay

## 2023-12-01 ENCOUNTER — Emergency Department (HOSPITAL_COMMUNITY)
Admission: EM | Admit: 2023-12-01 | Discharge: 2023-12-01 | Disposition: A | Discharge status: Home / Self Care | Attending: Emergency Medicine | Admitting: Emergency Medicine

## 2023-12-01 ENCOUNTER — Other Ambulatory Visit: Payer: Self-pay

## 2023-12-01 DIAGNOSIS — N183 Chronic kidney disease, stage 3 unspecified: Secondary | ICD-10-CM | POA: Insufficient documentation

## 2023-12-01 DIAGNOSIS — Z7982 Long term (current) use of aspirin: Secondary | ICD-10-CM | POA: Insufficient documentation

## 2023-12-01 DIAGNOSIS — R079 Chest pain, unspecified: Secondary | ICD-10-CM | POA: Diagnosis present

## 2023-12-01 DIAGNOSIS — I130 Hypertensive heart and chronic kidney disease with heart failure and stage 1 through stage 4 chronic kidney disease, or unspecified chronic kidney disease: Secondary | ICD-10-CM | POA: Diagnosis not present

## 2023-12-01 DIAGNOSIS — I2510 Atherosclerotic heart disease of native coronary artery without angina pectoris: Secondary | ICD-10-CM | POA: Diagnosis not present

## 2023-12-01 DIAGNOSIS — E1122 Type 2 diabetes mellitus with diabetic chronic kidney disease: Secondary | ICD-10-CM | POA: Diagnosis not present

## 2023-12-01 DIAGNOSIS — I509 Heart failure, unspecified: Secondary | ICD-10-CM | POA: Diagnosis not present

## 2023-12-01 DIAGNOSIS — Z79899 Other long term (current) drug therapy: Secondary | ICD-10-CM | POA: Diagnosis not present

## 2023-12-01 LAB — CBC
HCT: 47.8 % — ABNORMAL HIGH (ref 36.0–46.0)
Hemoglobin: 14.2 g/dL (ref 12.0–15.0)
MCH: 27.7 pg (ref 26.0–34.0)
MCHC: 29.7 g/dL — ABNORMAL LOW (ref 30.0–36.0)
MCV: 93.4 fL (ref 80.0–100.0)
Platelets: 180 K/uL (ref 150–400)
RBC: 5.12 MIL/uL — ABNORMAL HIGH (ref 3.87–5.11)
RDW: 13.8 % (ref 11.5–15.5)
WBC: 6.4 K/uL (ref 4.0–10.5)
nRBC: 0 % (ref 0.0–0.2)

## 2023-12-01 LAB — BASIC METABOLIC PANEL WITH GFR
Anion gap: 11 (ref 5–15)
BUN: 29 mg/dL — ABNORMAL HIGH (ref 6–20)
CO2: 21 mmol/L — ABNORMAL LOW (ref 22–32)
Calcium: 9.3 mg/dL (ref 8.9–10.3)
Chloride: 108 mmol/L (ref 98–111)
Creatinine, Ser: 1.16 mg/dL — ABNORMAL HIGH (ref 0.44–1.00)
GFR, Estimated: 54 mL/min — ABNORMAL LOW (ref >60)
Glucose, Bld: 111 mg/dL — ABNORMAL HIGH (ref 70–99)
Potassium: 4 mmol/L (ref 3.5–5.1)
Sodium: 140 mmol/L (ref 135–145)

## 2023-12-01 LAB — TROPONIN T, HIGH SENSITIVITY
Troponin T High Sensitivity: <15 ng/L (ref 0–19)
Troponin T High Sensitivity: <15 ng/L (ref 0–19)

## 2023-12-01 MED ORDER — NITROGLYCERIN 0.4 MG SL SUBL
0.4000 mg | SUBLINGUAL_TABLET | SUBLINGUAL | Status: DC | PRN
  Administered 2023-12-01: 0.4 mg via SUBLINGUAL
  Filled 2023-12-01: qty 1

## 2023-12-01 MED ORDER — ASPIRIN 81 MG PO CHEW
324.0000 mg | CHEWABLE_TABLET | Freq: Once | ORAL | Status: AC
  Administered 2023-12-01: 324 mg via ORAL
  Filled 2023-12-01: qty 4

## 2023-12-01 NOTE — ED Provider Notes (Signed)
 [place] EMERGENCY DEPARTMENT AT [facility] Provider Note   CSN: [id number] Arrival date & time: 12/01/23  9358     Patient presents with: Chest Pain  HPI [name] is a 58 y.o. female with CAD, CKD stage 3, HTN, non-ischemic cardiomyopathy, indwelling ICD, CHF, type II DM presenting for chest pain.  Started this morning and woke her from sleep.  She states that in center of her chest and did not radiate to her back with shortness of breath.  The pain lasted for about 5 minutes and then resolved.  She is asymptomatic at this time.  She states the pain was worse when she was laying on her side but improved with standing up.  It is not exertional.  Also mentioned that she did not take her blood pressure medicine this morning because of the pain.    Chest Pain      Prior to Admission medications   Medication Sig Start Date End Date Taking? Authorizing Provider  albuterol  (PROVENTIL  HFA;VENTOLIN  HFA) 108 (90 BASE) MCG/ACT inhaler Inhale 2 puffs into the lungs every 6 (six) hours as needed for wheezing or shortness of breath. For wheezing    [provider]  amiodarone  (PACERONE ) 200 MG tablet Take 1 tablet (200 mg total) by mouth daily. Patient taking differently: Take 100 mg by mouth daily. 09/10/22   Croitoru, Mihai, MD  aspirin  EC 81 MG tablet Take 81 mg by mouth daily. 04/22/22   [provider]  atorvastatin  (LIPITOR) 80 MG tablet Take 80 mg by mouth at bedtime. Patient not taking: Reported on 08/18/2023    [provider]  bisoprolol  (ZEBETA ) 5 MG tablet Take 1 tablet (5 mg total) by mouth daily. 04/30/23   Croitoru, Mihai, MD  chlorpheniramine-HYDROcodone  (TUSSIONEX) 10-8 MG/5ML Take 5 mLs by mouth at bedtime as needed for cough. Patient not taking: Reported on 08/18/2023 06/11/23   Prosperi, Christian H, PA-C  cyclobenzaprine  (FLEXERIL ) 10 MG tablet Take 0.5-1 tablets (5-10 mg total) by mouth 2 (two) times daily as needed for muscle  spasms. Patient not taking: Reported on 08/18/2023 04/08/23   Harris, Abigail, PA-C  dapagliflozin  propanediol (FARXIGA ) 10 MG TABS tablet Take 1 tablet (10 mg total) by mouth daily before breakfast. 01/09/23   Sheikh, Omair Latif, DO  dapagliflozin  propanediol (FARXIGA ) 10 MG TABS tablet Take 1 tablet (10 mg total) by mouth daily before breakfast. 09/03/23   Cleaver, Josefa HERO, NP  ezetimibe  (ZETIA ) 10 MG tablet Take 1 tablet (10 mg total) by mouth daily. 01/10/23   Sheikh, Omair Latif, DO  ferrous sulfate  325 (65 FE) MG tablet Take 1 tablet (325 mg total) by mouth every morning. Patient taking differently: Take 325 mg by mouth daily. 01/10/23   Sherrill Cable Latif, DO  gabapentin  (NEURONTIN ) 400 MG capsule Take 1 capsule by mouth 2 (two) times daily. 04/22/23   [provider]  isosorbide  mononitrate (IMDUR ) 60 MG 24 hr tablet Take 1 tablet (60 mg total) by mouth daily. 01/09/23   Sheikh, Cable Latif, DO  nitroGLYCERIN  (NITROSTAT ) 0.4 MG SL tablet Place 0.4 mg under the tongue every 5 (five) minutes as needed for chest pain.    [provider]  ondansetron  (ZOFRAN -ODT) 4 MG disintegrating tablet Take 1 tablet (4 mg total) by mouth every 6 (six) hours as needed for nausea or vomiting. Patient not taking: Reported on 08/18/2023 06/11/23   Prosperi, Christian H, PA-C  sacubitril -valsartan  (ENTRESTO ) 97-103 MG Take 1 tablet by mouth 2 (two) times  daily. 01/19/23   Croitoru, Mihai, MD  solifenacin (VESICARE) 5 MG tablet Take 1 tablet by mouth daily. 12/23/22   [provider]    Allergies: Celebrex  [celecoxib ], Amoxicillin, Penicillins, Shellfish allergy, and Shellfish-derived products    Review of Systems  Cardiovascular:  Positive for chest pain.    Updated Vital Signs BP (!) 147/94   Pulse 63   Temp 97.6 F (36.4 C) (Oral)   Resp 16   SpO2 100%   Physical Exam Vitals and nursing note reviewed.  HENT:     Head: Normocephalic and atraumatic.     Mouth/Throat:      Mouth: Mucous membranes are moist.  Eyes:     General:        Right eye: No discharge.        Left eye: No discharge.     Conjunctiva/sclera: Conjunctivae normal.  Cardiovascular:     Rate and Rhythm: Normal rate and regular rhythm.     Pulses: Normal pulses.     Heart sounds: Normal heart sounds.  Pulmonary:     Effort: Pulmonary effort is normal.     Breath sounds: Normal breath sounds.  Abdominal:     General: Abdomen is flat.     Palpations: Abdomen is soft.  Skin:    General: Skin is warm and dry.  Neurological:     General: No focal deficit present.  Psychiatric:        Mood and Affect: Mood normal.     (all labs ordered are listed, but only abnormal results are displayed) Labs Reviewed  CBC - Abnormal; Notable for the following components:      Result Value   RBC 5.12 (*)    HCT 47.8 (*)    MCHC 29.7 (*)    All other components within normal limits  BASIC METABOLIC PANEL WITH GFR - Abnormal; Notable for the following components:   CO2 21 (*)    Glucose, Bld 111 (*)    BUN 29 (*)    Creatinine, Ser 1.16 (*)    GFR, Estimated 54 (*)    All other components within normal limits  TROPONIN T, HIGH SENSITIVITY  TROPONIN T, HIGH SENSITIVITY    EKG: EKG Interpretation Date/Time:  Tuesday December 01 2023 06:50:38 EDT Ventricular Rate:  63 PR Interval:  184 QRS Duration:  149 QT Interval:  473 QTC Calculation: 485 R Axis:   83  Text Interpretation: AV dual-paced rhythm Confirmed by Levander Houston [id number]) on 12/01/2023 7:13:27 AM  Radiology: [facility] Chest 2 View Result Date: 12/01/2023 EXAM: 2 VIEW(S) XRAY OF THE CHEST 12/01/2023 07:27:00 AM COMPARISON: 08/17/2023 CLINICAL HISTORY: Chest Pain. Per chart - PT states that she has been having intermittent chest pain x 1 day. PT states that she has been having a dull aching pain lasting about 3-5 min. FINDINGS: LUNGS AND PLEURA: No focal pulmonary opacity. No pulmonary edema. No pleural effusion. No pneumothorax. HEART AND  MEDIASTINUM: Cardiomegaly, stable. Stable left subclavian AICD. BONES AND SOFT TISSUES: Thoracic degenerative changes. No acute osseous abnormality. IMPRESSION: 1. No acute findings. 2. Stable left subclavian AICD and cardiomegaly. Electronically signed by: Waddell Calk MD 12/01/2023 07:39 AM EDT RP Workstation: [id number]     Procedures   Medications Ordered in the ED  nitroGLYCERIN  (NITROSTAT ) SL tablet 0.4 mg (0.4 mg Sublingual Given 12/01/23 1230)  aspirin  chewable tablet 324 mg (324 mg Oral Given 12/01/23 1132)    Clinical Course as of 12/01/23 1408  Tue Dec 01, 2023  1121 EKG 12-Lead [JR]  1327 Discussed patient with Dr. Lonni of cardiology who reviewed her presentation and her chart and knows her from clinic advised that she has been having intermittent chest pain but had a reassuring cath within the last year and feels that overall workup is reassuring here and his second troponin remains flat and normal she can be discharged as she does have close follow-up with [facility]. [JR]    Clinical Course User Index [JR] Lang Norleen POUR, PA-C                                 Medical Decision Making Amount and/or Complexity of Data Reviewed ECG/medicine tests:  Decision-making details documented in ED Course.  Risk OTC drugs. Prescription drug management.   Initial Impression and Ddx 58 year old well-appearing female presenting for chest pain.   Exam unremarkable.  Initially chest pain-free.  DDx includes ACS, aortic dissection, PE, pneumonia, pneumothorax, CHF, arrhythmia, pancreatitis, other. Patient PMH that increases complexity of ED encounter:  CAD, CKD stage 3, HTN, non-ischemic cardiomyopathy, indwelling ICD, CHF, type II DM   Interpretation of Diagnostics - I independent reviewed and interpreted the labs as followed: Nonacute, negative troponin x2  - I independently visualized the following imaging with scope of interpretation limited to determining acute life  threatening conditions related to emergency care: CXR, which revealed no acute findings  - I personally reviewed and interpreted EKG which revealed AV dual paced  Patient Reassessment and Ultimate Disposition/Management Chest pain improved after nitro.  Did discuss patient with Dr. Lonni who is familiar with patient. Advised that she has been having intermittent chest pain but had a reassuring cath within the last year and feels that overall workup is reassuring here and his second troponin remains flat and normal she can be discharged as she does have close follow-up with [facility].  Second troponin negative.  Advised to follow-up with cardiology.  Discussed return precautions.  Discharged.  Considered dissection but unlikely given negative troponins, reassuring EKG, normal x-ray and improved chest pain no longer radiate to the back.  Patient management required discussion with the following services or consulting groups:  Cardiology  Complexity of Problems Addressed Acute complicated illness or Injury  Additional Data Reviewed and Analyzed Further history obtained from: Past medical history and medications listed in the EMR and Prior ED visit notes  Patient Encounter Risk Assessment Consideration of hospitalization      Final diagnoses:  Chest pain, unspecified type    ED Discharge Orders          Ordered    Ambulatory referral to Cardiology       Comments: If you have not heard from the Cardiology office within the next 72 hours please call [phone].   12/01/23 1408               Lang Norleen POUR, PA-C 12/01/23 1409    Levander Houston, MD 12/02/23 [phone]

## 2023-12-01 NOTE — Discharge Instructions (Addendum)
 Evaluation today was overall reassuring.  Please follow-up with your cardiologist.  If your symptoms worsen, please return to the ED for further evaluation.

## 2023-12-01 NOTE — ED Triage Notes (Signed)
 PT states that she has been having intermittent chest pain x 1 day. PT states that she has been having a dull aching pain lasting about 3-5 min.

## 2023-12-07 NOTE — Progress Notes (Deleted)
 Cardiology Clinic Note   Patient Name: [name] Date of Encounter: 12/07/2023  Primary Care Provider:  Geneva Maffucci, FNP Primary Cardiologist:  Jerel Balding, MD  Patient Profile    [name] 58 year old female presents to the clinic today for follow-up evaluation of her chronic combined systolic and diastolic CHF, chest pain, and and hypertension.  Past Medical History    Past Medical History:  Diagnosis Date   Abnormal TSH    10/2011 - additional hormones pending   Anxiety    Asthma    Atypical chest pain    recent low risk myoview    Coronary artery spasm (HCC)    a. NSTEMI 10/2011 with no CAD by cath but evidence of spasm when injecting left coronary artery.    Hyperlipidemia    Hypertension    LBBB (left bundle branch block)    Noncompliance    SVT (supraventricular tachycardia) (HCC) 10/22/10   wide complex tachycardia of same QRS as intinsic LBBB   Past Surgical History:  Procedure Laterality Date   LEFT HEART CATHETERIZATION WITH CORONARY ANGIOGRAM N/A 11/20/2011   Procedure: LEFT HEART CATHETERIZATION WITH CORONARY ANGIOGRAM;  Surgeon: Ozell Fell, MD;  Location: [facility] CATH LAB;  Service: Cardiovascular;  Laterality: N/A;   RIGHT/LEFT HEART CATH AND CORONARY ANGIOGRAPHY N/A 01/08/2023   Procedure: RIGHT/LEFT HEART CATH AND CORONARY ANGIOGRAPHY;  Surgeon: Wendel Lurena POUR, MD;  Location: [facility] INVASIVE CV LAB;  Service: Cardiovascular;  Laterality: N/A;   TUBAL LIGATION      Allergies  Allergies  Allergen Reactions   Celebrex  [Celecoxib ] Rash   Amoxicillin Hives   Penicillins Hives and Itching   Shellfish Allergy Other (See Comments)    It makes my tongue swell and itch but I eat it anyway. She usually takes a Benadryl before eating it.   Shellfish-Derived Products Other (See Comments)    It makes my tongue swell and itch but I eat it anyway. She usually takes a Benadryl before eating it.    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  Her PMH also includes NSTEMI in 2013 due to vasospasm and longstanding NICM.  Cardiac MRI 2024 showed LV systolic dysfunction of 20-25%.  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  She presents to the hospital on 08/17/2023 and was discharged on 08/18/2023.  She presented with chest pain.  Chest x-ray showed pulmonary vascular congestion with cardiomegaly.  Cardiology was consulted.  Patient's D-dimer was negative.  High-sensitivity troponins were low and flat.  Her EKG showed old infarct with prolonged QTc and no acute ST changes.  Due to her prior history she was admitted to the hospital.  Her echocardiogram 1/25 showed an EF of 45%, G1 DD, and small pericardial effusion.  On cardiology exams she was noted to have pain with palpitation.  PPM noted on her left clavicle.  Her cardiac catheterization 10/24 showed no obstructive CAD.  Her BNP was noted to be 166.  She was felt to have atypical noncardiac pain.  Outpatient cardiology follow-up was recommended.  She presented to the clinic 09/03/23 for follow-up evaluation and stated she had not been exercising formally but had been working 2 jobs.  We reviewed her recent hospitalization.  She and her husband expressed understanding.  Her blood pressure was well-controlled  at 120/90.  She reported that she was previously on Farxiga  but did not know why the medication was stopped.  Her last lipid panel was in October.  Her LDL cholesterol at that time was noted to be 161.  We reviewed this.  I will restarted Farxiga , repeated BMP, fasting lipids and LFTs, give the low-sodium diet information and planned follow-up in 3 months.  She was seen in the emergency department on 12/01/2023 with chest pain.  Her EKG showed AV paced rhythm.  She described pain in the center of her  chest that did not radiate.  She noted shortness of breath.  She reported that her pain lasted for 5 minutes and then resolved.  She was asymptomatic at the time of evaluation.  She reported that her pain was worse with laying on her side but improved with standing up.  Pain was not exertional.  She had not taken her blood pressure medication.  Case was discussed with Dr. Lonni.  Her cardiac troponins were negative x 2.  Chest x-ray showed no acute findings.  She presents to the clinic today for follow-up evaluation and states***.  Today she denies chest pain, shortness of breath, lower extremity edema, fatigue, palpitations, melena, hematuria, hemoptysis, diaphoresis, weakness, presyncope, syncope, orthopnea, and PND.   Home Medications    Prior to Admission medications   Medication Sig Start Date End Date Taking? Authorizing Provider  albuterol  (PROVENTIL  HFA;VENTOLIN  HFA) 108 (90 BASE) MCG/ACT inhaler Inhale 2 puffs into the lungs every 6 (six) hours as needed for wheezing or shortness of breath. For wheezing    [provider]  amiodarone  (PACERONE ) 200 MG tablet Take 1 tablet (200 mg total) by mouth daily. Patient taking differently: Take 100 mg by mouth daily. 09/10/22   Croitoru, Mihai, MD  aspirin  EC 81 MG tablet Take 81 mg by mouth daily. 04/22/22   [provider]  atorvastatin  (LIPITOR) 80 MG tablet Take 80 mg by mouth at bedtime. Patient not taking: Reported on 08/18/2023    [provider]  bisoprolol  (ZEBETA ) 5 MG tablet Take 1 tablet (5 mg total) by mouth daily. 04/30/23   Croitoru, Mihai, MD  chlorpheniramine-HYDROcodone  (TUSSIONEX) 10-8 MG/5ML Take 5 mLs by mouth at bedtime as needed for cough. Patient not taking: Reported on 08/18/2023 06/11/23   Prosperi, Christian H, PA-C  cyclobenzaprine  (FLEXERIL ) 10 MG tablet Take 0.5-1 tablets (5-10 mg total) by mouth 2 (two) times daily as needed for muscle spasms. Patient not taking: Reported on 08/18/2023  04/08/23   Harris, Abigail, PA-C  dapagliflozin  propanediol (FARXIGA ) 10 MG TABS tablet Take 1 tablet (10 mg total) by mouth daily before breakfast. 01/09/23   Sheikh, Omair Latif, DO  ezetimibe  (ZETIA ) 10 MG tablet Take 1 tablet (10 mg total) by mouth daily. 01/10/23   Sheikh, Omair Latif, DO  ferrous sulfate  325 (65 FE) MG tablet Take 1 tablet (325 mg total) by mouth every morning. Patient taking differently: Take 325 mg by mouth daily. 01/10/23   Sherrill Cable Latif, DO  gabapentin  (NEURONTIN ) 400 MG capsule Take 1 capsule by mouth 2 (two) times daily. 04/22/23   [provider]  isosorbide  mononitrate (IMDUR ) 60 MG 24 hr tablet Take 1 tablet (60 mg total) by mouth daily. 01/09/23   Sheikh, Omair Latif, DO  nitroGLYCERIN  (NITROSTAT ) 0.4 MG SL tablet Place 0.4 mg under the tongue every 5 (five) minutes as needed for chest pain.    [provider]  ondansetron  (ZOFRAN -ODT) 4 MG disintegrating tablet Take  1 tablet (4 mg total) by mouth every 6 (six) hours as needed for nausea or vomiting. Patient not taking: Reported on 08/18/2023 06/11/23   Prosperi, Christian H, PA-C  sacubitril -valsartan  (ENTRESTO ) 97-103 MG Take 1 tablet by mouth 2 (two) times daily. 01/19/23   Croitoru, Mihai, MD  solifenacin (VESICARE) 5 MG tablet Take 1 tablet by mouth daily. 12/23/22   [provider]    Family History    Family History  Problem Relation Age of Onset   Stroke Mother    She indicated that the status of her mother is unknown.  Social History    Social History   Socioeconomic History   Marital status: Single    Spouse name: Not on file   Number of children: 3   Years of education: Not on file   Highest education level: Not on file  Occupational History    Comment: Unemployed  Tobacco Use   Smoking status: Every Day   Smokeless tobacco: Never   Tobacco comments:    04/30/2023 Patient still smokes daily    Patient smokes 1 blunt daily  Substance and Sexual Activity    Alcohol use: Yes    Alcohol/week: 5.0 standard drinks of alcohol    Types: 5 Cans of beer per week    Comment: socially on weekends   Drug use: Yes    Types: Marijuana   Sexual activity: Yes    Birth control/protection: None  Other Topics Concern   Not on file  Social History Narrative   Lives in [place], unemployed.  Single.  3 children ages 21,24,28.         Social Drivers of Corporate investment banker Strain: Not on File (11/18/2021)   Received from [employer]    Financial Resource Strain: 0  Food Insecurity: No Food Insecurity (08/17/2023)   Hunger Vital Sign    Worried About Running Out of Food in the Last Year: Never true    Ran Out of Food in the Last Year: Never true  Transportation Needs: No Transportation Needs (08/17/2023)   PRAPARE - Administrator, Civil Service (Medical): No    Lack of Transportation (Non-Medical): No  Physical Activity: Not on File (11/18/2021)   Received from [facility]   Physical Activity    Physical Activity: 0  Recent Concern: Physical Activity - At Risk (11/18/2021)   Received from [facility]   Physical Activity    Physical Activity: 2  Stress: Not on File (11/18/2021)   Received from [facility]   Stress    Stress: 0  Social Connections: Not on File (11/18/2021)   Received from [facility]   Social Connections    Connectedness: 0  Intimate Partner Violence: Not At Risk (08/17/2023)   Humiliation, Afraid, Rape, and Kick questionnaire    Fear of Current or Ex-Partner: No    Emotionally Abused: No    Physically Abused: No    Sexually Abused: No     Review of Systems    General:  No chills, fever, night sweats or weight changes.  Cardiovascular:  No chest pain, dyspnea on exertion, edema, orthopnea, palpitations, paroxysmal nocturnal dyspnea. Dermatological: No rash, lesions/masses Respiratory: No cough, dyspnea Urologic: No hematuria, dysuria Abdominal:   No nausea, vomiting, diarrhea, bright red blood per rectum,  melena, or hematemesis Neurologic:  No visual changes, wkns, changes in mental status. All other systems reviewed and are otherwise negative except as noted above.  Physical Exam  VS:  There were no vitals taken for this visit. , BMI There is no height or weight on file to calculate BMI. GEN: Well nourished, well developed, in no acute distress. HEENT: normal. Neck: Supple, no JVD, carotid bruits, or masses. Cardiac: RRR, no murmurs, rubs, or gallops. No clubbing, cyanosis, edema.  Radials/DP/PT 2+ and equal bilaterally.  Respiratory:  Respirations regular and unlabored, clear to auscultation bilaterally. GI: Soft, nontender, nondistended, BS + x 4. MS: no deformity or atrophy. Skin: warm and dry, no rash. Neuro:  Strength and sensation are intact. Psych: Normal affect.  Accessory Clinical Findings    Recent Labs: 01/08/2023: Magnesium 2.4 08/17/2023: B Natriuretic Peptide 166.2 08/18/2023: ALT 13; TSH 3.625 12/01/2023: BUN 29; Creatinine, Ser 1.16; Hemoglobin 14.2; Platelets 180; Potassium 4.0; Sodium 140   Recent Lipid Panel    Component Value Date/Time   CHOL 267 (H) 01/08/2023 0028   TRIG 199 (H) 01/08/2023 0028   HDL 66 01/08/2023 0028   CHOLHDL 4.0 01/08/2023 0028   VLDL 40 01/08/2023 0028   LDLCALC 161 (H) 01/08/2023 0028   LDLDIRECT 134 (H) 10/23/2009 2147    No BP recorded.  {Refresh Note OR Click here to enter BP  :1}***    ECG personally reviewed by me today-none today.    Echocardiogram 03/30/2023  IMPRESSIONS     1. Left ventricular ejection fraction, by estimation, is 40 to 45%. The  left ventricle has mildly decreased function. The left ventricle  demonstrates regional wall motion abnormalities (see scoring  diagram/findings for description). Left ventricular  diastolic parameters are consistent with Grade I diastolic dysfunction  (impaired relaxation).   2. Right ventricular systolic function is normal. The right ventricular  size is normal.  Tricuspid regurgitation signal is inadequate for assessing  PA pressure.   3. Left atrial size was mildly dilated.   4. A small pericardial effusion is present.   5. The mitral valve is normal in structure. No evidence of mitral valve  regurgitation. No evidence of mitral stenosis.   6. The aortic valve is normal in structure. Aortic valve regurgitation is  not visualized. No aortic stenosis is present.   7. The inferior vena cava is normal in size with greater than 50%  respiratory variability, suggesting right atrial pressure of 3 mmHg.   FINDINGS   Left Ventricle: Left ventricular ejection fraction, by estimation, is 40  to 45%. The left ventricle has mildly decreased function. The left  ventricle demonstrates regional wall motion abnormalities. The left  ventricular internal cavity size was normal  in size. There is no left ventricular hypertrophy. Left ventricular  diastolic parameters are consistent with Grade I diastolic dysfunction  (impaired relaxation).     LV Wall Scoring:  The inferior wall and posterior wall are hypokinetic. The entire anterior  wall, antero-lateral wall, entire septum, and entire apex are normal.   Right Ventricle: The right ventricular size is normal. No increase in  right ventricular wall thickness. Right ventricular systolic function is  normal. Tricuspid regurgitation signal is inadequate for assessing PA  pressure.   Left [facility]: Left atrial size was mildly dilated.   Right [facility]: Right atrial size was normal in size.   Pericardium: A small pericardial effusion is present.   Mitral Valve: The mitral valve is normal in structure. No evidence of  mitral valve regurgitation. No evidence of mitral valve stenosis.   Tricuspid Valve: The tricuspid valve is normal in structure. Tricuspid  valve regurgitation is not demonstrated. No evidence of  tricuspid  stenosis.   Aortic Valve: The aortic valve is normal in structure. Aortic valve   regurgitation is not visualized. No aortic stenosis is present.   Pulmonic Valve: The pulmonic valve was normal in structure. Pulmonic valve  regurgitation is not visualized. No evidence of pulmonic stenosis.   Aorta: The aortic root is normal in size and structure.   Venous: The inferior vena cava is normal in size with greater than 50%  respiratory variability, suggesting right atrial pressure of 3 mmHg.   IAS/Shunts: No atrial level shunt detected by color flow Doppler.    LHC 01/08/2023    Prox RCA lesion is 40% stenosed.   Mid Cx lesion is 40% stenosed.   1.  Right radial arterial loop requiring balloon assisted tracking to negotiate. 2.  Mild coronary artery disease unchanged from previous study (at [facility]). 3.  Fick cardiac output of 4.8 L/min and Fick cardiac index of 2.7 liters per minute per meter squared with the following hemodynamics:             Right atrial pressure mean of 3 mmHg             Right ventricular pressure 31/0 with an end-diastolic pressure of 8 mmHg             Wedge pressure mean of 10 mmHg             Pulmonary artery pressure of 33/12 with a mean of 18 mmHg             PVR of 2.9 Woods units             PA pulsatility index of 6.3   Recommendation: Medical therapy   Diagnostic Dominance: Right  Intervention   Assessment & Plan   1.  Chest pain-recent emergency department visit 12/01/2023.  Workup reassuring.  Cardiac troponins negative x 2.  No chest pain today.  She underwent LHC 10/24 which showed nonobstructive CAD. Patient reassured Heart healthy low-sodium diet No plans for ischemic evaluation  Essential hypertension-BP today12***0/89. Maintain blood pressure log Continue bisoprolol , Entresto , Imdur   Nonischemic cardiomyopathy-weight stable.  Well compensated.  Echocardiogram 03/30/2023 showed an LVEF of 40-45%, G1 DD, small pericardial effusion, and mildly dilated left atria.  BMP 12/01/2023 showed stable creatinine and  electrolytes. Heart healthy low-sodium diet Daily weights-weight log Maintain weight log Continue bisoprolol , Entresto , Imdur , Farxiga   SVT, VT arrest-heart rate today***.  Denies palpitations.  Is status post CRT-D. Continue bisoprolol  Avoid triggers caffeine , chocolate, EtOH, dehydration etc.  Hyperlipidemia-LDL 161 on last check. High-fiber diet Continue aspirin , bisoprolol , ezetimibe  Order fasting lipids and LFTs  Coronary artery disease-underwent cardiac catheterization 10/24 which showed nonobstructive CAD. Continue aspirin , bisoprolol , Imdur , sublingual nitroglycerin  as needed  Disposition: Follow-up with Dr. Francyne or me in 4-6***months.   Josefa HERO. Treyana Sturgell NP-C     12/07/2023, 7:46 AM [facility] [address] Office [phone] Fax [phone]    I spent 14***minutes examining this patient, reviewing medications, and using patient centered shared decision making involving their cardiac care.   I spent  20 minutes reviewing past medical history,  medications, and prior cardiac tests.

## 2023-12-08 ENCOUNTER — Ambulatory Visit: Admitting: General Practice

## 2023-12-11 ENCOUNTER — Ambulatory Visit (INDEPENDENT_AMBULATORY_CARE_PROVIDER_SITE_OTHER): Payer: No Typology Code available for payment source

## 2023-12-11 DIAGNOSIS — I471 Supraventricular tachycardia, unspecified: Secondary | ICD-10-CM

## 2023-12-11 LAB — CUP PACEART REMOTE DEVICE CHECK
Battery Remaining Longevity: 73 mo
Battery Remaining Percentage: 79 %
Battery Voltage: 2.99 V
Brady Statistic AP VP Percent: 42 %
Brady Statistic AP VS Percent: 1 %
Brady Statistic AS VP Percent: 56 %
Brady Statistic AS VS Percent: 1 %
Brady Statistic RA Percent Paced: 42 %
Date Time Interrogation Session: 20250919020246
HighPow Impedance: 70 Ohm
Implantable Lead Connection Status: 753985
Implantable Lead Connection Status: 753985
Implantable Lead Implant Date: 20240229
Implantable Lead Implant Date: 20240229
Implantable Lead Location: 753858
Implantable Lead Location: 753860
Implantable Pulse Generator Implant Date: 20240229
Lead Channel Impedance Value: 1025 Ohm
Lead Channel Impedance Value: 380 Ohm
Lead Channel Impedance Value: 480 Ohm
Lead Channel Pacing Threshold Amplitude: 1 V
Lead Channel Pacing Threshold Amplitude: 1 V
Lead Channel Pacing Threshold Amplitude: 1.5 V
Lead Channel Pacing Threshold Pulse Width: 0.5 ms
Lead Channel Pacing Threshold Pulse Width: 0.5 ms
Lead Channel Pacing Threshold Pulse Width: 0.5 ms
Lead Channel Sensing Intrinsic Amplitude: 12 mV
Lead Channel Sensing Intrinsic Amplitude: 5 mV
Lead Channel Setting Pacing Amplitude: 2 V
Lead Channel Setting Pacing Amplitude: 2 V
Lead Channel Setting Pacing Amplitude: 2.5 V
Lead Channel Setting Pacing Pulse Width: 0.5 ms
Lead Channel Setting Pacing Pulse Width: 0.5 ms
Lead Channel Setting Sensing Sensitivity: 0.5 mV
Pulse Gen Serial Number: 211015531
Zone Setting Status: 755011

## 2023-12-15 NOTE — Progress Notes (Signed)
Remote ICD Transmission.

## 2023-12-19 ENCOUNTER — Ambulatory Visit: Payer: Self-pay | Admitting: Cardiovascular Disease

## 2023-12-22 ENCOUNTER — Other Ambulatory Visit: Payer: Self-pay | Admitting: Family Medicine

## 2023-12-22 DIAGNOSIS — Z1231 Encounter for screening mammogram for malignant neoplasm of breast: Secondary | ICD-10-CM

## 2024-01-07 NOTE — Progress Notes (Deleted)
 Cardiology Clinic Note   Patient Name: [name] Date of Encounter: 01/07/2024  Primary Care Provider:  Geneva Maffucci, FNP Primary Cardiologist:  Jerel Balding, MD  Patient Profile    [name] 58 year old female presents to the clinic today for follow-up evaluation of her chronic combined systolic and diastolic CHF, chest pain, and and hypertension.  Past Medical History    Past Medical History:  Diagnosis Date   Abnormal TSH    10/2011 - additional hormones pending   Anxiety    Asthma    Atypical chest pain    recent low risk myoview    Coronary artery spasm    a. NSTEMI 10/2011 with no CAD by cath but evidence of spasm when injecting left coronary artery.    Hyperlipidemia    Hypertension    LBBB (left bundle branch block)    Noncompliance    SVT (supraventricular tachycardia) 10/22/10   wide complex tachycardia of same QRS as intinsic LBBB   Past Surgical History:  Procedure Laterality Date   LEFT HEART CATHETERIZATION WITH CORONARY ANGIOGRAM N/A 11/20/2011   Procedure: LEFT HEART CATHETERIZATION WITH CORONARY ANGIOGRAM;  Surgeon: Ozell Fell, MD;  Location: [facility] CATH LAB;  Service: Cardiovascular;  Laterality: N/A;   RIGHT/LEFT HEART CATH AND CORONARY ANGIOGRAPHY N/A 01/08/2023   Procedure: RIGHT/LEFT HEART CATH AND CORONARY ANGIOGRAPHY;  Surgeon: Wendel Lurena POUR, MD;  Location: [facility] INVASIVE CV LAB;  Service: Cardiovascular;  Laterality: N/A;   TUBAL LIGATION      Allergies  Allergies  Allergen Reactions   Celebrex  [Celecoxib ] Rash   Amoxicillin Hives   Penicillins Hives and Itching   Shellfish Allergy Other (See Comments)    It makes my tongue swell and itch but I eat it anyway. She usually takes a Benadryl before eating it.   Shellfish Protein-Containing Drug Products Other (See Comments)    It makes my tongue swell and itch but I eat it anyway. She usually takes a Benadryl before eating it.    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  Her PMH also includes NSTEMI in 2013 due to vasospasm and longstanding NICM.  Cardiac MRI 2024 showed LV systolic dysfunction of 20-25%.  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  She presents to the hospital on 08/17/2023 and was discharged on 08/18/2023.  She presented with chest pain.  Chest x-ray showed pulmonary vascular congestion with cardiomegaly.  Cardiology was consulted.  Patient's D-dimer was negative.  High-sensitivity troponins were low and flat.  Her EKG showed old infarct with prolonged QTc and no acute ST changes.  Due to her prior history she was admitted to the hospital.  Her echocardiogram 1/25 showed an EF of 45%, G1 DD, and small pericardial effusion.  On cardiology exams she was noted to have pain with palpitation.  PPM noted on her left clavicle.  Her cardiac catheterization 10/24 showed no obstructive CAD.  Her BNP was noted to be 166.  She was felt to have atypical noncardiac pain.  Outpatient cardiology follow-up was recommended.  She presented to the clinic 09/03/23 for follow-up evaluation and stated she had not been exercising formally but had been working 2 jobs.  We reviewed her recent hospitalization.  She and her husband expressed understanding.  Her blood pressure was well-controlled  at 120/90.  She reported that she was previously on Farxiga  but did not know why the medication was stopped.  Her last lipid panel was in October.  Her LDL cholesterol at that time was noted to be 161.  We reviewed this.  I will restarted Farxiga , repeated BMP, fasting lipids and LFTs, give the low-sodium diet information and planned follow-up in 3 months.  She was seen in the emergency department on 12/01/2023 with chest pain.  Her EKG showed AV paced rhythm.  She described pain in the center of  her chest that did not radiate.  She noted shortness of breath.  She reported that her pain lasted for 5 minutes and then resolved.  She was asymptomatic at the time of evaluation.  She reported that her pain was worse with laying on her side but improved with standing up.  Pain was not exertional.  She had not taken her blood pressure medication.  Case was discussed with Dr. Lonni.  Her cardiac troponins were negative x 2.  Chest x-ray showed no acute findings.  She presents to the clinic today for follow-up evaluation and states***.  Today she denies chest pain, shortness of breath, lower extremity edema, fatigue, palpitations, melena, hematuria, hemoptysis, diaphoresis, weakness, presyncope, syncope, orthopnea, and PND.   Home Medications    Prior to Admission medications   Medication Sig Start Date End Date Taking? Authorizing Provider  albuterol  (PROVENTIL  HFA;VENTOLIN  HFA) 108 (90 BASE) MCG/ACT inhaler Inhale 2 puffs into the lungs every 6 (six) hours as needed for wheezing or shortness of breath. For wheezing    [provider]  amiodarone  (PACERONE ) 200 MG tablet Take 1 tablet (200 mg total) by mouth daily. Patient taking differently: Take 100 mg by mouth daily. 09/10/22   Croitoru, Mihai, MD  aspirin  EC 81 MG tablet Take 81 mg by mouth daily. 04/22/22   [provider]  atorvastatin  (LIPITOR) 80 MG tablet Take 80 mg by mouth at bedtime. Patient not taking: Reported on 08/18/2023    [provider]  bisoprolol  (ZEBETA ) 5 MG tablet Take 1 tablet (5 mg total) by mouth daily. 04/30/23   Croitoru, Mihai, MD  chlorpheniramine-HYDROcodone  (TUSSIONEX) 10-8 MG/5ML Take 5 mLs by mouth at bedtime as needed for cough. Patient not taking: Reported on 08/18/2023 06/11/23   Prosperi, Christian H, PA-C  cyclobenzaprine  (FLEXERIL ) 10 MG tablet Take 0.5-1 tablets (5-10 mg total) by mouth 2 (two) times daily as needed for muscle spasms. Patient not taking: Reported on 08/18/2023  04/08/23   Harris, Abigail, PA-C  dapagliflozin  propanediol (FARXIGA ) 10 MG TABS tablet Take 1 tablet (10 mg total) by mouth daily before breakfast. 01/09/23   Sheikh, Omair Latif, DO  ezetimibe  (ZETIA ) 10 MG tablet Take 1 tablet (10 mg total) by mouth daily. 01/10/23   Sheikh, Omair Latif, DO  ferrous sulfate  325 (65 FE) MG tablet Take 1 tablet (325 mg total) by mouth every morning. Patient taking differently: Take 325 mg by mouth daily. 01/10/23   Sheikh, Alejandro Latif, DO  gabapentin  (NEURONTIN ) 400 MG capsule Take 1 capsule by mouth 2 (two) times daily. 04/22/23   [provider]  isosorbide  mononitrate (IMDUR ) 60 MG 24 hr tablet Take 1 tablet (60 mg total) by mouth daily. 01/09/23   Sheikh, Alejandro Latif, DO  nitroGLYCERIN  (NITROSTAT ) 0.4 MG SL tablet Place 0.4 mg under the tongue every 5 (five) minutes as needed for chest pain.    [provider]  ondansetron  (ZOFRAN -ODT) 4 MG disintegrating tablet Take  1 tablet (4 mg total) by mouth every 6 (six) hours as needed for nausea or vomiting. Patient not taking: Reported on 08/18/2023 06/11/23   Prosperi, Christian H, PA-C  sacubitril -valsartan  (ENTRESTO ) 97-103 MG Take 1 tablet by mouth 2 (two) times daily. 01/19/23   Croitoru, Mihai, MD  solifenacin (VESICARE) 5 MG tablet Take 1 tablet by mouth daily. 12/23/22   [provider]    Family History    Family History  Problem Relation Age of Onset   Stroke Mother    She indicated that the status of her mother is unknown.  Social History    Social History   Socioeconomic History   Marital status: Single    Spouse name: Not on file   Number of children: 3   Years of education: Not on file   Highest education level: Not on file  Occupational History    Comment: Unemployed  Tobacco Use   Smoking status: Every Day   Smokeless tobacco: Never   Tobacco comments:    04/30/2023 Patient still smokes daily    Patient smokes 1 blunt daily  Substance and Sexual Activity    Alcohol use: Yes    Alcohol/week: 5.0 standard drinks of alcohol    Types: 5 Cans of beer per week    Comment: socially on weekends   Drug use: Yes    Types: Marijuana   Sexual activity: Yes    Birth control/protection: None  Other Topics Concern   Not on file  Social History Narrative   Lives in [place], unemployed.  Single.  3 children ages 64,24,28.         Social Drivers of Health   Financial Resource Strain: Not at Risk (12/21/2023)   Received from [employer]    How hard is it for you to pay for the very basics like food, housing, heating, medical care, and medications?: 1  Food Insecurity: Not at Risk (12/21/2023)   Received from [facility] Insecurity    Within the past 12 months, you worried that your food would run out before you got money to buy more.: 1  Transportation Needs: Not at Risk (12/21/2023)   Received from [facility] Needs    In the past 12 months, has lack of transportation kept you from medical appointments, meetings, work or from getting things needed for daily living?: 1  Physical Activity: At Risk (12/21/2023)   Received from [facility]   Physical Activity    On average, how many minutes do you engage in exercise at this level?: 2  Stress: Not at Risk (12/21/2023)   Received from [facility]   Stress    Do you feel these kinds of stress these days?: 1  Social Connections: Not at Risk (12/21/2023)   Received from [facility]   Social Connections    How often do you see or talk to people that you care about and feel close to? (For example: talking to friends on phone, visiting friends or family, going to church or club meetings): 1  Intimate Partner Violence: Not At Risk (08/17/2023)   Humiliation, Afraid, Rape, and Kick questionnaire    Fear of Current or Ex-Partner: No    Emotionally Abused: No    Physically Abused: No    Sexually Abused: No     Review of Systems    General:  No chills, fever, night sweats or weight changes.   Cardiovascular:  No chest pain, dyspnea on exertion, edema,  orthopnea, palpitations, paroxysmal nocturnal dyspnea. Dermatological: No rash, lesions/masses Respiratory: No cough, dyspnea Urologic: No hematuria, dysuria Abdominal:   No nausea, vomiting, diarrhea, bright red blood per rectum, melena, or hematemesis Neurologic:  No visual changes, wkns, changes in mental status. All other systems reviewed and are otherwise negative except as noted above.  Physical Exam    VS:  There were no vitals taken for this visit. , BMI There is no height or weight on file to calculate BMI. GEN: Well nourished, well developed, in no acute distress. HEENT: normal. Neck: Supple, no JVD, carotid bruits, or masses. Cardiac: RRR, no murmurs, rubs, or gallops. No clubbing, cyanosis, edema.  Radials/DP/PT 2+ and equal bilaterally.  Respiratory:  Respirations regular and unlabored, clear to auscultation bilaterally. GI: Soft, nontender, nondistended, BS + x 4. MS: no deformity or atrophy. Skin: warm and dry, no rash. Neuro:  Strength and sensation are intact. Psych: Normal affect.  Accessory Clinical Findings    Recent Labs: 01/08/2023: Magnesium 2.4 08/17/2023: B Natriuretic Peptide 166.2 08/18/2023: ALT 13; TSH 3.625 12/01/2023: BUN 29; Creatinine, Ser 1.16; Hemoglobin 14.2; Platelets 180; Potassium 4.0; Sodium 140   Recent Lipid Panel    Component Value Date/Time   CHOL 267 (H) 01/08/2023 0028   TRIG 199 (H) 01/08/2023 0028   HDL 66 01/08/2023 0028   CHOLHDL 4.0 01/08/2023 0028   VLDL 40 01/08/2023 0028   LDLCALC 161 (H) 01/08/2023 0028   LDLDIRECT 134 (H) 10/23/2009 2147    No BP recorded.  {Refresh Note OR Click here to enter BP  :1}***    ECG personally reviewed by me today-none today.    Echocardiogram 03/30/2023  IMPRESSIONS     1. Left ventricular ejection fraction, by estimation, is 40 to 45%. The  left ventricle has mildly decreased function. The left ventricle  demonstrates  regional wall motion abnormalities (see scoring  diagram/findings for description). Left ventricular  diastolic parameters are consistent with Grade I diastolic dysfunction  (impaired relaxation).   2. Right ventricular systolic function is normal. The right ventricular  size is normal. Tricuspid regurgitation signal is inadequate for assessing  PA pressure.   3. Left atrial size was mildly dilated.   4. A small pericardial effusion is present.   5. The mitral valve is normal in structure. No evidence of mitral valve  regurgitation. No evidence of mitral stenosis.   6. The aortic valve is normal in structure. Aortic valve regurgitation is  not visualized. No aortic stenosis is present.   7. The inferior vena cava is normal in size with greater than 50%  respiratory variability, suggesting right atrial pressure of 3 mmHg.   FINDINGS   Left Ventricle: Left ventricular ejection fraction, by estimation, is 40  to 45%. The left ventricle has mildly decreased function. The left  ventricle demonstrates regional wall motion abnormalities. The left  ventricular internal cavity size was normal  in size. There is no left ventricular hypertrophy. Left ventricular  diastolic parameters are consistent with Grade I diastolic dysfunction  (impaired relaxation).     LV Wall Scoring:  The inferior wall and posterior wall are hypokinetic. The entire anterior  wall, antero-lateral wall, entire septum, and entire apex are normal.   Right Ventricle: The right ventricular size is normal. No increase in  right ventricular wall thickness. Right ventricular systolic function is  normal. Tricuspid regurgitation signal is inadequate for assessing PA  pressure.   Left [facility]: Left atrial size was mildly dilated.   Right [facility]: Right  atrial size was normal in size.   Pericardium: A small pericardial effusion is present.   Mitral Valve: The mitral valve is normal in structure. No evidence of  mitral  valve regurgitation. No evidence of mitral valve stenosis.   Tricuspid Valve: The tricuspid valve is normal in structure. Tricuspid  valve regurgitation is not demonstrated. No evidence of tricuspid  stenosis.   Aortic Valve: The aortic valve is normal in structure. Aortic valve  regurgitation is not visualized. No aortic stenosis is present.   Pulmonic Valve: The pulmonic valve was normal in structure. Pulmonic valve  regurgitation is not visualized. No evidence of pulmonic stenosis.   Aorta: The aortic root is normal in size and structure.   Venous: The inferior vena cava is normal in size with greater than 50%  respiratory variability, suggesting right atrial pressure of 3 mmHg.   IAS/Shunts: No atrial level shunt detected by color flow Doppler.    LHC 01/08/2023    Prox RCA lesion is 40% stenosed.   Mid Cx lesion is 40% stenosed.   1.  Right radial arterial loop requiring balloon assisted tracking to negotiate. 2.  Mild coronary artery disease unchanged from previous study (at [facility]). 3.  Fick cardiac output of 4.8 L/min and Fick cardiac index of 2.7 liters per minute per meter squared with the following hemodynamics:             Right atrial pressure mean of 3 mmHg             Right ventricular pressure 31/0 with an end-diastolic pressure of 8 mmHg             Wedge pressure mean of 10 mmHg             Pulmonary artery pressure of 33/12 with a mean of 18 mmHg             PVR of 2.9 Woods units             PA pulsatility index of 6.3   Recommendation: Medical therapy   Diagnostic Dominance: Right  Intervention   Assessment & Plan   1.  Chest pain-recent emergency department visit 12/01/2023.  Workup reassuring.  Cardiac troponins negative x 2.  No chest pain today.  She underwent LHC 10/24 which showed nonobstructive CAD. Patient reassured Heart healthy low-sodium diet No plans for ischemic evaluation  Essential hypertension-BP today12***0/89. Maintain blood  pressure log Continue bisoprolol , Entresto , Imdur   Nonischemic cardiomyopathy-weight st***able.  Well compensated.  Echocardiogram 03/30/2023 showed an LVEF of 40-45%, G1 DD, small pericardial effusion, and mildly dilated left atria.  BMP 12/01/2023 showed stable creatinine and electrolytes. Heart healthy low-sodium diet Daily weights-weight log Maintain weight log Continue bisoprolol , Entresto , Imdur , Farxiga   SVT, VT arrest-heart rate today***.  Denies palpitations.  Is status post CRT-D. Continue bisoprolol  Avoid triggers caffeine , chocolate, EtOH, dehydration etc.  Hyperlipidemia-LDL 161 on last check. High-fiber diet Continue aspirin , bisoprolol , ezetimibe  Order fasting lipids and LFTs  Coronary artery disease-underwent cardiac catheterization 10/24 which showed nonobstructive CAD. Continue aspirin , bisoprolol , Imdur , sublingual nitroglycerin  as needed  Disposition: Follow-up with Dr. Francyne or me in 4-6***months.   Josefa HERO. Jobany Montellano NP-C     01/07/2024, 7:35 AM [facility] [address] Office [phone] Fax [phone]    I spent 14***minutes examining this patient, reviewing medications, and using patient centered shared decision making involving their cardiac care.   I spent  20 minutes reviewing past medical history,  medications, and prior cardiac tests.

## 2024-01-11 ENCOUNTER — Ambulatory Visit: Admitting: General Practice

## 2024-01-14 ENCOUNTER — Ambulatory Visit

## 2024-02-02 ENCOUNTER — Ambulatory Visit
Admission: RE | Admit: 2024-02-02 | Discharge: 2024-02-02 | Disposition: A | Source: Ambulatory Visit | Attending: Family Medicine | Admitting: Family Medicine

## 2024-02-02 DIAGNOSIS — Z1231 Encounter for screening mammogram for malignant neoplasm of breast: Secondary | ICD-10-CM

## 2024-02-15 NOTE — Progress Notes (Unsigned)
 Cardiology Clinic Note   Patient Name: [name] Date of Encounter: 02/17/2024  Primary Care Provider:  Geneva Maffucci, FNP Primary Cardiologist:  Jerel Balding, MD  Patient Profile    [name] 58 year old female presents to the clinic today for follow-up evaluation of her chronic combined systolic and diastolic CHF, chest pain, and and hypertension.  Past Medical History    Past Medical History:  Diagnosis Date   Abnormal TSH    10/2011 - additional hormones pending   Anxiety    Asthma    Atypical chest pain    recent low risk myoview    Coronary artery spasm    a. NSTEMI 10/2011 with no CAD by cath but evidence of spasm when injecting left coronary artery.    Hyperlipidemia    Hypertension    LBBB (left bundle branch block)    Noncompliance    SVT (supraventricular tachycardia) 10/22/10   wide complex tachycardia of same QRS as intinsic LBBB   Past Surgical History:  Procedure Laterality Date   LEFT HEART CATHETERIZATION WITH CORONARY ANGIOGRAM N/A 11/20/2011   Procedure: LEFT HEART CATHETERIZATION WITH CORONARY ANGIOGRAM;  Surgeon: Ozell Fell, MD;  Location: [facility] CATH LAB;  Service: Cardiovascular;  Laterality: N/A;   RIGHT/LEFT HEART CATH AND CORONARY ANGIOGRAPHY N/A 01/08/2023   Procedure: RIGHT/LEFT HEART CATH AND CORONARY ANGIOGRAPHY;  Surgeon: Wendel Lurena POUR, MD;  Location: [facility] INVASIVE CV LAB;  Service: Cardiovascular;  Laterality: N/A;   TUBAL LIGATION      Allergies  Allergies  Allergen Reactions   Celebrex  [Celecoxib ] Rash   Amoxicillin Hives   Penicillins Hives and Itching   Shellfish Allergy Other (See Comments)    It makes my tongue swell and itch but I eat it anyway. She usually takes a Benadryl before eating it.   Shellfish Protein-Containing Drug Products Other (See Comments)    It makes my tongue swell and itch but I eat it anyway. She usually takes a Benadryl before eating it.    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  Her PMH also includes NSTEMI in 2013 due to vasospasm and longstanding NICM.  Cardiac MRI 2024 showed LV systolic dysfunction of 20-25%.  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  She presents to the hospital on 08/17/2023 and was discharged on 08/18/2023.  She presented with chest pain.  Chest x-ray showed pulmonary vascular congestion with cardiomegaly.  Cardiology was consulted.  Patient's D-dimer was negative.  High-sensitivity troponins were low and flat.  Her EKG showed old infarct with prolonged QTc and no acute ST changes.  Due to her prior history she was admitted to the hospital.  Her echocardiogram 1/25 showed an EF of 45%, G1 DD, and small pericardial effusion.  On cardiology exams she was noted to have pain with palpitation.  PPM noted on her left clavicle.  Her cardiac catheterization 10/24 showed no obstructive CAD.  Her BNP was noted to be 166.  She was felt to have atypical noncardiac pain.  Outpatient cardiology follow-up was recommended.  She presented to the clinic 09/03/23 for follow-up evaluation and stated she had not been exercising formally but had been working 2 jobs.  We reviewed her recent hospitalization.  She and her husband expressed understanding.  Her blood pressure was well-controlled  at 120/90.  She reported that she was previously on Farxiga  but did not know why the medication was stopped.  Her last lipid panel was in October.  Her LDL cholesterol at that time was noted to be 161.  We reviewed this.  I  restart Farxiga , repeated BMP, fasting lipids and LFTs, and planned follow-up in 3 months.  She presents to the clinic today for follow-up evaluation states she was in the emergency department in the early part of September with chest pain.  She had blood work and chest  x-ray.  She was noted to have low flat cardiac troponins.  Her symptoms were not felt to be cardiac.  Her chest pain resolved quickly.  She was recently at her PCP and placed on Ozempic.  She is tolerating this well.  We reviewed the importance of upper and lower body weightbearing activity.  She expressed understanding.  She continues to work 2 jobs.  She has recently moved.  I will order echocardiogram, fasting lipids and LFTs and plan follow-up in 4 to 6 months.  Today she denies chest pain, shortness of breath, lower extremity edema, fatigue, palpitations, melena, hematuria, hemoptysis, diaphoresis, weakness, presyncope, syncope, orthopnea, and PND.   Home Medications    Prior to Admission medications   Medication Sig Start Date End Date Taking? Authorizing Provider  albuterol  (PROVENTIL  HFA;VENTOLIN  HFA) 108 (90 BASE) MCG/ACT inhaler Inhale 2 puffs into the lungs every 6 (six) hours as needed for wheezing or shortness of breath. For wheezing    [provider]  amiodarone  (PACERONE ) 200 MG tablet Take 1 tablet (200 mg total) by mouth daily. Patient taking differently: Take 100 mg by mouth daily. 09/10/22   Croitoru, Mihai, MD  aspirin  EC 81 MG tablet Take 81 mg by mouth daily. 04/22/22   [provider]  atorvastatin  (LIPITOR) 80 MG tablet Take 80 mg by mouth at bedtime. Patient not taking: Reported on 08/18/2023    [provider]  bisoprolol  (ZEBETA ) 5 MG tablet Take 1 tablet (5 mg total) by mouth daily. 04/30/23   Croitoru, Mihai, MD  chlorpheniramine-HYDROcodone  (TUSSIONEX) 10-8 MG/5ML Take 5 mLs by mouth at bedtime as needed for cough. Patient not taking: Reported on 08/18/2023 06/11/23   Prosperi, Christian H, PA-C  cyclobenzaprine  (FLEXERIL ) 10 MG tablet Take 0.5-1 tablets (5-10 mg total) by mouth 2 (two) times daily as needed for muscle spasms. Patient not taking: Reported on 08/18/2023 04/08/23   Harris, Abigail, PA-C  dapagliflozin  propanediol (FARXIGA ) 10 MG TABS  tablet Take 1 tablet (10 mg total) by mouth daily before breakfast. 01/09/23   Sheikh, Omair Latif, DO  ezetimibe  (ZETIA ) 10 MG tablet Take 1 tablet (10 mg total) by mouth daily. 01/10/23   Sheikh, Omair Latif, DO  ferrous sulfate  325 (65 FE) MG tablet Take 1 tablet (325 mg total) by mouth every morning. Patient taking differently: Take 325 mg by mouth daily. 01/10/23   Sheikh, Alejandro Latif, DO  gabapentin  (NEURONTIN ) 400 MG capsule Take 1 capsule by mouth 2 (two) times daily. 04/22/23   [provider]  isosorbide  mononitrate (IMDUR ) 60 MG 24 hr tablet Take 1 tablet (60 mg total) by mouth daily. 01/09/23   Sherrill Alejandro Latif, DO  nitroGLYCERIN  (NITROSTAT ) 0.4 MG SL tablet Place 0.4 mg under the tongue every 5 (five) minutes as needed for chest pain.    [provider]  ondansetron  (ZOFRAN -ODT) 4 MG disintegrating tablet Take 1 tablet (4 mg total) by mouth every 6 (six) hours as  needed for nausea or vomiting. Patient not taking: Reported on 08/18/2023 06/11/23   Prosperi, Christian H, PA-C  sacubitril -valsartan  (ENTRESTO ) 97-103 MG Take 1 tablet by mouth 2 (two) times daily. 01/19/23   Croitoru, Mihai, MD  solifenacin (VESICARE) 5 MG tablet Take 1 tablet by mouth daily. 12/23/22   [provider]    Family History    Family History  Problem Relation Age of Onset   Stroke Mother    She indicated that the status of her mother is unknown.  Social History    Social History   Socioeconomic History   Marital status: Single    Spouse name: Not on file   Number of children: 3   Years of education: Not on file   Highest education level: Not on file  Occupational History    Comment: Unemployed  Tobacco Use   Smoking status: Every Day   Smokeless tobacco: Never   Tobacco comments:    04/30/2023 Patient still smokes daily    Patient smokes 1 blunt daily  Substance and Sexual Activity   Alcohol use: Yes    Alcohol/week: 5.0 standard drinks of alcohol    Types: 5  Cans of beer per week    Comment: socially on weekends   Drug use: Yes    Types: Marijuana   Sexual activity: Yes    Birth control/protection: None  Other Topics Concern   Not on file  Social History Narrative   Lives in [place], unemployed.  Single.  3 children ages 86,24,28.         Social Drivers of Health   Financial Resource Strain: Not at Risk (12/21/2023)   Received from [employer]    How hard is it for you to pay for the very basics like food, housing, heating, medical care, and medications?: 1  Food Insecurity: Not at Risk (12/21/2023)   Received from [facility] Insecurity    Within the past 12 months, you worried that your food would run out before you got money to buy more.: 1  Transportation Needs: Not at Risk (12/21/2023)   Received from [facility] Needs    In the past 12 months, has lack of transportation kept you from medical appointments, meetings, work or from getting things needed for daily living?: 1  Physical Activity: At Risk (12/21/2023)   Received from [facility]   Physical Activity    On average, how many minutes do you engage in exercise at this level?: 2  Stress: Not at Risk (12/21/2023)   Received from [facility]   Stress    Do you feel these kinds of stress these days?: 1  Social Connections: Not at Risk (12/21/2023)   Received from [facility]   Social Connections    How often do you see or talk to people that you care about and feel close to? (For example: talking to friends on phone, visiting friends or family, going to church or club meetings): 1  Intimate Partner Violence: Not At Risk (08/17/2023)   Humiliation, Afraid, Rape, and Kick questionnaire    Fear of Current or Ex-Partner: No    Emotionally Abused: No    Physically Abused: No    Sexually Abused: No     Review of Systems    General:  No chills, fever, night sweats or weight changes.  Cardiovascular:  No chest pain, dyspnea on exertion, edema, orthopnea,  palpitations, paroxysmal nocturnal dyspnea. Dermatological: No rash, lesions/masses Respiratory: No cough,  dyspnea Urologic: No hematuria, dysuria Abdominal:   No nausea, vomiting, diarrhea, bright red blood per rectum, melena, or hematemesis Neurologic:  No visual changes, wkns, changes in mental status. All other systems reviewed and are otherwise negative except as noted above.  Physical Exam    VS:  BP 128/82   Ht 5' 1 (1.549 m)   Wt 195 lb 6.4 oz (88.6 kg)   BMI 36.92 kg/m  , BMI Body mass index is 36.92 kg/m. GEN: Well nourished, well developed, in no acute distress. HEENT: normal. Neck: Supple, no JVD, carotid bruits, or masses. Cardiac: RRR, no murmurs, rubs, or gallops. No clubbing, cyanosis, edema.  Radials/DP/PT 2+ and equal bilaterally.  Respiratory:  Respirations regular and unlabored, clear to auscultation bilaterally. GI: Soft, nontender, nondistended, BS + x 4. MS: no deformity or atrophy. Skin: warm and dry, no rash. Neuro:  Strength and sensation are intact. Psych: Normal affect.  Accessory Clinical Findings    Recent Labs: 08/17/2023: B Natriuretic Peptide 166.2 08/18/2023: ALT 13; TSH 3.625 12/01/2023: BUN 29; Creatinine, Ser 1.16; Hemoglobin 14.2; Platelets 180; Potassium 4.0; Sodium 140   Recent Lipid Panel    Component Value Date/Time   CHOL 267 (H) 01/08/2023 0028   TRIG 199 (H) 01/08/2023 0028   HDL 66 01/08/2023 0028   CHOLHDL 4.0 01/08/2023 0028   VLDL 40 01/08/2023 0028   LDLCALC 161 (H) 01/08/2023 0028   LDLDIRECT 134 (H) 10/23/2009 2147         ECG personally reviewed by me today-none today.    Echocardiogram 03/30/2023  IMPRESSIONS     1. Left ventricular ejection fraction, by estimation, is 40 to 45%. The  left ventricle has mildly decreased function. The left ventricle  demonstrates regional wall motion abnormalities (see scoring  diagram/findings for description). Left ventricular  diastolic parameters are consistent with Grade  I diastolic dysfunction  (impaired relaxation).   2. Right ventricular systolic function is normal. The right ventricular  size is normal. Tricuspid regurgitation signal is inadequate for assessing  PA pressure.   3. Left atrial size was mildly dilated.   4. A small pericardial effusion is present.   5. The mitral valve is normal in structure. No evidence of mitral valve  regurgitation. No evidence of mitral stenosis.   6. The aortic valve is normal in structure. Aortic valve regurgitation is  not visualized. No aortic stenosis is present.   7. The inferior vena cava is normal in size with greater than 50%  respiratory variability, suggesting right atrial pressure of 3 mmHg.   FINDINGS   Left Ventricle: Left ventricular ejection fraction, by estimation, is 40  to 45%. The left ventricle has mildly decreased function. The left  ventricle demonstrates regional wall motion abnormalities. The left  ventricular internal cavity size was normal  in size. There is no left ventricular hypertrophy. Left ventricular  diastolic parameters are consistent with Grade I diastolic dysfunction  (impaired relaxation).     LV Wall Scoring:  The inferior wall and posterior wall are hypokinetic. The entire anterior  wall, antero-lateral wall, entire septum, and entire apex are normal.   Right Ventricle: The right ventricular size is normal. No increase in  right ventricular wall thickness. Right ventricular systolic function is  normal. Tricuspid regurgitation signal is inadequate for assessing PA  pressure.   Left [facility]: Left atrial size was mildly dilated.   Right [facility]: Right atrial size was normal in size.   Pericardium: A small pericardial effusion is present.  Mitral Valve: The mitral valve is normal in structure. No evidence of  mitral valve regurgitation. No evidence of mitral valve stenosis.   Tricuspid Valve: The tricuspid valve is normal in structure. Tricuspid  valve regurgitation  is not demonstrated. No evidence of tricuspid  stenosis.   Aortic Valve: The aortic valve is normal in structure. Aortic valve  regurgitation is not visualized. No aortic stenosis is present.   Pulmonic Valve: The pulmonic valve was normal in structure. Pulmonic valve  regurgitation is not visualized. No evidence of pulmonic stenosis.   Aorta: The aortic root is normal in size and structure.   Venous: The inferior vena cava is normal in size with greater than 50%  respiratory variability, suggesting right atrial pressure of 3 mmHg.   IAS/Shunts: No atrial level shunt detected by color flow Doppler.    LHC 01/08/2023    Prox RCA lesion is 40% stenosed.   Mid Cx lesion is 40% stenosed.   1.  Right radial arterial loop requiring balloon assisted tracking to negotiate. 2.  Mild coronary artery disease unchanged from previous study (at [facility]). 3.  Fick cardiac output of 4.8 L/min and Fick cardiac index of 2.7 liters per minute per meter squared with the following hemodynamics:             Right atrial pressure mean of 3 mmHg             Right ventricular pressure 31/0 with an end-diastolic pressure of 8 mmHg             Wedge pressure mean of 10 mmHg             Pulmonary artery pressure of 33/12 with a mean of 18 mmHg             PVR of 2.9 Woods units             PA pulsatility index of 6.3   Recommendation: Medical therapy   Diagnostic Dominance: Right  Intervention   Assessment & Plan   1.  Nonischemic cardiomyopathy-staying very active.  Continues to work 2 jobs.  Denies increased DOE or activity intolerance.  Echocardiogram 03/30/2023 showed an LVEF of 40-45%, G1 DD, small pericardial effusion, and mildly dilated left atria.  BMP 12/01/2023 showed creatinine of 1.16 and eGFR of 54. Heart healthy low-sodium diet-reviewed Daily weights-continue Maintain weight log Continue bisoprolol , Entresto , Imdur , Farxiga  Repeat echocardiogram  Coronary artery disease-today she  denies exertional chest pain.  No recent episodes of chest discomfort pain or pressure.  She underwent LHC 10/24 which showed nonobstructive CAD. Rest affected area Heart healthy low-sodium diet Continue aspirin , amlodipine, bisoprolol , Zetia , atorvastatin   Essential hypertension-BP today 128/82 Continue amlodipine, bisoprolol , Entresto  Heart healthy low-sodium diet Maintain blood pressure log  SVT, VT arrest-denies episodes of accelerated or irregular heartbeat.  Is status post CRT-D. Continue bisoprolol  Avoid triggers caffeine , chocolate, EtOH, dehydration etc. Follows with EP.  Hyperlipidemia-LDL 161 on last check.  High-fiber diet Continue aspirin , ezetimibe  Order fasting lipids and LFTs   Disposition: Follow-up with Dr. Francyne or me in 4-6 months.   Josefa HERO. Taygen Newsome NP-C     02/17/2024, 8:15 AM [facility] [address] Office [phone] Fax [phone]    I spent 14 minutes examining this patient, reviewing medications, and using patient centered shared decision making involving their cardiac care.   I spent  20 minutes reviewing past medical history,  medications, and prior cardiac tests.

## 2024-02-17 ENCOUNTER — Encounter: Payer: Self-pay | Admitting: General Practice

## 2024-02-17 ENCOUNTER — Ambulatory Visit: Attending: General Practice | Admitting: General Practice

## 2024-02-17 VITALS — BP 128/82 | Ht 61.0 in | Wt 195.4 lb

## 2024-02-17 DIAGNOSIS — I10 Essential (primary) hypertension: Secondary | ICD-10-CM | POA: Diagnosis not present

## 2024-02-17 DIAGNOSIS — E7800 Pure hypercholesterolemia, unspecified: Secondary | ICD-10-CM | POA: Insufficient documentation

## 2024-02-17 DIAGNOSIS — I471 Supraventricular tachycardia, unspecified: Secondary | ICD-10-CM | POA: Insufficient documentation

## 2024-02-17 DIAGNOSIS — I428 Other cardiomyopathies: Secondary | ICD-10-CM | POA: Insufficient documentation

## 2024-02-17 DIAGNOSIS — I25111 Atherosclerotic heart disease of native coronary artery with angina pectoris with documented spasm: Secondary | ICD-10-CM | POA: Insufficient documentation

## 2024-02-17 NOTE — Patient Instructions (Addendum)
 Thank you for choosing  [facility]!     Medication Instructions:  No medication changes were made during today's visit.   Start weight bearing upper and lower body activities to help prevent muscle wasting while taking the Ozempic.  *If you need a refill on your cardiac medications before your next appointment, please call your pharmacy*   Lab Work: FLP, LFT If you have labs (blood work) drawn today and your tests are completely normal, you will receive your results only by: MyChart Message (if you have MyChart) OR A paper copy in the mail If you have any lab test that is abnormal or we need to change your treatment, we will call you to review the results.   Testing/Procedures: Your physician has requested that you have an echocardiogram IN 4  to 6 WEEKS. Echocardiography is a painless test that uses sound waves to create images of your heart. It provides your doctor with information about the size and shape of your heart and how well your heart's chambers and valves are working. This procedure takes approximately one hour. There are no restrictions for this procedure. Please do NOT wear cologne, perfume, aftershave, or lotions (deodorant is allowed). Please arrive 15 minutes prior to your appointment time.  Please note: We ask at that you not bring children with you during ultrasound (echo/ vascular) testing. Due to room size and safety concerns, children are not allowed in the ultrasound rooms during exams. Our front office staff cannot provide observation of children in our lobby area while testing is being conducted. An adult accompanying a patient to their appointment will only be allowed in the ultrasound room at the discretion of the ultrasound technician under special circumstances. We apologize for any inconvenience.   Your next appointment:   4-6 month(s)   Provider:   Jerel Balding, MD or Josefa Beauvais     Follow-Up: At [facility], you and your  health needs are our priority.  As part of our continuing mission to provide you with exceptional heart care, we have created designated Provider Care Teams.  These Care Teams include your primary Cardiologist (physician) and Advanced Practice Providers (APPs -  Physician Assistants and Nurse Practitioners) who all work together to provide you with the care you need, when you need it. We recommend signing up for the patient portal called MyChart.  Sign up information is provided on this After Visit Summary.  MyChart is used to connect with patients for Virtual Visits (Telemedicine).  Patients are able to view lab/test results, encounter notes, upcoming appointments, etc.  Non-urgent messages can be sent to your provider as well.   To learn more about what you can do with MyChart, go to [email or web address].

## 2024-02-18 LAB — LIPID PANEL
Chol/HDL Ratio: 2 ratio (ref 0.0–4.4)
Cholesterol, Total: 151 mg/dL (ref 100–199)
HDL: 76 mg/dL (ref >39)
LDL Chol Calc (NIH): 58 mg/dL (ref 0–99)
Triglycerides: 90 mg/dL (ref 0–149)
VLDL Cholesterol Cal: 17 mg/dL (ref 5–40)

## 2024-02-18 LAB — HEPATIC FUNCTION PANEL
ALT: 18 IU/L (ref 0–32)
AST: 21 IU/L (ref 0–40)
Albumin: 4.3 g/dL (ref 3.8–4.9)
Alkaline Phosphatase: 85 IU/L (ref 49–135)
Bilirubin Total: 0.9 mg/dL (ref 0.0–1.2)
Bilirubin, Direct: 0.27 mg/dL (ref 0.00–0.40)
Total Protein: 6.6 g/dL (ref 6.0–8.5)

## 2024-02-19 ENCOUNTER — Other Ambulatory Visit: Payer: Self-pay | Admitting: General Practice

## 2024-02-20 ENCOUNTER — Ambulatory Visit: Payer: Self-pay | Admitting: General Practice

## 2024-02-27 ENCOUNTER — Other Ambulatory Visit: Payer: Self-pay | Admitting: Cardiovascular Disease

## 2024-02-27 ENCOUNTER — Other Ambulatory Visit: Payer: Self-pay | Admitting: General Practice

## 2024-03-11 ENCOUNTER — Ambulatory Visit: Payer: No Typology Code available for payment source

## 2024-03-11 DIAGNOSIS — I471 Supraventricular tachycardia, unspecified: Secondary | ICD-10-CM

## 2024-03-12 LAB — CUP PACEART REMOTE DEVICE CHECK
Battery Remaining Longevity: 71 mo
Battery Remaining Percentage: 77 %
Battery Voltage: 2.99 V
Brady Statistic AP VP Percent: 41 %
Brady Statistic AP VS Percent: 1 %
Brady Statistic AS VP Percent: 57 %
Brady Statistic AS VS Percent: 1 %
Brady Statistic RA Percent Paced: 40 %
Date Time Interrogation Session: 20251219020620
HighPow Impedance: 63 Ohm
Implantable Lead Connection Status: 753985
Implantable Lead Connection Status: 753985
Implantable Lead Implant Date: 20240229
Implantable Lead Implant Date: 20240229
Implantable Lead Location: 753858
Implantable Lead Location: 753860
Implantable Pulse Generator Implant Date: 20240229
Lead Channel Impedance Value: 380 Ohm
Lead Channel Impedance Value: 440 Ohm
Lead Channel Impedance Value: 940 Ohm
Lead Channel Pacing Threshold Amplitude: 1 V
Lead Channel Pacing Threshold Amplitude: 1 V
Lead Channel Pacing Threshold Amplitude: 1.5 V
Lead Channel Pacing Threshold Pulse Width: 0.5 ms
Lead Channel Pacing Threshold Pulse Width: 0.5 ms
Lead Channel Pacing Threshold Pulse Width: 0.5 ms
Lead Channel Sensing Intrinsic Amplitude: 12 mV
Lead Channel Sensing Intrinsic Amplitude: 5 mV
Lead Channel Setting Pacing Amplitude: 2 V
Lead Channel Setting Pacing Amplitude: 2 V
Lead Channel Setting Pacing Amplitude: 2.5 V
Lead Channel Setting Pacing Pulse Width: 0.5 ms
Lead Channel Setting Pacing Pulse Width: 0.5 ms
Lead Channel Setting Sensing Sensitivity: 0.5 mV
Pulse Gen Serial Number: 211015531
Zone Setting Status: 755011

## 2024-03-13 ENCOUNTER — Other Ambulatory Visit: Payer: Self-pay

## 2024-03-13 ENCOUNTER — Emergency Department (HOSPITAL_COMMUNITY)
Admission: EM | Admit: 2024-03-13 | Discharge: 2024-03-13 | Disposition: A | Discharge status: Home / Self Care | Attending: Emergency Medicine | Admitting: Emergency Medicine

## 2024-03-13 ENCOUNTER — Emergency Department (HOSPITAL_COMMUNITY)

## 2024-03-13 ENCOUNTER — Encounter (HOSPITAL_COMMUNITY): Payer: Self-pay

## 2024-03-13 DIAGNOSIS — Z9861 Coronary angioplasty status: Secondary | ICD-10-CM | POA: Insufficient documentation

## 2024-03-13 DIAGNOSIS — Z7982 Long term (current) use of aspirin: Secondary | ICD-10-CM | POA: Diagnosis not present

## 2024-03-13 DIAGNOSIS — R0789 Other chest pain: Secondary | ICD-10-CM | POA: Insufficient documentation

## 2024-03-13 DIAGNOSIS — I2510 Atherosclerotic heart disease of native coronary artery without angina pectoris: Secondary | ICD-10-CM | POA: Insufficient documentation

## 2024-03-13 DIAGNOSIS — I509 Heart failure, unspecified: Secondary | ICD-10-CM | POA: Insufficient documentation

## 2024-03-13 DIAGNOSIS — I110 Hypertensive heart disease with heart failure: Secondary | ICD-10-CM | POA: Insufficient documentation

## 2024-03-13 LAB — BASIC METABOLIC PANEL WITH GFR
Anion gap: 11 (ref 5–15)
BUN: 21 mg/dL — ABNORMAL HIGH (ref 6–20)
CO2: 23 mmol/L (ref 22–32)
Calcium: 9.7 mg/dL (ref 8.9–10.3)
Chloride: 108 mmol/L (ref 98–111)
Creatinine, Ser: 1.06 mg/dL — ABNORMAL HIGH (ref 0.44–1.00)
GFR, Estimated: >60 mL/min
Glucose, Bld: 104 mg/dL — ABNORMAL HIGH (ref 70–99)
Potassium: 4.3 mmol/L (ref 3.5–5.1)
Sodium: 141 mmol/L (ref 135–145)

## 2024-03-13 LAB — CBC
HCT: 46.7 % — ABNORMAL HIGH (ref 36.0–46.0)
Hemoglobin: 14.3 g/dL (ref 12.0–15.0)
MCH: 28.1 pg (ref 26.0–34.0)
MCHC: 30.6 g/dL (ref 30.0–36.0)
MCV: 91.7 fL (ref 80.0–100.0)
Platelets: 196 K/uL (ref 150–400)
RBC: 5.09 MIL/uL (ref 3.87–5.11)
RDW: 13.5 % (ref 11.5–15.5)
WBC: 6.8 K/uL (ref 4.0–10.5)
nRBC: 0 % (ref 0.0–0.2)

## 2024-03-13 LAB — TROPONIN T, HIGH SENSITIVITY
Troponin T High Sensitivity: <15 ng/L (ref 0–19)
Troponin T High Sensitivity: <15 ng/L (ref 0–19)

## 2024-03-13 MED ORDER — KETOROLAC TROMETHAMINE 15 MG/ML IJ SOLN
15.0000 mg | Freq: Once | INTRAMUSCULAR | Status: AC
  Administered 2024-03-13: 15 mg via INTRAVENOUS
  Filled 2024-03-13: qty 1

## 2024-03-13 NOTE — ED Triage Notes (Signed)
 Pt presents w/chest pain that started this morning, pain radiates to the back. Pt states she got lightheaded while driving to ER, Hx CHF- pt has a pacemaker. Denies N&V, SOB.

## 2024-03-13 NOTE — ED Provider Notes (Signed)
 " [place] EMERGENCY DEPARTMENT AT [facility] Provider Note   CSN: [id number] Arrival date & time: 03/13/24  1204     Patient presents with: Chest Pain   Bobbi Yount is a 58 y.o. female history of hypertension, hyperlipidemia, nonobstructive CAD, CHF, diabetes, ICD presents with complaints of chest pain that started this morning around 9:30.  States that she was at home doing chores.  Had some associated shortness of breath and dizziness.  No nausea.  Pain is constant.  Not exertional.  No significant URI symptoms.    Chest Pain     Past Medical History:  Diagnosis Date   Abnormal TSH    10/2011 - additional hormones pending   Anxiety    Asthma    Atypical chest pain    recent low risk myoview    Coronary artery spasm    a. NSTEMI 10/2011 with no CAD by cath but evidence of spasm when injecting left coronary artery.    Hyperlipidemia    Hypertension    LBBB (left bundle branch block)    Noncompliance    SVT (supraventricular tachycardia) 10/22/10   wide complex tachycardia of same QRS as intinsic LBBB   Past Surgical History:  Procedure Laterality Date   LEFT HEART CATHETERIZATION WITH CORONARY ANGIOGRAM N/A 11/20/2011   Procedure: LEFT HEART CATHETERIZATION WITH CORONARY ANGIOGRAM;  Surgeon: Ozell Fell, MD;  Location: [facility] CATH LAB;  Service: Cardiovascular;  Laterality: N/A;   RIGHT/LEFT HEART CATH AND CORONARY ANGIOGRAPHY N/A 01/08/2023   Procedure: RIGHT/LEFT HEART CATH AND CORONARY ANGIOGRAPHY;  Surgeon: Wendel Lurena POUR, MD;  Location: [facility] INVASIVE CV LAB;  Service: Cardiovascular;  Laterality: N/A;   TUBAL LIGATION       Prior to Admission medications  Medication Sig Start Date End Date Taking? Authorizing Provider  albuterol  (PROVENTIL  HFA;VENTOLIN  HFA) 108 (90 BASE) MCG/ACT inhaler Inhale 2 puffs into the lungs every 6 (six) hours as needed for wheezing or shortness of breath. For wheezing    [provider]  amiodarone  (PACERONE ) 200  MG tablet Take 1 tablet (200 mg total) by mouth daily. Patient not taking: Reported on 02/17/2024 09/10/22   Croitoru, Jerel, MD  aspirin  EC 81 MG tablet Take 81 mg by mouth daily. 04/22/22   [provider]  atorvastatin  (LIPITOR) 80 MG tablet Take 80 mg by mouth at bedtime. Patient not taking: Reported on 08/18/2023    [provider]  bisoprolol  (ZEBETA ) 5 MG tablet Take 1 tablet (5 mg total) by mouth daily. 04/30/23   Croitoru, Mihai, MD  chlorpheniramine-HYDROcodone  (TUSSIONEX) 10-8 MG/5ML Take 5 mLs by mouth at bedtime as needed for cough. Patient not taking: Reported on 08/18/2023 06/11/23   Prosperi, Christian H, PA-C  cyclobenzaprine  (FLEXERIL ) 10 MG tablet Take 0.5-1 tablets (5-10 mg total) by mouth 2 (two) times daily as needed for muscle spasms. Patient not taking: Reported on 08/18/2023 04/08/23   Harris, Abigail, PA-C  dapagliflozin  propanediol (FARXIGA ) 10 MG TABS tablet Take 1 tablet (10 mg total) by mouth daily before breakfast. 01/09/23   Sheikh, Omair Latif, DO  dapagliflozin  propanediol (FARXIGA ) 10 MG TABS tablet TAKE 1 TABLET BY MOUTH ONCE DAILY BEFORE BREAKFAST 03/01/24   Emelia Josefa HERO, NP  ezetimibe  (ZETIA ) 10 MG tablet Take 1 tablet (10 mg total) by mouth daily. 01/10/23   Sherrill Cable Latif, DO  ferrous sulfate  325 (65 FE) MG tablet Take 1 tablet (325 mg total) by mouth every morning. Patient taking differently: Take 325 mg by mouth  daily. 01/10/23   Sheikh, Alejandro Latif, DO  gabapentin  (NEURONTIN ) 400 MG capsule Take 1 capsule by mouth 2 (two) times daily. 04/22/23   [provider]  isosorbide  mononitrate (IMDUR ) 60 MG 24 hr tablet Take 1 tablet (60 mg total) by mouth daily. 01/09/23   Sherrill Alejandro Latif, DO  nitroGLYCERIN  (NITROSTAT ) 0.4 MG SL tablet Place 0.4 mg under the tongue every 5 (five) minutes as needed for chest pain. Patient not taking: Reported on 02/17/2024    [provider]  sacubitril -valsartan  (ENTRESTO ) 97-103 MG Take 1  tablet by mouth twice daily 03/01/24   Cleaver, Jesse M, NP  solifenacin (VESICARE) 5 MG tablet Take 1 tablet by mouth daily. 12/23/22   [provider]    Allergies: Celebrex  [celecoxib ], Amoxicillin, Penicillins, Shellfish allergy, and Shellfish protein-containing drug products    Review of Systems  Cardiovascular:  Positive for chest pain.    Updated Vital Signs BP (!) 153/109 (BP Location: Right Arm)   Pulse 63   Temp 98 F (36.7 C) (Oral)   Resp 16   Ht 5' 1 (1.549 m)   Wt 88.6 kg   SpO2 100%   BMI 36.91 kg/m   Physical Exam Vitals and nursing note reviewed.  Constitutional:      General: She is not in acute distress.    Appearance: She is well-developed.  HENT:     Head: Normocephalic and atraumatic.  Eyes:     Conjunctiva/sclera: Conjunctivae normal.  Cardiovascular:     Rate and Rhythm: Normal rate and regular rhythm.     Heart sounds: No murmur heard. Pulmonary:     Effort: Pulmonary effort is normal. No respiratory distress.     Breath sounds: Normal breath sounds.  Abdominal:     Palpations: Abdomen is soft.     Tenderness: There is no abdominal tenderness.  Musculoskeletal:        General: No swelling.     Cervical back: Neck supple.     Comments: Tenderness to left thoracic paraspinal region and anterior chest wall  Skin:    General: Skin is warm and dry.     Capillary Refill: Capillary refill takes less than 2 seconds.  Neurological:     Mental Status: She is alert.  Psychiatric:        Mood and Affect: Mood normal.     (all labs ordered are listed, but only abnormal results are displayed) Labs Reviewed  BASIC METABOLIC PANEL WITH GFR - Abnormal; Notable for the following components:      Result Value   Glucose, Bld 104 (*)    BUN 21 (*)    Creatinine, Ser 1.06 (*)    All other components within normal limits  CBC - Abnormal; Notable for the following components:   HCT 46.7 (*)    All other components within normal limits   TROPONIN T, HIGH SENSITIVITY  TROPONIN T, HIGH SENSITIVITY    EKG: EKG Interpretation Date/Time:  Sunday March 13 2024 12:12:59 EST Ventricular Rate:  65 PR Interval:  178 QRS Duration:  136 QT Interval:  477 QTC Calculation: 496 R Axis:   76  Text Interpretation: Sinus rhythm Probable left atrial enlargement IVCD, consider atypical LBBB Confirmed by Darra Chew [phone]) on 03/13/2024 12:16:34 PM  Radiology: [facility] Chest 2 View Result Date: 03/13/2024 EXAM: 2 VIEW(S) XRAY OF THE CHEST 03/13/2024 12:49:39 PM COMPARISON: None available. CLINICAL HISTORY: Chest pain Chest pain FINDINGS: LUNGS AND PLEURA: No focal pulmonary opacity. No pleural effusion.  No pneumothorax. HEART AND MEDIASTINUM: No acute abnormality of the cardiac and mediastinal silhouettes. BONES AND SOFT TISSUES: No acute osseous abnormality. IMPRESSION: 1. No acute process. Electronically signed by: Evalene Coho MD 03/13/2024 12:52 PM EST RP Workstation: [id number]     Procedures   Medications Ordered in the ED  ketorolac  (TORADOL ) 15 MG/ML injection 15 mg (has no administration in time range)    Clinical Course as of 03/13/24 1656  Sun Mar 13, 2024  1653 Patient with history of multiple cardiovascular risk factors evaluated with complaints of nonexertional or nonpleuritic chest pain that started earlier this morning.  Upon arrival patient is hypertensive otherwise hemodynamically stable.  On exam she does have reproducible tenderness to her chest and left paraspinal region.  Lung sounds are clear.  Cardiac workup is overall reassuring.  Patient will be discharged home with strict return precautions and close PCP follow-up.  Patient is understanding and agreement with plan [JT]    Clinical Course User Index [JT] Donnajean Lynwood DEL, PA-C                                 Medical Decision Making Amount and/or Complexity of Data Reviewed Labs: ordered. Radiology: ordered.  Risk Prescription drug  management.   This patient presents to the ED with chief complaint(s) of chest pain.  The complaint involves an extensive differential diagnosis and also carries with it a high risk of complications and morbidity.   Pertinent past medical history as listed in HPI  The differential diagnosis includes  Do not suspect ACS as patient is without any EKG changes or troponin elevation.  Additionally has reproducible chest wall tenderness.  Has no PE risk factors and is not tachycardic or tachypneic.  Low suspicion for dissection based off exam and history as well.  Additionally he appears to have similar presentations in the past with reassuring workup. Additional history obtained: Records reviewed Care Everywhere/External Records  Disposition:   Patient will be discharged home. The patient has been appropriately medically screened and/or stabilized in the ED. I have low suspicion for any other emergent medical condition which would require further screening, evaluation or treatment in the ED or require inpatient management. At time of discharge the patient is hemodynamically stable and in no acute distress. I have discussed work-up results and diagnosis with patient and answered all questions. Patient is agreeable with discharge plan. We discussed strict return precautions for returning to the emergency department and they verbalized understanding.     Social Determinants of Health:   none  This note was dictated with voice recognition software.  Despite best efforts at proofreading, errors may have occurred which can change the documentation meaning.       Final diagnoses:  Atypical chest pain    ED Discharge Orders     None          Donnajean Lynwood DEL DEVONNA 03/13/24 1656    Patsey Lot, MD 03/13/24 2248  "

## 2024-03-13 NOTE — Discharge Instructions (Signed)
 You were evaluated emergency room for chest pain.  Your lab work and imaging did not show any significant normality.  You may use Tylenol  and ibuprofen  as this very well may be musculoskeletal pain.  Please follow with your primary care doctor should symptoms persist.  If you experience any new or worsening symptoms please return to emergency room.

## 2024-03-14 NOTE — Progress Notes (Signed)
 Remote ICD Transmission

## 2024-03-19 ENCOUNTER — Ambulatory Visit: Payer: Self-pay | Admitting: Cardiovascular Disease

## 2024-03-31 ENCOUNTER — Ambulatory Visit (HOSPITAL_COMMUNITY)
Admission: RE | Admit: 2024-03-31 | Discharge: 2024-03-31 | Disposition: A | Discharge status: Home / Self Care | Payer: Self-pay | Source: Ambulatory Visit | Attending: General Practice | Admitting: General Practice

## 2024-03-31 DIAGNOSIS — I428 Other cardiomyopathies: Secondary | ICD-10-CM

## 2024-03-31 LAB — ECHOCARDIOGRAM COMPLETE
AR max vel: 2.69 cm2
AV Area VTI: 2.48 cm2
AV Area mean vel: 2.42 cm2
AV Mean grad: 3 mmHg
AV Peak grad: 4.7 mmHg
Ao pk vel: 1.08 m/s
Area-P 1/2: 2.7 cm2
S' Lateral: 5.18 cm

## 2024-06-06 ENCOUNTER — Ambulatory Visit: Admitting: Cardiovascular Disease
